# Patient Record
Sex: Female | Born: 1960 | Race: White | Hispanic: No | Marital: Married | Smoking: Former smoker
Health system: Southern US, Community
[De-identification: ages and names within clinical notes are randomized; demographics above are authoritative.]

## PROBLEM LIST (undated history)

## (undated) DIAGNOSIS — F32A Depression, unspecified: Secondary | ICD-10-CM

## (undated) DIAGNOSIS — M5126 Other intervertebral disc displacement, lumbar region: Secondary | ICD-10-CM

## (undated) DIAGNOSIS — F410 Panic disorder [episodic paroxysmal anxiety] without agoraphobia: Secondary | ICD-10-CM

## (undated) DIAGNOSIS — E785 Hyperlipidemia, unspecified: Secondary | ICD-10-CM

## (undated) DIAGNOSIS — E039 Hypothyroidism, unspecified: Secondary | ICD-10-CM

## (undated) DIAGNOSIS — D649 Anemia, unspecified: Secondary | ICD-10-CM

## (undated) DIAGNOSIS — K52831 Collagenous colitis: Secondary | ICD-10-CM

## (undated) DIAGNOSIS — F419 Anxiety disorder, unspecified: Secondary | ICD-10-CM

## (undated) DIAGNOSIS — G4700 Insomnia, unspecified: Secondary | ICD-10-CM

## (undated) DIAGNOSIS — I10 Essential (primary) hypertension: Secondary | ICD-10-CM

## (undated) DIAGNOSIS — R011 Cardiac murmur, unspecified: Secondary | ICD-10-CM

## (undated) HISTORY — DX: Hyperlipidemia, unspecified: E78.5

## (undated) HISTORY — DX: Depression, unspecified: F32.A

## (undated) HISTORY — DX: Collagenous colitis: K52.831

## (undated) HISTORY — DX: Other intervertebral disc displacement, lumbar region: M51.26

## (undated) HISTORY — DX: Anxiety disorder, unspecified: F41.9

## (undated) HISTORY — DX: Essential (primary) hypertension: I10

## (undated) HISTORY — DX: Anemia, unspecified: D64.9

## (undated) HISTORY — DX: Panic disorder (episodic paroxysmal anxiety): F41.0

## (undated) HISTORY — PX: BACK SURGERY: SHX140

## (undated) HISTORY — PX: HERNIA REPAIR: SHX51

## (undated) HISTORY — PX: TUBAL LIGATION: SHX77

## (undated) HISTORY — DX: Hypothyroidism, unspecified: E03.9

## (undated) HISTORY — DX: Insomnia, unspecified: G47.00

## (undated) HISTORY — DX: Cardiac murmur, unspecified: R01.1

---

## 1998-02-17 ENCOUNTER — Other Ambulatory Visit: Admission: RE | Admit: 1998-02-17 | Discharge: 1998-02-17 | Payer: Self-pay | Admitting: Obstetrics and Gynecology

## 1999-02-04 ENCOUNTER — Other Ambulatory Visit: Admission: RE | Admit: 1999-02-04 | Discharge: 1999-02-04 | Payer: Self-pay | Admitting: Internal Medicine

## 1999-02-08 ENCOUNTER — Encounter: Payer: Self-pay | Admitting: Emergency Medicine

## 1999-02-08 ENCOUNTER — Emergency Department (HOSPITAL_COMMUNITY): Admission: EM | Admit: 1999-02-08 | Discharge: 1999-02-08 | Payer: Self-pay | Admitting: Emergency Medicine

## 1999-08-16 ENCOUNTER — Other Ambulatory Visit: Admission: RE | Admit: 1999-08-16 | Discharge: 1999-08-16 | Payer: Self-pay | Admitting: Internal Medicine

## 2000-02-09 ENCOUNTER — Other Ambulatory Visit: Admission: RE | Admit: 2000-02-09 | Discharge: 2000-02-09 | Payer: Self-pay | Admitting: Internal Medicine

## 2000-05-16 ENCOUNTER — Ambulatory Visit (HOSPITAL_COMMUNITY): Admission: RE | Admit: 2000-05-16 | Discharge: 2000-05-16 | Payer: Self-pay | Admitting: Obstetrics and Gynecology

## 2001-02-18 ENCOUNTER — Other Ambulatory Visit: Admission: RE | Admit: 2001-02-18 | Discharge: 2001-02-18 | Payer: Self-pay | Admitting: Internal Medicine

## 2001-02-27 ENCOUNTER — Encounter: Admission: RE | Admit: 2001-02-27 | Discharge: 2001-02-27 | Payer: Self-pay | Admitting: Internal Medicine

## 2001-02-27 ENCOUNTER — Encounter: Payer: Self-pay | Admitting: Internal Medicine

## 2002-02-14 ENCOUNTER — Encounter: Payer: Self-pay | Admitting: Internal Medicine

## 2002-02-14 ENCOUNTER — Encounter: Admission: RE | Admit: 2002-02-14 | Discharge: 2002-02-14 | Payer: Self-pay | Admitting: Internal Medicine

## 2002-03-06 ENCOUNTER — Encounter: Admission: RE | Admit: 2002-03-06 | Discharge: 2002-03-06 | Payer: Self-pay | Admitting: Internal Medicine

## 2002-03-06 ENCOUNTER — Encounter: Payer: Self-pay | Admitting: Internal Medicine

## 2003-03-18 ENCOUNTER — Encounter: Payer: Self-pay | Admitting: Internal Medicine

## 2003-03-18 ENCOUNTER — Encounter: Admission: RE | Admit: 2003-03-18 | Discharge: 2003-03-18 | Payer: Self-pay | Admitting: Internal Medicine

## 2004-03-23 ENCOUNTER — Encounter: Admission: RE | Admit: 2004-03-23 | Discharge: 2004-03-23 | Payer: Self-pay | Admitting: Internal Medicine

## 2005-03-24 ENCOUNTER — Encounter: Admission: RE | Admit: 2005-03-24 | Discharge: 2005-03-24 | Payer: Self-pay | Admitting: Internal Medicine

## 2006-04-06 ENCOUNTER — Encounter: Admission: RE | Admit: 2006-04-06 | Discharge: 2006-04-06 | Payer: Self-pay | Admitting: Internal Medicine

## 2006-07-09 ENCOUNTER — Ambulatory Visit (HOSPITAL_COMMUNITY): Admission: RE | Admit: 2006-07-09 | Discharge: 2006-07-10 | Payer: Self-pay | Admitting: Neurosurgery

## 2007-04-09 ENCOUNTER — Encounter: Admission: RE | Admit: 2007-04-09 | Discharge: 2007-04-09 | Payer: Self-pay | Admitting: Internal Medicine

## 2008-04-09 ENCOUNTER — Encounter: Admission: RE | Admit: 2008-04-09 | Discharge: 2008-04-09 | Payer: Self-pay | Admitting: Internal Medicine

## 2008-07-11 IMAGING — CR DG CHEST 2V
2 series · 2 of 2 positions shown · non-contrast
Comparison: None available.

CLINICAL DATA: HNP and radiculopathy.  
 CHEST - 2 VIEW:

[view not recorded (1 of 2)]
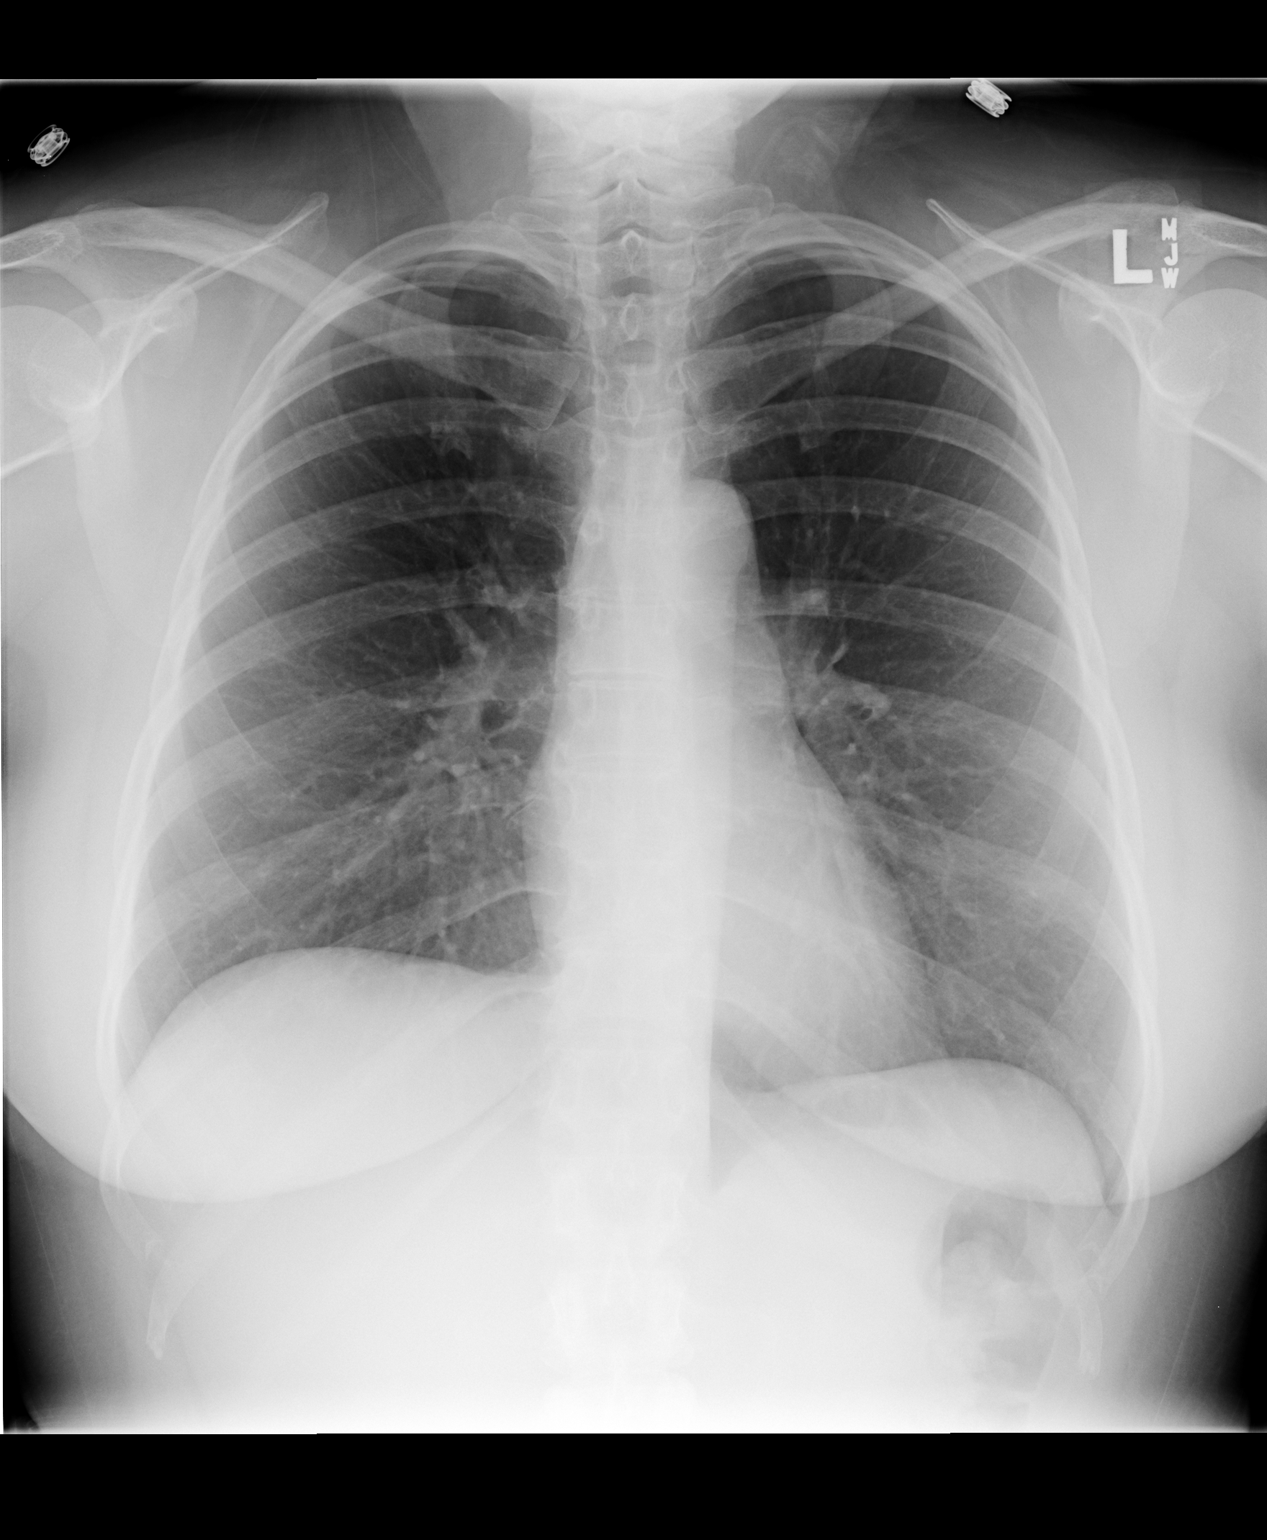

[view not recorded (2 of 2)]
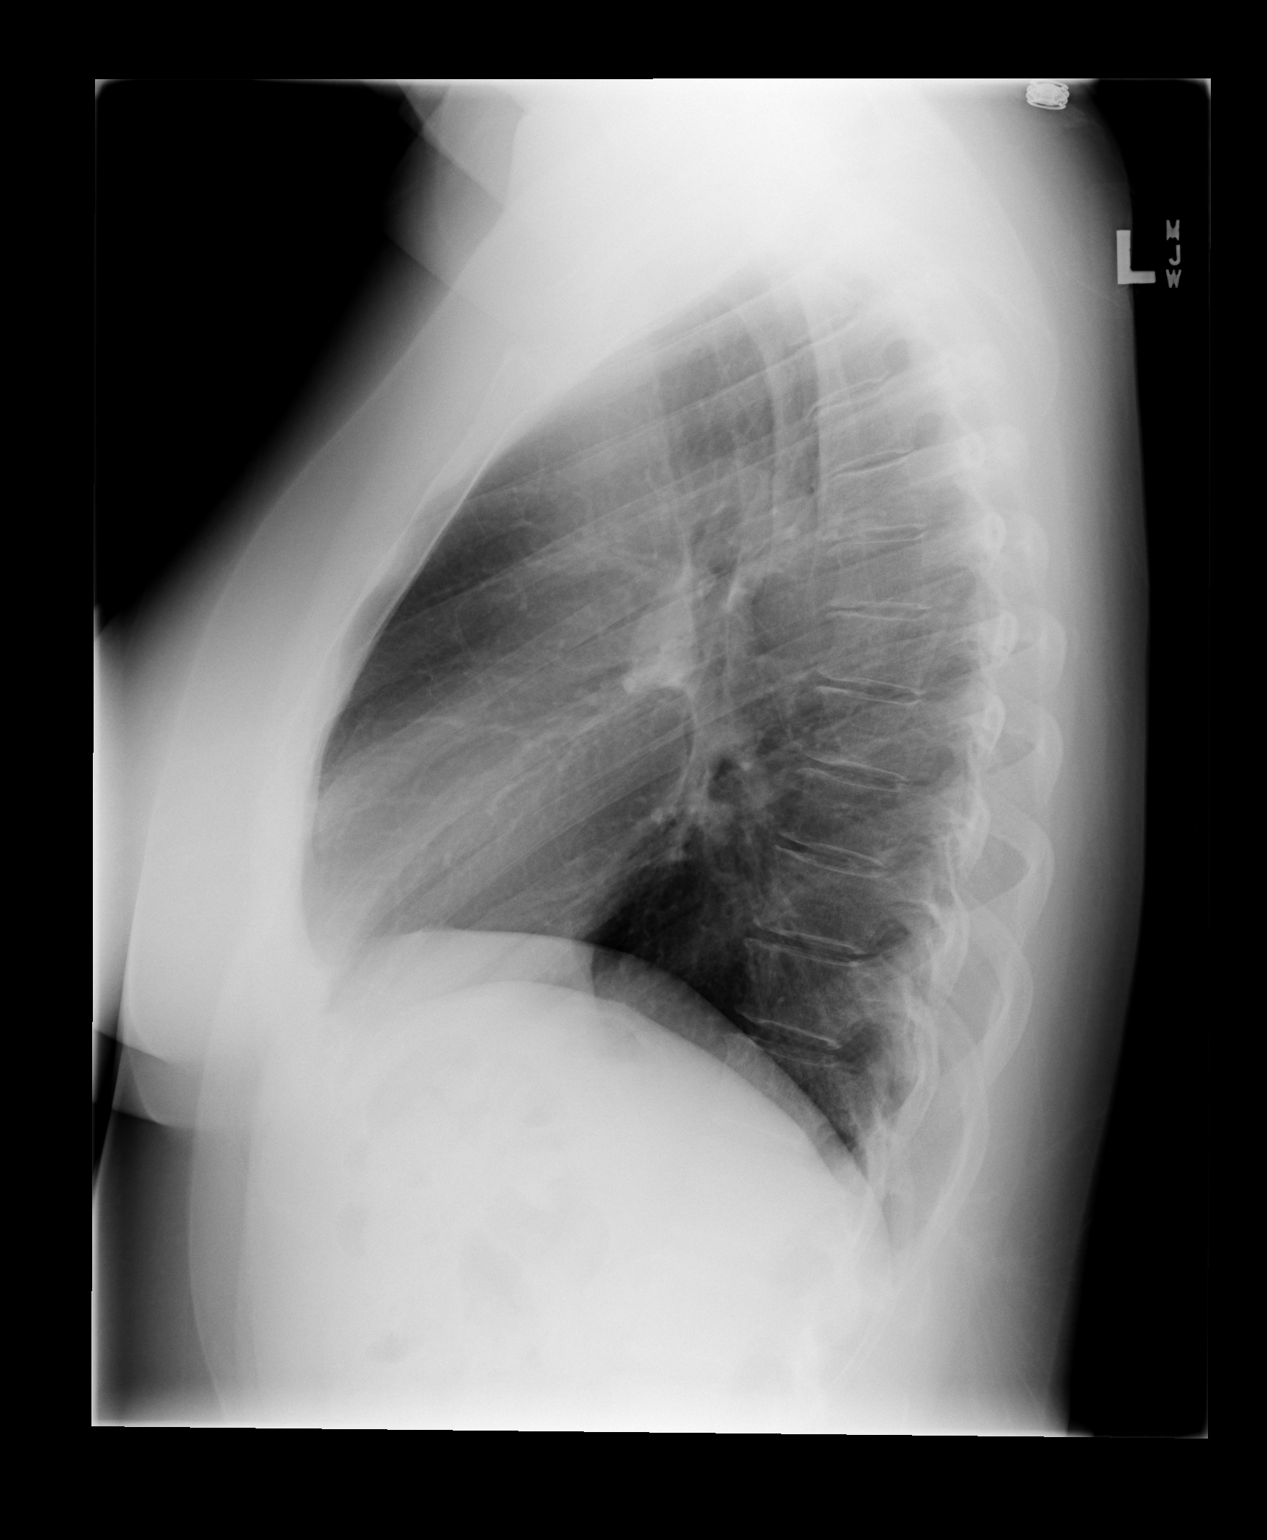

[2 of 2 positions shown; findings below may reference images not displayed]

FINDINGS: The heart size is normal.  There is no effusion or edema.  No airspace opacities are identified.
IMPRESSION: No active disease.

## 2009-04-20 ENCOUNTER — Encounter: Admission: RE | Admit: 2009-04-20 | Discharge: 2009-04-20 | Payer: Self-pay | Admitting: Internal Medicine

## 2010-11-11 NOTE — Op Note (Signed)
[facility]  Patient:    Jenny Yates, Jenny Yates                       MRN: [id number] Proc. Date: 05/16/00 Adm. Date:  [id number] Attending:  Maxie Better                           Operative Report  PREOPERATIVE DIAGNOSIS:         Desires permanent sterilization.  POSTOPERATIVE DIAGNOSIS:        Desires permanent sterilization.  OPERATION:                      Laparoscopic tubal ligation with bipolar cautery.  SURGEON:                        Sheronette A. Cherly Hensen, M.D.  ANESTHESIA:                     General.  INDICATIONS:                    This is a 50 year old G0 married white female with chronic hypertension who now desires permanent sterilization.  Risks and benefits of the procedure have been explained to the patient and consent was signed.  The patient was transferred to the operating room.  DESCRIPTION OF PROCEDURE:       Under adequate general anesthesia, the patient was placed in the dorsal lithotomy position.  Examination under anesthesia revealed a small anteverted uterus.  No adnexal masses could be appreciated. The patient was sterilely prepped and draped in the usual sterile fashion. Hibiclens was used due to the patients allergy to iodine.  Bivalve speculum was placed in the vagina.  Single-tooth tenaculum was placed in the anterior lip of the cervix.  A Kohn cannula was introduced into the cervical os and attached to the tenaculum for manipulation of the uterus.  The bivalve speculum was removed.  Attention was then turned to the abdomen where a small vertical infraumbilical incision was then made.  The Verres needle was introduced.  It was flushed with normal saline.  Opening pressure of 9 was noted.  Three liters of CO2 was insufflated.  The Verres needle was then removed.  A 10 mm trocar was introduced into the abdomen without incident. The patient was placed in Trendelenburg.  A suprapubic incision was then made and under  direct visualization a 5 mm port was introduced.  Using the probe the pelvis was inspected.  No evidence of endometriosis was noted in the anterior posterior cul-de-sac.  Both tubes were normal.  Ovaries were both normal.  The uterus was normal.  Appendix was noted to be normal.  Normal liver edge was noted.  Through the second port, bipolar cautery was then placed and the mid portion of the right fallopian tube was cauterized for a level of 1.5-2.0 cm.  This same procedure was performed on the contralateral side after identifying the fallopian end of each tube bilaterally.  The procedure was then terminated by removing the suprapubic site, deflating the abdomen and removing the infraumbilical site under direct visualization.  The skin was injected with 0.25% Marcaine and proximally using Dermabond glue. The instruments in the vagina were also removed.  Bivalve speculum was reinserted in the vagina.  The cervix was inspected and pressure was put on the tenaculum site and good  hemostasis subsequently noted, all instruments in the vagina were removed.  Specimens were none.  Estimated blood loss was minimal.  Complications were none.  The patient tolerated the procedure well and was transferred to the recovery room in stable condition. DD:  05/16/00 TD:  05/17/00 Job: [id number]

## 2010-11-11 NOTE — Op Note (Signed)
Jenny Yates, Jenny Yates NO.:  [id number]   MEDICAL RECORD NO.:  [id number]          PATIENT TYPE:  OIB   LOCATION:  3014                         FACILITY:  [facility]   PHYSICIAN:  Cristi Loron, M.D.DATE OF BIRTH:  29-Apr-1961   DATE OF PROCEDURE:  07/09/2006  DATE OF DISCHARGE:  07/10/2006                               OPERATIVE REPORT   BRIEF HISTORY AND PHYSICAL:  The patient is a 50 year old white female  who has suffered from back and left flank pain consistent with the left  S1 radiculopathy.  She failed medical management.  Was worked up with a  lumbar MRI, which has demonstrated a herniated disk at L5-S1 on the  left.  I discussed various treatment options with the patient including  surgery.  The patient has been explained the risks, benefits, and  alternative to surgery.  I proceeded with a L5-S1 microdiskectomy.   PREOPERATIVE DIAGNOSES:  1. Left L5-S1 herniated nucleus pulposis.  2. Lumbar radiculopathy lumbago.  3. Degenerative disk disease.   POSTOPERATIVE DIAGNOSES:  1. Left L5-S1 herniated nucleus pulposis.  2. Lumbar radiculopathy lumbago.  3. Degenerative disk disease.   PROCEDURE:  Left L5-S1 microdiskectomy using microdissection.   SURGEON:  Tressie Stalker, M.D.   ASSISTANT:  Hilda Lias, M.D.   ANESTHESIA:  General endotracheal.   ESTIMATED BLOOD LOSS:  Minimal.   SPECIMENS:  None.   DRAINS:  None.   COMPLICATIONS:  None.   DESCRIPTION OF PROCEDURE:  The patient was brought to the operating room  by the anesthesia team.  General endotracheal anesthesia was induced.  The patient was then carefully turned to the prone position on the  Wilson frame.  The lumbosacral region was prepared with a Hibiclens  solution, apparently the patient is allergic to iodine.  Sterile drapes  were applied.  I then injected the area with Marcaine with epinephrine  solution.  I used a scalpel to make a linear midline incision over the  L5-S1  nerve space.  I used electrocautery to perform a left-sided  subperiosteal resection, exposing the left spinous process lamina of L5  near the upper sacral.  I then obtained an intraoperative radiograph to  confirm our location.   We then inserted the [facility] retractor for exposure and then brought  the intraoperative microscope into the field and under its magnification  and illumination completed the microdissection/decompression.  We used a  high speed drill to perform a left L5 laminotomy.  I widened the lamina  with a Kerrison punch and removed the left L5-S1 ligamentum flavum .  Also removed cephalad left S1 lamina.  I performed a foraminotomy about  the left S1 nerve root.  We then used microdissection to dissect the  epidural tissue and then Dr.  Jeral Fruit gently retracted the left S1 nerve  root medially with a erve root  retractor to expose a disk herniation,  which had migrated caudally over the upper S1 vertebral body, it was  compressing the left S1 nerve root into the neuroforamen.  We used  microdissection to free up this disk herniation and removed it  in  several fragments using the pituitary forceps.  We then inspected the L5-  S1 intravertebral disk and there was a hole in the annulus, but there  did not appear to be any impending herniations.  We therefore, did not  enter into the intravertebral disk space.  We obtained hemostasis with  bipolar electrocautery.  I palpated on the ventral surface of the fecal  sac along the exit route  of the left S1 nerve root and noted the neural  structures were well decompressed.  We then removed the [facility]  retractor, then re-approximated the lumbar fascia with a #1 Vicryl  suture, subcutaneous tissue with 2-0 Vicryl sutures, and the skin with  Steri-Strips and Benzoin.  The wound was then covered with a sterile  dressing was applied.  The drapes were removed.  The patient was  subsequently returned to the supine position where  she was extubated by  the anesthesia team and transported to post anesthesia care unit in  stable condition.  All sponge, instrument, and needle counts were  correct at the end of this case.      Cristi Loron, M.D.  Electronically Signed     JDJ/MEDQ  D:  07/09/2006  T:  07/10/2006  Job:  [id number]

## 2015-11-19 DIAGNOSIS — Z1231 Encounter for screening mammogram for malignant neoplasm of breast: Secondary | ICD-10-CM | POA: Diagnosis not present

## 2016-05-01 DIAGNOSIS — Z1283 Encounter for screening for malignant neoplasm of skin: Secondary | ICD-10-CM | POA: Diagnosis not present

## 2016-05-01 DIAGNOSIS — L820 Inflamed seborrheic keratosis: Secondary | ICD-10-CM | POA: Diagnosis not present

## 2016-05-05 DIAGNOSIS — Z1389 Encounter for screening for other disorder: Secondary | ICD-10-CM | POA: Diagnosis not present

## 2016-05-05 DIAGNOSIS — Z6836 Body mass index (BMI) 36.0-36.9, adult: Secondary | ICD-10-CM | POA: Diagnosis not present

## 2016-05-05 DIAGNOSIS — R05 Cough: Secondary | ICD-10-CM | POA: Diagnosis not present

## 2016-05-05 DIAGNOSIS — Z0000 Encounter for general adult medical examination without abnormal findings: Secondary | ICD-10-CM | POA: Diagnosis not present

## 2016-05-05 DIAGNOSIS — R875 Abnormal microbiological findings in specimens from female genital organs: Secondary | ICD-10-CM | POA: Diagnosis not present

## 2016-05-05 DIAGNOSIS — Z5181 Encounter for therapeutic drug level monitoring: Secondary | ICD-10-CM | POA: Diagnosis not present

## 2016-05-05 DIAGNOSIS — Z79899 Other long term (current) drug therapy: Secondary | ICD-10-CM | POA: Diagnosis not present

## 2016-05-05 DIAGNOSIS — E7800 Pure hypercholesterolemia, unspecified: Secondary | ICD-10-CM | POA: Diagnosis not present

## 2016-05-05 DIAGNOSIS — I10 Essential (primary) hypertension: Secondary | ICD-10-CM | POA: Diagnosis not present

## 2016-05-05 DIAGNOSIS — G894 Chronic pain syndrome: Secondary | ICD-10-CM | POA: Diagnosis not present

## 2016-05-05 DIAGNOSIS — E039 Hypothyroidism, unspecified: Secondary | ICD-10-CM | POA: Diagnosis not present

## 2016-05-05 DIAGNOSIS — Z23 Encounter for immunization: Secondary | ICD-10-CM | POA: Diagnosis not present

## 2016-05-05 DIAGNOSIS — Z01419 Encounter for gynecological examination (general) (routine) without abnormal findings: Secondary | ICD-10-CM | POA: Diagnosis not present

## 2016-05-12 DIAGNOSIS — R7309 Other abnormal glucose: Secondary | ICD-10-CM | POA: Diagnosis not present

## 2017-07-02 DIAGNOSIS — R197 Diarrhea, unspecified: Secondary | ICD-10-CM | POA: Diagnosis not present

## 2017-07-02 DIAGNOSIS — E860 Dehydration: Secondary | ICD-10-CM | POA: Diagnosis not present

## 2017-07-02 DIAGNOSIS — R112 Nausea with vomiting, unspecified: Secondary | ICD-10-CM | POA: Diagnosis not present

## 2018-06-12 DIAGNOSIS — T148XXA Other injury of unspecified body region, initial encounter: Secondary | ICD-10-CM | POA: Diagnosis not present

## 2018-06-12 DIAGNOSIS — F411 Generalized anxiety disorder: Secondary | ICD-10-CM | POA: Diagnosis not present

## 2018-06-12 DIAGNOSIS — I10 Essential (primary) hypertension: Secondary | ICD-10-CM | POA: Diagnosis not present

## 2018-06-12 DIAGNOSIS — G894 Chronic pain syndrome: Secondary | ICD-10-CM | POA: Diagnosis not present

## 2018-12-13 DIAGNOSIS — E7800 Pure hypercholesterolemia, unspecified: Secondary | ICD-10-CM | POA: Diagnosis not present

## 2018-12-13 DIAGNOSIS — I10 Essential (primary) hypertension: Secondary | ICD-10-CM | POA: Diagnosis not present

## 2018-12-13 DIAGNOSIS — Z13220 Encounter for screening for lipoid disorders: Secondary | ICD-10-CM | POA: Diagnosis not present

## 2018-12-13 DIAGNOSIS — Z0000 Encounter for general adult medical examination without abnormal findings: Secondary | ICD-10-CM | POA: Diagnosis not present

## 2018-12-13 DIAGNOSIS — Z8742 Personal history of other diseases of the female genital tract: Secondary | ICD-10-CM | POA: Diagnosis not present

## 2018-12-13 DIAGNOSIS — E039 Hypothyroidism, unspecified: Secondary | ICD-10-CM | POA: Diagnosis not present

## 2018-12-13 DIAGNOSIS — Z1331 Encounter for screening for depression: Secondary | ICD-10-CM | POA: Diagnosis not present

## 2018-12-19 DIAGNOSIS — Z1231 Encounter for screening mammogram for malignant neoplasm of breast: Secondary | ICD-10-CM | POA: Diagnosis not present

## 2019-02-26 DIAGNOSIS — I10 Essential (primary) hypertension: Secondary | ICD-10-CM | POA: Diagnosis not present

## 2019-05-29 DIAGNOSIS — I10 Essential (primary) hypertension: Secondary | ICD-10-CM | POA: Diagnosis not present

## 2019-05-29 DIAGNOSIS — E7800 Pure hypercholesterolemia, unspecified: Secondary | ICD-10-CM | POA: Diagnosis not present

## 2019-05-29 DIAGNOSIS — G894 Chronic pain syndrome: Secondary | ICD-10-CM | POA: Diagnosis not present

## 2019-06-06 DIAGNOSIS — K6289 Other specified diseases of anus and rectum: Secondary | ICD-10-CM | POA: Diagnosis not present

## 2019-06-06 DIAGNOSIS — Z1211 Encounter for screening for malignant neoplasm of colon: Secondary | ICD-10-CM | POA: Diagnosis not present

## 2019-06-06 DIAGNOSIS — D122 Benign neoplasm of ascending colon: Secondary | ICD-10-CM | POA: Diagnosis not present

## 2019-06-06 DIAGNOSIS — K621 Rectal polyp: Secondary | ICD-10-CM | POA: Diagnosis not present

## 2019-06-06 DIAGNOSIS — K635 Polyp of colon: Secondary | ICD-10-CM | POA: Diagnosis not present

## 2021-09-14 DIAGNOSIS — D225 Melanocytic nevi of trunk: Secondary | ICD-10-CM | POA: Diagnosis not present

## 2021-09-14 DIAGNOSIS — L570 Actinic keratosis: Secondary | ICD-10-CM | POA: Diagnosis not present

## 2021-09-14 DIAGNOSIS — D2239 Melanocytic nevi of other parts of face: Secondary | ICD-10-CM | POA: Diagnosis not present

## 2021-09-14 DIAGNOSIS — L820 Inflamed seborrheic keratosis: Secondary | ICD-10-CM | POA: Diagnosis not present

## 2021-09-14 DIAGNOSIS — L814 Other melanin hyperpigmentation: Secondary | ICD-10-CM | POA: Diagnosis not present

## 2021-10-03 DIAGNOSIS — I10 Essential (primary) hypertension: Secondary | ICD-10-CM | POA: Diagnosis not present

## 2021-10-03 DIAGNOSIS — E7800 Pure hypercholesterolemia, unspecified: Secondary | ICD-10-CM | POA: Diagnosis not present

## 2021-10-06 DIAGNOSIS — L0390 Cellulitis, unspecified: Secondary | ICD-10-CM | POA: Diagnosis not present

## 2021-10-10 DIAGNOSIS — L0390 Cellulitis, unspecified: Secondary | ICD-10-CM | POA: Diagnosis not present

## 2022-01-19 DIAGNOSIS — Z973 Presence of spectacles and contact lenses: Secondary | ICD-10-CM | POA: Diagnosis not present

## 2022-01-19 DIAGNOSIS — H25813 Combined forms of age-related cataract, bilateral: Secondary | ICD-10-CM | POA: Diagnosis not present

## 2022-01-19 DIAGNOSIS — H4322 Crystalline deposits in vitreous body, left eye: Secondary | ICD-10-CM | POA: Diagnosis not present

## 2022-01-19 DIAGNOSIS — H02831 Dermatochalasis of right upper eyelid: Secondary | ICD-10-CM | POA: Diagnosis not present

## 2022-01-19 DIAGNOSIS — H02834 Dermatochalasis of left upper eyelid: Secondary | ICD-10-CM | POA: Diagnosis not present

## 2022-01-23 DIAGNOSIS — S81801A Unspecified open wound, right lower leg, initial encounter: Secondary | ICD-10-CM | POA: Diagnosis not present

## 2022-01-23 DIAGNOSIS — E7800 Pure hypercholesterolemia, unspecified: Secondary | ICD-10-CM | POA: Diagnosis not present

## 2022-01-23 DIAGNOSIS — I10 Essential (primary) hypertension: Secondary | ICD-10-CM | POA: Diagnosis not present

## 2022-04-24 DIAGNOSIS — Z1231 Encounter for screening mammogram for malignant neoplasm of breast: Secondary | ICD-10-CM | POA: Diagnosis not present

## 2022-04-24 DIAGNOSIS — I10 Essential (primary) hypertension: Secondary | ICD-10-CM | POA: Diagnosis not present

## 2022-04-24 DIAGNOSIS — Z6841 Body Mass Index (BMI) 40.0 and over, adult: Secondary | ICD-10-CM | POA: Diagnosis not present

## 2022-04-24 DIAGNOSIS — E7800 Pure hypercholesterolemia, unspecified: Secondary | ICD-10-CM | POA: Diagnosis not present

## 2022-04-24 DIAGNOSIS — L820 Inflamed seborrheic keratosis: Secondary | ICD-10-CM | POA: Diagnosis not present

## 2022-04-24 DIAGNOSIS — E039 Hypothyroidism, unspecified: Secondary | ICD-10-CM | POA: Diagnosis not present

## 2022-04-24 DIAGNOSIS — Z0000 Encounter for general adult medical examination without abnormal findings: Secondary | ICD-10-CM | POA: Diagnosis not present

## 2022-05-08 ENCOUNTER — Telehealth: Payer: Self-pay | Admitting: Internal Medicine

## 2022-05-08 NOTE — Telephone Encounter (Signed)
Husband called and stated he got a letter in the mail saying this med required a prior auth. Never had to do this before. Wasn't sure if this process had been started or not.

## 2022-07-03 ENCOUNTER — Other Ambulatory Visit: Payer: Self-pay

## 2022-07-03 MED ORDER — NEBIVOLOL HCL 10 MG PO TABS: 20.0000 mg | ORAL_TABLET | Freq: Every day | ORAL | 1 refills | Status: DC

## 2022-07-24 ENCOUNTER — Encounter: Payer: Self-pay | Admitting: Internal Medicine

## 2022-07-24 ENCOUNTER — Ambulatory Visit: Payer: 59 | Admitting: Internal Medicine

## 2022-07-24 VITALS — BP 148/80 | HR 70 | Temp 97.9°F | Resp 16 | Ht <= 58 in | Wt 199.6 lb

## 2022-07-24 DIAGNOSIS — E785 Hyperlipidemia, unspecified: Secondary | ICD-10-CM | POA: Diagnosis not present

## 2022-07-24 DIAGNOSIS — G894 Chronic pain syndrome: Secondary | ICD-10-CM

## 2022-07-24 DIAGNOSIS — I10 Essential (primary) hypertension: Secondary | ICD-10-CM | POA: Insufficient documentation

## 2022-07-24 MED ORDER — BUDESONIDE 3 MG PO CPEP: 9.0000 mg | ORAL_CAPSULE | Freq: Every day | ORAL | 3 refills | Status: DC

## 2022-07-24 MED ORDER — HYDROCHLOROTHIAZIDE 25 MG PO TABS: 25.0000 mg | ORAL_TABLET | Freq: Every day | ORAL | 3 refills | Status: DC

## 2022-07-24 MED ORDER — HYDROCODONE-ACETAMINOPHEN 5-325 MG PO TABS: 1.0000 | ORAL_TABLET | Freq: Four times a day (QID) | ORAL | 0 refills | Status: DC | PRN

## 2022-07-24 MED ORDER — TELMISARTAN 40 MG PO TABS: 40.0000 mg | ORAL_TABLET | Freq: Every day | ORAL | 3 refills | Status: DC

## 2022-07-24 NOTE — Assessment & Plan Note (Signed)
I reviewed her [place] controlled substance registry.  She had problems getting her hydrocodone/APAP refilled and states that [facility] was supposed to contact us for refills.  We will refill her meds today.

## 2022-07-24 NOTE — Assessment & Plan Note (Signed)
Her labs from 03/2022 showed an improvement in her FLP.  We will repeat this today and decide if we need to go up on her dose of lipitor.

## 2022-07-24 NOTE — Assessment & Plan Note (Signed)
Her BP is elevated today.  She forgot to take her meds this AM but took them late.  I am going to change her chlorathalidone to HCTZ at this time.

## 2022-07-24 NOTE — Progress Notes (Signed)
Office Visit  Subjective   Patient ID: Jenny Yates   DOB: 1961/06/03   Age: 62 y.o.   MRN: [id number]   Chief Complaint Chief Complaint  Patient presents with   Follow-up    Hypertension      History of Present Illness Jenny Yates returns today for routine followup on her cholesterol. On her last visit, her choelsterol was very elevated and we started her on lipitor daily.  Overall, she states she is doing well and is without any complaints or problems at this time. IN the past, we held off on a statin due to her history of elevated LFT's.   She specifically denies abdominal pain, nausea, vomiting, diarrhea, and fatigue.  She is having some minor myalgias of her left lower leg and her hips.  She remains on Lipitor 40mg  qhs.  She is fasting in anticipation for labs today.    The patient is a 62 year old Caucasian/White female who presents for a follow-up evaluation of hypertension.  The patient is stating that her chlorthalidone is not covered by her insurance and she wants to change this to HCTZ.  They also will not pay for her benicar but they will pay for telmisartan.  Over the interim, she has not had any problems.  I have tried her on Norvasc in the past but she developed edema where this medicine was stopped.  Her edema resolved and she continues to wear compression hose.  The patient has been checking her blood pressure at home and her systolic BP is running 120-140's range.  The patient's current medications include: Benicar 20mg  po BID, Bystolic 20mg  daily, chlorathalidone 25mg  daily, and hydralazine 50mg  BID.  Otherwise the patient denies any headache, visual changes, dizziness, lightheadness, chest pain, shortness of breath, weakness/numbness, and edema. She reports there have been no other symptoms noted.    Jenny Yates has a history of of probable osteoarthritis with joint pain and swelling in the hands, knees, ankles and her shoulders.  We did a lab workup which was essentially  normal but her RMSF serology was abnormal and we treated her at that time.  She was also having fatigue as well as some generalized mild weakness.  She states all of her joints were hurting that is described as a dull constant aching pain that was moderate to severe at times.  She did have a tick bite on 10/30/2019 which was removed.  She denies any fevers, chills, rash, headaches or other problems at that time.  I gave her doxycycline as described above and her above symptoms resolved.  She never did go to rheumatology.  Again, [name] where she had a ruptured lumbar disc.  She attributes this to a combination of weight training and a slip and fall in her bathtub in 2007.  She was seen by neurosurgery with Dr. and ultimately underwent a left L5-S1 microdiscectomy in 06/2006.  Since then she has had intermittent chronic low back pain that does not affect her daily function.  She does use meloxicam for her pain.  She also remains on hydrocodone/APAP 5/325 where she uses this 2-3 times per week. She denies any new weakness/numbness or loss of bowel/bladder function.        Past Medical History Past Medical History:  Diagnosis Date   Anemia    Anxiety    Collagenous colitis    Depression    Hyperlipidemia    Hypertension  Hypothyroidism    Lumbar herniated disc    Panic disorder      Allergies Allergies  Allergen Reactions   Azithromycin    Codeine    Epinephrine    Iodine      Medications  Current Outpatient Medications:    ALPRAZolam (XANAX) 0.5 MG tablet, Take 0.5 mg by mouth 3 (three) times daily as needed., Disp: , Rfl:    atorvastatin (LIPITOR) 40 MG tablet, Take 40 mg by mouth daily., Disp: , Rfl:    budesonide (ENTOCORT EC) 3 MG 24 hr capsule, Take 9 mg by mouth daily., Disp: , Rfl:    budesonide (ENTOCORT EC) 3 MG 24 hr capsule, Take 3 capsules (9 mg total) by mouth daily., Disp: 270 capsule, Rfl: 3   diazepam (VALIUM) 5 MG tablet, Take 5  mg by mouth 2 (two) times daily as needed., Disp: , Rfl:    hydrALAZINE (APRESOLINE) 50 MG tablet, Take 50 mg by mouth 2 (two) times daily., Disp: , Rfl:    hydrochlorothiazide (HYDRODIURIL) 25 MG tablet, Take 1 tablet (25 mg total) by mouth daily., Disp: 90 tablet, Rfl: 3   HYDROcodone-acetaminophen (NORCO/VICODIN) 5-325 MG tablet, Take 1 tablet by mouth every 6 (six) hours as needed for moderate pain., Disp: 20 tablet, Rfl: 0   levothyroxine (SYNTHROID) 100 MCG tablet, Take 100 mcg by mouth daily., Disp: , Rfl:    meloxicam (MOBIC) 15 MG tablet, Take 15 mg by mouth daily., Disp: , Rfl:    telmisartan (MICARDIS) 40 MG tablet, Take 1 tablet (40 mg total) by mouth daily., Disp: 90 tablet, Rfl: 3   nebivolol (BYSTOLIC) 10 MG tablet, Take 2 tablets (20 mg total) by mouth daily., Disp: 180 tablet, Rfl: 1   olmesartan (BENICAR) 20 MG tablet, Take 20 mg by mouth in the morning and at bedtime., Disp: , Rfl:    Review of Systems Review of Systems  Constitutional:  Negative for chills and fever.  Eyes:  Negative for blurred vision and double vision.  Respiratory:  Negative for cough and shortness of breath.   Cardiovascular:  Positive for leg swelling. Negative for chest pain and palpitations.  Gastrointestinal:  Negative for abdominal pain, constipation, diarrhea, nausea and vomiting.  Musculoskeletal:  Positive for myalgias.  Skin:  Negative for itching and rash.  Neurological:  Negative for dizziness, weakness and headaches.       Objective:    Vitals BP (!) 148/80   Pulse 70   Temp 97.9 F (36.6 C)   Resp 16   Ht 4\' 10"  (1.473 m)   Wt 199 lb 9.6 oz (90.5 kg)   SpO2 99%   BMI 41.72 kg/m    Physical Examination Physical Exam Constitutional:      Appearance: Normal appearance. She is not ill-appearing.  Cardiovascular:     Rate and Rhythm: Normal rate and regular rhythm.     Pulses: Normal pulses.     Heart sounds: No murmur heard.    No friction rub. No gallop.  Pulmonary:      Effort: Pulmonary effort is normal. No respiratory distress.     Breath sounds: No wheezing, rhonchi or rales.  Abdominal:     General: Bowel sounds are normal. There is no distension.     Palpations: Abdomen is soft.     Tenderness: There is no abdominal tenderness.  Musculoskeletal:     Right lower leg: No edema.     Left lower leg: No edema.  Skin:  General: Skin is warm and dry.     Findings: No rash.  Neurological:     General: No focal deficit present.     Mental Status: She is alert and oriented to person, place, and time.  Psychiatric:        Mood and Affect: Mood normal.        Behavior: Behavior normal.        Assessment & Plan:   Essential hypertension Her BP is elevated today.  She forgot to take her meds this AM but took them late.  I am going to change her chlorathalidone to HCTZ at this time.  Chronic pain syndrome I reviewed her [place] controlled substance registry.  She had problems getting her hydrocodone/APAP refilled and states that [facility] was supposed to contact us for refills.  We will refill her meds today.  Hyperlipidemia Her labs from 03/2022 showed an improvement in her FLP.  We will repeat this today and decide if we need to go up on her dose of lipitor.    Return in about 3 months (around 10/23/2022).   Townsend Roger, MD

## 2022-07-25 LAB — LIPID PANEL
Chol/HDL Ratio: 2.4 ratio (ref 0.0–4.4)
Cholesterol, Total: 261 mg/dL — ABNORMAL HIGH (ref 100–199)
HDL: 110 mg/dL (ref >39)
LDL Chol Calc (NIH): 117 mg/dL — ABNORMAL HIGH (ref 0–99)
Triglycerides: 201 mg/dL — ABNORMAL HIGH (ref 0–149)
VLDL Cholesterol Cal: 34 mg/dL (ref 5–40)

## 2022-07-26 ENCOUNTER — Other Ambulatory Visit: Payer: Self-pay | Admitting: Internal Medicine

## 2022-08-18 ENCOUNTER — Other Ambulatory Visit: Payer: Self-pay

## 2022-08-18 MED ORDER — TELMISARTAN 40 MG PO TABS: 40.0000 mg | ORAL_TABLET | Freq: Every day | ORAL | 3 refills | Status: DC

## 2022-08-18 MED ORDER — HYDROCHLOROTHIAZIDE 25 MG PO TABS: 25.0000 mg | ORAL_TABLET | Freq: Every day | ORAL | 1 refills | Status: DC

## 2022-08-22 ENCOUNTER — Other Ambulatory Visit: Payer: Self-pay

## 2022-08-22 ENCOUNTER — Telehealth: Payer: Self-pay

## 2022-08-22 MED ORDER — ATORVASTATIN CALCIUM 40 MG PO TABS: 80.0000 mg | ORAL_TABLET | Freq: Every day | ORAL | 1 refills | Status: DC

## 2022-08-22 MED ORDER — TELMISARTAN 40 MG PO TABS: 40.0000 mg | ORAL_TABLET | Freq: Every day | ORAL | 3 refills | Status: DC

## 2022-08-22 MED ORDER — HYDROCHLOROTHIAZIDE 25 MG PO TABS: 25.0000 mg | ORAL_TABLET | Freq: Every day | ORAL | 1 refills | Status: DC

## 2022-08-22 NOTE — Telephone Encounter (Signed)
-----   Message from Townsend Roger, MD sent at 08/20/2022  9:26 PM EST ----- I want her to increase her lipitor to 49m qhs.

## 2022-08-22 NOTE — Progress Notes (Signed)
Med change per Dr. Nona Dell

## 2022-08-22 NOTE — Telephone Encounter (Signed)
Pt notified of lab results

## 2022-08-24 ENCOUNTER — Other Ambulatory Visit: Payer: Self-pay

## 2022-08-24 MED ORDER — ATORVASTATIN CALCIUM 40 MG PO TABS: 80.0000 mg | ORAL_TABLET | Freq: Every day | ORAL | 1 refills | Status: DC

## 2022-08-31 ENCOUNTER — Other Ambulatory Visit: Payer: Self-pay | Admitting: Internal Medicine

## 2022-09-02 ENCOUNTER — Other Ambulatory Visit: Payer: Self-pay | Admitting: Internal Medicine

## 2022-09-06 ENCOUNTER — Other Ambulatory Visit: Payer: Self-pay | Admitting: Internal Medicine

## 2022-09-06 MED ORDER — ALPRAZOLAM 0.5 MG PO TABS: 0.5000 mg | ORAL_TABLET | Freq: Three times a day (TID) | ORAL | 2 refills | Status: DC | PRN

## 2022-10-14 ENCOUNTER — Other Ambulatory Visit: Payer: Self-pay | Admitting: Internal Medicine

## 2022-10-17 ENCOUNTER — Other Ambulatory Visit: Payer: Self-pay

## 2022-10-17 MED ORDER — HYDROCHLOROTHIAZIDE 25 MG PO TABS: 25.0000 mg | ORAL_TABLET | Freq: Every day | ORAL | 1 refills | Status: DC

## 2022-10-19 DIAGNOSIS — Z008 Encounter for other general examination: Secondary | ICD-10-CM | POA: Diagnosis not present

## 2022-10-19 DIAGNOSIS — E039 Hypothyroidism, unspecified: Secondary | ICD-10-CM | POA: Diagnosis not present

## 2022-10-19 DIAGNOSIS — Z8249 Family history of ischemic heart disease and other diseases of the circulatory system: Secondary | ICD-10-CM | POA: Diagnosis not present

## 2022-10-19 DIAGNOSIS — K5090 Crohn's disease, unspecified, without complications: Secondary | ICD-10-CM | POA: Diagnosis not present

## 2022-10-19 DIAGNOSIS — I10 Essential (primary) hypertension: Secondary | ICD-10-CM | POA: Diagnosis not present

## 2022-10-19 DIAGNOSIS — E785 Hyperlipidemia, unspecified: Secondary | ICD-10-CM | POA: Diagnosis not present

## 2022-10-19 DIAGNOSIS — Z87891 Personal history of nicotine dependence: Secondary | ICD-10-CM | POA: Diagnosis not present

## 2022-10-19 DIAGNOSIS — Z823 Family history of stroke: Secondary | ICD-10-CM | POA: Diagnosis not present

## 2022-10-19 DIAGNOSIS — H269 Unspecified cataract: Secondary | ICD-10-CM | POA: Diagnosis not present

## 2022-10-19 DIAGNOSIS — M1990 Unspecified osteoarthritis, unspecified site: Secondary | ICD-10-CM | POA: Diagnosis not present

## 2022-10-19 DIAGNOSIS — Z85828 Personal history of other malignant neoplasm of skin: Secondary | ICD-10-CM | POA: Diagnosis not present

## 2022-10-19 DIAGNOSIS — Z809 Family history of malignant neoplasm, unspecified: Secondary | ICD-10-CM | POA: Diagnosis not present

## 2022-10-31 DIAGNOSIS — C44529 Squamous cell carcinoma of skin of other part of trunk: Secondary | ICD-10-CM | POA: Diagnosis not present

## 2022-11-05 ENCOUNTER — Other Ambulatory Visit: Payer: Self-pay | Admitting: Internal Medicine

## 2022-11-06 ENCOUNTER — Ambulatory Visit: Payer: 59 | Admitting: Internal Medicine

## 2022-11-06 ENCOUNTER — Encounter: Payer: Self-pay | Admitting: Internal Medicine

## 2022-11-06 VITALS — BP 158/98 | HR 71 | Temp 99.1°F | Resp 16 | Ht <= 58 in | Wt 200.4 lb

## 2022-11-06 DIAGNOSIS — I10 Essential (primary) hypertension: Secondary | ICD-10-CM | POA: Diagnosis not present

## 2022-11-06 DIAGNOSIS — E7800 Pure hypercholesterolemia, unspecified: Secondary | ICD-10-CM | POA: Insufficient documentation

## 2022-11-06 DIAGNOSIS — G894 Chronic pain syndrome: Secondary | ICD-10-CM

## 2022-11-06 MED ORDER — TELMISARTAN 80 MG PO TABS: 80.0000 mg | ORAL_TABLET | Freq: Every day | ORAL | 3 refills | Status: DC

## 2022-11-06 MED ORDER — LEVOTHYROXINE SODIUM 100 MCG PO TABS: 100.0000 ug | ORAL_TABLET | Freq: Every day | ORAL | 3 refills | Status: DC

## 2022-11-06 MED ORDER — HYDROCODONE-ACETAMINOPHEN 5-325 MG PO TABS: 1.0000 | ORAL_TABLET | Freq: Four times a day (QID) | ORAL | 0 refills | Status: DC | PRN

## 2022-11-06 NOTE — Assessment & Plan Note (Signed)
Her BP is not controlled.   We will increase her telmisartan from 40mg  to 80mg  daily.

## 2022-11-06 NOTE — Assessment & Plan Note (Signed)
Her lipitor was increased from 40mg  to 80mg  and we will recheck her FLP today.

## 2022-11-06 NOTE — Addendum Note (Signed)
Addended by: Crist Fat on: 11/06/2022 10:46 AM   Modules accepted: Orders

## 2022-11-06 NOTE — Progress Notes (Signed)
Office Visit  Subjective   Patient ID: [name]   DOB: 03-Sep-1960   Age: 62 y.o.   MRN: [id number]   Chief Complaint Chief Complaint  Patient presents with   Follow-up     History of Present Illness Jenny Yates returns today for routine followup on her cholesterol. On her last visit, her choelsterol was not controlled and we increased her lipitor from 40mg  to 80mg  daily.  Overall, she states she is doing well and is without any complaints or problems at this time. In the past, we held off on a statin due to her history of elevated LFT's.   She specifically denies abdominal pain, nausea, vomiting, diarrhea, and fatigue.  She remains on Lipitor 80mg  qhs.  She is fasting in anticipation for labs today.    The patient is a 62 year old Caucasian/White female who presents for a follow-up evaluation of hypertension.  On her last visit, her BP was elevated due to taking her BP meds late.  Today, her BP is elevated as well.  We did switch her chlorthalidone back to HCTZ as it was not covered by her insurance .  They also will not pay for her benicar but they will pay for telmisartan.  Over the interim, she has not had any problems.  I have tried her on Norvasc in the past but she developed edema where this medicine was stopped.  Her edema resolved and she continues to wear compression hose.  The patient has been checking her blood pressure at home and her systolic BP is running 140's range.  The patient's current medications include: telmisartan 40mg  daily, Bystolic 20mg  daily, HCTZ 25mg  daily, and hydralazine 50mg  BID.  Otherwise the patient denies any headache, visual changes, dizziness, lightheadness, chest pain, shortness of breath, weakness/numbness, and edema. She reports there have been no other symptoms noted.     Jenny Yates also returns for follow up of her chronic pain with her history of of probable osteoarthritis with joint pain and swelling in the hands, knees, ankles and her shoulders.  We  did a lab workup which was essentially normal but her RMSF serology was abnormal and we treated her at that time.  She was also having fatigue as well as some generalized mild weakness.  She states all of her joints were hurting that is described as a dull constant aching pain that was moderate to severe at times.  She did have a tick bite on 10/30/2019 which was removed.  She denies any fevers, chills, rash, headaches or other problems at that time.  I gave her doxycycline as described above and her above symptoms resolved.  She never did go to rheumatology.  Again, [name] where she had a ruptured lumbar disc.  She attributes this to a combination of weight training and a slip and fall in her bathtub in 2007.  She was seen by neurosurgery with Dr. Lovell Sheehan and ultimately underwent a left L5-S1 microdiscectomy in 06/2006.  Since then she has had intermittent chronic low back pain that does not affect her daily function.  She does use meloxicam for her pain.  She also remains on hydrocodone/APAP 5/325 where she uses this 2-3 times per week. She denies any new weakness/numbness or loss of bowel/bladder function.      Past Medical History Past Medical History:  Diagnosis Date   Anemia    Anxiety    Collagenous colitis    Depression  Hyperlipidemia    Hypertension    Hypothyroidism    Lumbar herniated disc    Panic disorder      Allergies Allergies  Allergen Reactions   Azithromycin    Codeine    Epinephrine    Iodine      Medications  Current Outpatient Medications:    ALPRAZolam (XANAX) 0.5 MG tablet, Take 1 tablet (0.5 mg total) by mouth 3 (three) times daily as needed., Disp: 90 tablet, Rfl: 2   atorvastatin (LIPITOR) 40 MG tablet, Take 2 tablets (80 mg total) by mouth at bedtime., Disp: 180 tablet, Rfl: 1   budesonide (ENTOCORT EC) 3 MG 24 hr capsule, Take 9 mg by mouth daily., Disp: , Rfl:    budesonide (ENTOCORT EC) 3 MG 24 hr capsule, Take 3 capsules  (9 mg total) by mouth daily., Disp: 270 capsule, Rfl: 3   diazepam (VALIUM) 5 MG tablet, Take 5 mg by mouth 2 (two) times daily as needed., Disp: , Rfl:    hydrALAZINE (APRESOLINE) 50 MG tablet, TAKE 1 TABLET 2 TIMES DAILYWITH FOOD, Disp: 180 tablet, Rfl: 1   hydrochlorothiazide (HYDRODIURIL) 25 MG tablet, Take 1 tablet (25 mg total) by mouth daily., Disp: 90 tablet, Rfl: 1   HYDROcodone-acetaminophen (NORCO/VICODIN) 5-325 MG tablet, Take 1 tablet by mouth every 6 (six) hours as needed for moderate pain., Disp: 20 tablet, Rfl: 0   levothyroxine (SYNTHROID) 100 MCG tablet, Take 1 tablet by mouth once daily, Disp: 90 tablet, Rfl: 0   meloxicam (MOBIC) 15 MG tablet, TAKE 1 TABLET ONCE DAILY, Disp: 90 tablet, Rfl: 1   nebivolol (BYSTOLIC) 10 MG tablet, Take 2 tablets (20 mg total) by mouth daily., Disp: 180 tablet, Rfl: 1   Review of Systems Review of Systems  Constitutional:  Negative for chills, fever and malaise/fatigue.  Eyes:  Negative for blurred vision and double vision.  Respiratory:  Negative for cough and shortness of breath.   Cardiovascular:  Negative for chest pain, palpitations and leg swelling.  Gastrointestinal:  Negative for abdominal pain, constipation, diarrhea, nausea and vomiting.  Genitourinary:  Negative for frequency.  Musculoskeletal:  Negative for myalgias.  Skin:  Negative for itching and rash.  Neurological:  Negative for dizziness, weakness and headaches.       Objective:    Vitals BP (!) 158/98   Pulse 71   Temp 99.1 F (37.3 C)   Resp 16   Ht 4\' 10"  (1.473 m)   Wt 200 lb 6.4 oz (90.9 kg)   SpO2 97%   BMI 41.88 kg/m    Physical Examination Physical Exam Constitutional:      Appearance: Normal appearance. She is not ill-appearing.  Neck:     Vascular: No carotid bruit.  Cardiovascular:     Rate and Rhythm: Normal rate and regular rhythm.     Pulses: Normal pulses.     Heart sounds: No murmur heard.    No friction rub. No gallop.  Pulmonary:      Effort: Pulmonary effort is normal. No respiratory distress.     Breath sounds: No wheezing, rhonchi or rales.  Abdominal:     General: Bowel sounds are normal. There is no distension.     Palpations: Abdomen is soft.     Tenderness: There is no abdominal tenderness.  Musculoskeletal:     Right lower leg: No edema.     Left lower leg: No edema.  Skin:    General: Skin is warm and dry.  Findings: No rash.  Neurological:     General: No focal deficit present.     Mental Status: She is alert and oriented to person, place, and time.  Psychiatric:        Mood and Affect: Mood normal.        Behavior: Behavior normal.        Assessment & Plan:   Essential hypertension Her BP is not controlled.   We will increase her telmisartan from 40mg  to 80mg  daily.    Chronic pain syndrome We have reviewed her [place] controlled substance registry.  We will refill her pain meds for 3 months.  Hypercholesterolemia Her lipitor was increased from 40mg  to 80mg  and we will recheck her FLP today.    Return in about 3 months (around 02/06/2023).   Crist Fat, MD

## 2022-11-06 NOTE — Assessment & Plan Note (Signed)
We have reviewed her [place] controlled substance registry.  We will refill her pain meds for 3 months.

## 2022-11-07 LAB — LIPID PANEL
Chol/HDL Ratio: 2.3 ratio (ref 0.0–4.4)
Cholesterol, Total: 241 mg/dL — ABNORMAL HIGH (ref 100–199)
HDL: 105 mg/dL (ref >39)
LDL Chol Calc (NIH): 98 mg/dL (ref 0–99)
Triglycerides: 231 mg/dL — ABNORMAL HIGH (ref 0–149)
VLDL Cholesterol Cal: 38 mg/dL (ref 5–40)

## 2022-11-07 LAB — CMP14 + ANION GAP
ALT: 44 IU/L — ABNORMAL HIGH (ref 0–32)
AST: 30 IU/L (ref 0–40)
Albumin/Globulin Ratio: 1.8 (ref 1.2–2.2)
Albumin: 4.2 g/dL (ref 3.9–4.9)
Alkaline Phosphatase: 87 IU/L (ref 44–121)
Anion Gap: 17 mmol/L (ref 10.0–18.0)
BUN/Creatinine Ratio: 28 (ref 12–28)
BUN: 22 mg/dL (ref 8–27)
Bilirubin Total: 0.5 mg/dL (ref 0.0–1.2)
CO2: 23 mmol/L (ref 20–29)
Calcium: 9.9 mg/dL (ref 8.7–10.3)
Chloride: 95 mmol/L — ABNORMAL LOW (ref 96–106)
Creatinine, Ser: 0.8 mg/dL (ref 0.57–1.00)
Globulin, Total: 2.3 g/dL (ref 1.5–4.5)
Glucose: 101 mg/dL — ABNORMAL HIGH (ref 70–99)
Potassium: 4.5 mmol/L (ref 3.5–5.2)
Sodium: 135 mmol/L (ref 134–144)
Total Protein: 6.5 g/dL (ref 6.0–8.5)
eGFR: 84 mL/min/{1.73_m2} (ref >59)

## 2022-11-07 NOTE — Telephone Encounter (Signed)
Already sent in by provider

## 2022-11-10 ENCOUNTER — Other Ambulatory Visit: Payer: Self-pay

## 2022-11-10 MED ORDER — ROSUVASTATIN CALCIUM 20 MG PO TABS: 20.0000 mg | ORAL_TABLET | Freq: Every day | ORAL | 1 refills | Status: DC

## 2022-11-10 MED ORDER — ATORVASTATIN CALCIUM 80 MG PO TABS: 80.0000 mg | ORAL_TABLET | Freq: Every day | ORAL | 0 refills | Status: DC

## 2023-01-04 ENCOUNTER — Telehealth: Payer: Self-pay

## 2023-01-04 NOTE — Patient Outreach (Signed)
  Care Coordination   Initial Visit Note   01/04/2023 Name: CORNELIA WALRAVEN MRN: [id number] DOB: Sep 26, 1960  DAVINE SWENEY is a 62 y.o. year old female who sees Crist Fat, MD for primary care. I spoke with  Billee Cashing by phone today.  What matters to the patients health and wellness today?  Placed call to patient today to review and offer [facility] care coordination program. Patient reports that she is doing well and denies any current needs.     SDOH assessments and interventions completed:  No     Care Coordination Interventions:  No, not indicated   Follow up plan: No further intervention required.   Encounter Outcome:  Pt. Refused   Rowe Pavy, RN, BSN, CEN [facility] [phone]

## 2023-01-05 ENCOUNTER — Other Ambulatory Visit: Payer: Self-pay | Admitting: Internal Medicine

## 2023-01-25 DIAGNOSIS — H02831 Dermatochalasis of right upper eyelid: Secondary | ICD-10-CM | POA: Diagnosis not present

## 2023-01-25 DIAGNOSIS — H25813 Combined forms of age-related cataract, bilateral: Secondary | ICD-10-CM | POA: Diagnosis not present

## 2023-01-25 DIAGNOSIS — H4322 Crystalline deposits in vitreous body, left eye: Secondary | ICD-10-CM | POA: Diagnosis not present

## 2023-01-25 DIAGNOSIS — H02834 Dermatochalasis of left upper eyelid: Secondary | ICD-10-CM | POA: Diagnosis not present

## 2023-02-05 ENCOUNTER — Ambulatory Visit: Payer: 59 | Admitting: Internal Medicine

## 2023-02-05 ENCOUNTER — Encounter: Payer: Self-pay | Admitting: Internal Medicine

## 2023-02-05 VITALS — BP 132/88 | HR 67 | Resp 19 | Ht <= 58 in | Wt 199.2 lb

## 2023-02-05 DIAGNOSIS — G894 Chronic pain syndrome: Secondary | ICD-10-CM

## 2023-02-05 DIAGNOSIS — E7800 Pure hypercholesterolemia, unspecified: Secondary | ICD-10-CM

## 2023-02-05 DIAGNOSIS — I10 Essential (primary) hypertension: Secondary | ICD-10-CM | POA: Diagnosis not present

## 2023-02-05 MED ORDER — HYDROCODONE-ACETAMINOPHEN 5-325 MG PO TABS: 1.0000 | ORAL_TABLET | Freq: Four times a day (QID) | ORAL | 0 refills | Status: DC | PRN

## 2023-02-05 NOTE — Assessment & Plan Note (Signed)
Her BP is doing better.  I am going to continue on her current meds and we will see what it is doing on her next visit.

## 2023-02-05 NOTE — Progress Notes (Signed)
Office Visit  Subjective   Patient ID: Jenny Yates   DOB: 12-23-1960   Age: 62 y.o.   MRN: [id number]   Chief Complaint No chief complaint on file.    History of Present Illness Jenny Yates returns today for routine followup on her cholesterol. On her last visit, her cholesterol was not controlled and we changed her lipitor to crestor.  Since her last visit, she did contract COVID-19 and she is having some post fatigue.  She was diagnosed with COVID on 12/15/2022.  Overall, she states she is doing well and is without any complaints or problems at this time. In the past, we held off on a statin due to her history of elevated LFT's.  She specifically denies abdominal pain, nausea, vomiting, diarrhea, or myalgias.  She is on Crestor 20mg  qhs.  She is fasting in anticipation for labs today.    The patient is a 62 year old Caucasian/White female who presents for a follow-up evaluation of hypertension.  On her last visit, her BP was elevated and we increased her telmisartan from 40mg  to 80mg  daily.  We did switch her chlorthalidone back to HCTZ as it was not covered by her insurance .  They also will not pay for her benicar but they will pay for telmisartan.  Over the interim, she has not had any problems.  I have tried her on Norvasc in the past but she developed edema where this medicine was stopped.  Her edema resolved and she continues to wear compression hose.  The patient has been checking her blood pressure at home and her systolic BP is running 128-140's range.  The patient's current medications include: telmisartan 80mg  daily, Bystolic 20mg  daily, HCTZ 25mg  daily, and hydralazine 50mg  BID.  Otherwise the patient denies any headache, visual changes, dizziness, lightheadness, chest pain, shortness of breath, weakness/numbness, and edema. She reports there have been no other symptoms noted.     Jenny Yates also returns for follow up of her chronic pain with her history of of probable osteoarthritis  with joint pain and swelling in the hands, knees, ankles and her shoulders.  Since her last visit, there has been no change to her pain.  We did a lab workup which was essentially normal but her RMSF serology was abnormal and we treated her at that time.  She was also having fatigue as well as some generalized mild weakness.  She states all of her joints were hurting that is described as a dull constant aching pain that was moderate to severe at times.  She did have a tick bite on 10/30/2019 which was removed.  She denies any fevers, chills, rash, headaches or other problems at that time.  I gave her doxycycline as described above and her above symptoms resolved.  She never did go to rheumatology.  Again, [name] where she had a ruptured lumbar disc.  She attributes this to a combination of weight training and a slip and fall in her bathtub in 2007.  She was seen by neurosurgery with Dr. Lovell Sheehan and ultimately underwent a left L5-S1 microdiscectomy in 06/2006.  Since then she has had intermittent chronic low back pain that does not affect her daily function.  She does use meloxicam for her pain.  She also remains on hydrocodone/APAP 5/325 where she uses this 2-3 times per week. She denies any new weakness/numbness or loss of bowel/bladder function.      Past Medical History  Past Medical History:  Diagnosis Date   Anemia    Anxiety    Collagenous colitis    Depression    Hyperlipidemia    Hypertension    Hypothyroidism    Lumbar herniated disc    Panic disorder      Allergies Allergies  Allergen Reactions   Azithromycin    Codeine    Epinephrine    Iodine      Medications  Current Outpatient Medications:    ALPRAZolam (XANAX) 0.5 MG tablet, Take 1 tablet (0.5 mg total) by mouth 3 (three) times daily as needed., Disp: 90 tablet, Rfl: 2   atorvastatin (LIPITOR) 80 MG tablet, Take 1 tablet (80 mg total) by mouth daily., Disp: 30 tablet, Rfl: 0   budesonide  (ENTOCORT EC) 3 MG 24 hr capsule, Take 9 mg by mouth daily., Disp: , Rfl:    budesonide (ENTOCORT EC) 3 MG 24 hr capsule, Take 3 capsules (9 mg total) by mouth daily., Disp: 270 capsule, Rfl: 3   diazepam (VALIUM) 5 MG tablet, Take 5 mg by mouth 2 (two) times daily as needed., Disp: , Rfl:    hydrALAZINE (APRESOLINE) 50 MG tablet, TAKE 1 TABLET 2 TIMES DAILYWITH FOOD, Disp: 180 tablet, Rfl: 1   hydrochlorothiazide (HYDRODIURIL) 25 MG tablet, Take 1 tablet (25 mg total) by mouth daily., Disp: 90 tablet, Rfl: 1   HYDROcodone-acetaminophen (NORCO/VICODIN) 5-325 MG tablet, Take 1 tablet by mouth every 6 (six) hours as needed for moderate pain., Disp: 60 tablet, Rfl: 0   levothyroxine (SYNTHROID) 100 MCG tablet, Take 1 tablet (100 mcg total) by mouth daily., Disp: 90 tablet, Rfl: 3   meloxicam (MOBIC) 15 MG tablet, TAKE 1 TABLET ONCE DAILY, Disp: 90 tablet, Rfl: 1   nebivolol (BYSTOLIC) 10 MG tablet, TAKE 2 TABLETS DAILY, Disp: 180 tablet, Rfl: 1   rosuvastatin (CRESTOR) 20 MG tablet, Take 1 tablet (20 mg total) by mouth daily., Disp: 90 tablet, Rfl: 1   telmisartan (MICARDIS) 80 MG tablet, Take 1 tablet (80 mg total) by mouth daily., Disp: 90 tablet, Rfl: 3   Review of Systems Review of Systems  Constitutional:  Positive for malaise/fatigue. Negative for chills and fever.  Eyes:  Negative for blurred vision and double vision.  Respiratory:  Positive for shortness of breath. Negative for cough.   Cardiovascular:  Negative for chest pain, palpitations and leg swelling.  Gastrointestinal:  Negative for abdominal pain, constipation, diarrhea, nausea and vomiting.  Genitourinary:  Negative for frequency.  Musculoskeletal:  Positive for back pain and joint pain. Negative for myalgias.  Skin:  Negative for itching and rash.  Neurological:  Negative for dizziness, weakness and headaches.  Endo/Heme/Allergies:  Negative for polydipsia.       Objective:    Vitals BP 132/88   Pulse 67   Resp 19    Ht 4\' 9"  (1.448 m)   Wt 199 lb 3.2 oz (90.4 kg)   BMI 43.11 kg/m    Physical Examination Physical Exam Constitutional:      Appearance: Normal appearance. She is not ill-appearing.  Cardiovascular:     Rate and Rhythm: Normal rate and regular rhythm.     Pulses: Normal pulses.     Heart sounds: No murmur heard.    No friction rub. No gallop.  Pulmonary:     Effort: Pulmonary effort is normal. No respiratory distress.     Breath sounds: No wheezing, rhonchi or rales.  Abdominal:     General: Bowel sounds are normal.  There is no distension.     Palpations: Abdomen is soft.     Tenderness: There is no abdominal tenderness.  Musculoskeletal:     Right lower leg: No edema.     Left lower leg: No edema.  Skin:    General: Skin is warm and dry.     Findings: No rash.  Neurological:     Mental Status: She is alert.        Assessment & Plan:   Essential hypertension Her BP is doing better.  I am going to continue on her current meds and we will see what it is doing on her next visit.  Hypercholesterolemia We will check a FLP today since she is now on crestor.  Chronic pain syndrome She is having a lot of fatigue from her COVID but no other problems.  I reviewed her [place] controlled substance registry and we will refill her hydrocodone at this time.    Return in about 3 months (around 05/08/2023) for annual.   Crist Fat, MD

## 2023-02-05 NOTE — Assessment & Plan Note (Signed)
She is having a lot of fatigue from her COVID but no other problems.  I reviewed her [place] controlled substance registry and we will refill her hydrocodone at this time.

## 2023-02-05 NOTE — Assessment & Plan Note (Signed)
We will check a FLP today since she is now on crestor.

## 2023-02-15 NOTE — Progress Notes (Signed)
Her cholesterol labs were cancelled?  I dont' understand why.  We will just do them on her next visit.  Patient is aware

## 2023-03-30 ENCOUNTER — Other Ambulatory Visit: Payer: Self-pay | Admitting: Internal Medicine

## 2023-05-21 DIAGNOSIS — Z1231 Encounter for screening mammogram for malignant neoplasm of breast: Secondary | ICD-10-CM | POA: Diagnosis not present

## 2023-05-23 ENCOUNTER — Encounter: Payer: Self-pay | Admitting: Internal Medicine

## 2023-05-23 ENCOUNTER — Ambulatory Visit: Payer: 59 | Admitting: Internal Medicine

## 2023-05-23 VITALS — BP 122/82 | HR 71 | Temp 98.6°F | Resp 18 | Ht <= 58 in | Wt 200.2 lb

## 2023-05-23 DIAGNOSIS — E7800 Pure hypercholesterolemia, unspecified: Secondary | ICD-10-CM | POA: Diagnosis not present

## 2023-05-23 DIAGNOSIS — G894 Chronic pain syndrome: Secondary | ICD-10-CM | POA: Diagnosis not present

## 2023-05-23 DIAGNOSIS — F411 Generalized anxiety disorder: Secondary | ICD-10-CM | POA: Diagnosis not present

## 2023-05-23 DIAGNOSIS — K52831 Collagenous colitis: Secondary | ICD-10-CM

## 2023-05-23 DIAGNOSIS — M5126 Other intervertebral disc displacement, lumbar region: Secondary | ICD-10-CM | POA: Diagnosis not present

## 2023-05-23 DIAGNOSIS — E039 Hypothyroidism, unspecified: Secondary | ICD-10-CM

## 2023-05-23 DIAGNOSIS — Z0000 Encounter for general adult medical examination without abnormal findings: Secondary | ICD-10-CM | POA: Diagnosis not present

## 2023-05-23 DIAGNOSIS — I10 Essential (primary) hypertension: Secondary | ICD-10-CM | POA: Diagnosis not present

## 2023-05-23 DIAGNOSIS — Z6841 Body Mass Index (BMI) 40.0 and over, adult: Secondary | ICD-10-CM | POA: Diagnosis not present

## 2023-05-23 DIAGNOSIS — M150 Primary generalized (osteo)arthritis: Secondary | ICD-10-CM | POA: Diagnosis not present

## 2023-05-23 MED ORDER — HYDROCODONE-ACETAMINOPHEN 5-325 MG PO TABS: 1.0000 | ORAL_TABLET | Freq: Four times a day (QID) | ORAL | 0 refills | Status: DC | PRN

## 2023-05-23 MED ORDER — CELECOXIB 200 MG PO CAPS: 200.0000 mg | ORAL_CAPSULE | Freq: Every day | ORAL | 3 refills | Status: DC

## 2023-05-23 NOTE — Assessment & Plan Note (Signed)
We will check her FLP today since her crestor has been started.

## 2023-05-23 NOTE — Assessment & Plan Note (Signed)
She will use celebrex and her hydrocodone/APAP as needed.

## 2023-05-23 NOTE — Assessment & Plan Note (Signed)
Her BP is currently controlled.  We will continue on her current medications.

## 2023-05-23 NOTE — Assessment & Plan Note (Signed)
Health maintenance discussed.  They will read her mammogram.  We will obtain some yearly labs.

## 2023-05-23 NOTE — Progress Notes (Signed)
Office Visit  Subjective   Patient ID: Jenny Yates   DOB: 1960-11-12   Age: 62 y.o.   MRN: [id number]   Chief Complaint Chief Complaint  Patient presents with   Annual Exam     History of Present Illness Jenny Yates is a 62 year old Caucasian/White female who presents for her annual health maintenance exam. She is due for the following health maintenance studies: mammogram and screening labs. This patient's past medical history Anemia, Anxiety Disorder, Collagenous Colitis, Depression, Herniated Lumbar Disc, Hyperlipidemia, Hypertension, Hypothyroidism, and Panic Disorder.   Her last eye exam was in 01/2023 and her vision is doing well and they are following her for cataracts. She has a history of collagenous colitis diagnosed in 2010 and had a colonoscopy at that time. She remains on budesonide at this time. She is not having any problems with bowel movements. She did have a repeat colonoscopy in 05/2019 which showed 3 polyps. They want to repeat this colonoscopy in 7 years. Her last screening digital mammogram was done in 05/21/2023 and this reading is pending.  Her last Pap smear was on 11/2018 and was normal. Her previous PAP smear was in 04/2016 and this was normal. She had a previous pap on 02/2015 and this showed was normal. Her previous PAP in 07/2012 showed LGSIL, HPV negative and her prevous PAP in 08/2013 showed ASCUS. We sent her to OB/Gyn and they did a coloposcopy at that time which was normal. She had several abnormal paps 15-20 years ago with colposcopy at that time. She had a tubal ligation right before she turned 40. She denies any problems with urination and no incontinence. She does exercise by hiking and yoga. The patient does not smoke.  She does get yearly flu vaccines. She has had 5 COVID-19 vaccines including 3 boosters. She had a shingrix in 02/2022. There is no worsening depression or anxiety. There is no memory loss. She is no longer on ASA 81mg  daily.   Jenny Yates  returns today for routine followup on her cholesterol. This past year, her cholesterol was not controlled and we changed her lipitor to crestor.  Since her last visit, she did contract COVID-19 and she is having some post fatigue.  She was diagnosed with COVID on 12/15/2022.  Overall, she states she is doing well and is without any complaints or problems at this time. In the past, we held off on a statin due to her history of elevated LFT's.  She specifically denies abdominal pain, nausea, vomiting, diarrhea, or myalgias.  She is on Crestor 20mg  qhs.  She is fasting in anticipation for labs today.    The patient is a 62 year old Caucasian/White female who presents for a follow-up evaluation of hypertension.  On her last visit, her BP was elevated and we increased her telmisartan from 40mg  to 80mg  daily.  We did switch her chlorthalidone back to HCTZ as it was not covered by her insurance .  They also will not pay for her benicar but they will pay for telmisartan.  Over the interim, she has not had any problems.  I have tried her on Norvasc in the past but she developed edema where this medicine was stopped.  Her edema resolved and she continues to wear compression hose.  The patient has been checking her blood pressure at home and her systolic BP is running 120-130's range.  The patient's current medications include: telmisartan 80mg  daily, Bystolic 20mg  daily, HCTZ 25mg   daily, and hydralazine 50mg  BID.  Otherwise the patient denies any headache, visual changes, dizziness, lightheadness, chest pain, shortness of breath, weakness/numbness, and edema. She reports there have been no other symptoms noted.     Jenny Yates also returns for follow up of her chronic pain with her history of of probable osteoarthritis with joint pain and swelling in the hands, knees, ankles and her shoulders.  Since her last visit, she did stop her meloxicam as she read that using meloxicam and budesonide increases her risk of GI bleeding.   We  have done a lab workup for her arthritic pain which was essentially normal but her RMSF serology was abnormal and we treated her at that time.  She was also having fatigue as well as some generalized mild weakness.  She states all of her joints were hurting that is described as a dull constant aching pain that was moderate to severe at times.  She did have a tick bite on 10/30/2019 which was removed.  She denies any fevers, chills, rash, headaches or other problems at that time.  I gave her doxycycline as described above and her above symptoms resolved.  She never did go to rheumatology.  Again, [name] where she had a ruptured lumbar disc.  She attributes this to a combination of weight training and a slip and fall in her bathtub in 2007.  She was seen by neurosurgery with Dr. Lovell Sheehan and ultimately underwent a left L5-S1 microdiscectomy in 06/2006.  Since then she has had intermittent chronic low back pain that does not affect her daily function.  She does use meloxicam for her pain.  She also remains on hydrocodone/APAP 5/325 where she uses this 2-3 times per week. She denies any new weakness/numbness or loss of bowel/bladder function.    The patient returns for followup of her depression and anxiety.  The symptoms have been present for years and she describes her depression as resolved and her anxiety as mild in intensity.  She used to be on wellbutrin but stop this on her own about 5 years ago. She is on valium 5mg  po q 12 hrs prn which she does not take every day.  She will take this a couple of times per week.  She takes valium for muscle spasm.  She is also on xanax which she only uses to break panic attacks where she takes xanax 2-3 times per month. She denies difficulty concentrating, difficulty performing routine daily activities, extreme feelings of guilt, feelings of isolation, feelings of worthlessness, weight loss, insomnia, loss of appetite, social withdrawal, out of  control feelings, and panic attacks. She currently lives with her husband. She has no significant prior history of mental health disorders.   The patient is a 62 year old Caucasian/White female who returns for a regularly scheduled thyroid check. Since the last visit, there has been no overall change in her status. She remains on Synthroid 100 mcg oral tablet. She claims to have no symptoms suggestive of thyroid imbalance specifically denying cold intolerance, heat intolerance, tremors, anxiety, unexplained weight changes, and insomnia.         Past Medical History Past Medical History:  Diagnosis Date   Anemia    Anxiety    Collagenous colitis    Depression    Hyperlipidemia    Hypertension    Hypothyroidism    Lumbar herniated disc    Panic disorder      Allergies Allergies  Allergen Reactions  Azithromycin    Codeine    Epinephrine    Iodine      Medications  Current Outpatient Medications:    ALPRAZolam (XANAX) 0.5 MG tablet, Take 1 tablet (0.5 mg total) by mouth 3 (three) times daily as needed., Disp: 90 tablet, Rfl: 2   atorvastatin (LIPITOR) 80 MG tablet, Take 1 tablet (80 mg total) by mouth daily., Disp: 30 tablet, Rfl: 0   budesonide (ENTOCORT EC) 3 MG 24 hr capsule, Take 9 mg by mouth daily., Disp: , Rfl:    budesonide (ENTOCORT EC) 3 MG 24 hr capsule, Take 3 capsules (9 mg total) by mouth daily., Disp: 270 capsule, Rfl: 3   diazepam (VALIUM) 5 MG tablet, Take 5 mg by mouth 2 (two) times daily as needed., Disp: , Rfl:    hydrALAZINE (APRESOLINE) 50 MG tablet, TAKE 1 TABLET 2 TIMES DAILYWITH FOOD, Disp: 180 tablet, Rfl: 1   hydrochlorothiazide (HYDRODIURIL) 25 MG tablet, TAKE 1 TABLET DAILY, Disp: 90 tablet, Rfl: 1   HYDROcodone-acetaminophen (NORCO/VICODIN) 5-325 MG tablet, Take 1 tablet by mouth every 6 (six) hours as needed for moderate pain., Disp: 60 tablet, Rfl: 0   levothyroxine (SYNTHROID) 100 MCG tablet, Take 1 tablet (100 mcg total) by mouth daily.,  Disp: 90 tablet, Rfl: 3   meloxicam (MOBIC) 15 MG tablet, TAKE 1 TABLET ONCE DAILY, Disp: 90 tablet, Rfl: 1   nebivolol (BYSTOLIC) 10 MG tablet, TAKE 2 TABLETS DAILY, Disp: 180 tablet, Rfl: 1   rosuvastatin (CRESTOR) 20 MG tablet, TAKE 1 TABLET DAILY, Disp: 90 tablet, Rfl: 1   telmisartan (MICARDIS) 80 MG tablet, Take 1 tablet (80 mg total) by mouth daily., Disp: 90 tablet, Rfl: 3   Review of Systems Review of Systems  Constitutional:  Negative for chills, fever and malaise/fatigue.  Eyes:  Negative for blurred vision and double vision.  Respiratory:  Negative for cough, hemoptysis, shortness of breath and wheezing.   Cardiovascular:  Negative for chest pain, palpitations and leg swelling.  Gastrointestinal:  Negative for abdominal pain, blood in stool, constipation, diarrhea, heartburn, melena, nausea and vomiting.  Genitourinary:  Negative for frequency and hematuria.  Musculoskeletal:  Positive for myalgias.  Skin:  Negative for itching and rash.  Neurological:  Negative for dizziness, weakness and headaches.  Endo/Heme/Allergies:  Negative for polydipsia.  Psychiatric/Behavioral:  Negative for depression. The patient is nervous/anxious. The patient does not have insomnia.        Objective:    Vitals BP 122/82   Pulse 71   Temp 98.6 F (37 C)   Resp 18   Ht 4\' 10"  (1.473 m)   Wt 200 lb 3.2 oz (90.8 kg)   SpO2 92%   BMI 41.84 kg/m    Physical Examination Physical Exam Constitutional:      Appearance: Normal appearance. She is not ill-appearing.  HENT:     Head: Normocephalic and atraumatic.     Right Ear: Tympanic membrane, ear canal and external ear normal.     Left Ear: Tympanic membrane, ear canal and external ear normal.     Nose: Nose normal. No congestion or rhinorrhea.     Mouth/Throat:     Mouth: Mucous membranes are moist.     Pharynx: Oropharynx is clear. No oropharyngeal exudate or posterior oropharyngeal erythema.  Eyes:     General: No scleral  icterus.    Conjunctiva/sclera: Conjunctivae normal.     Pupils: Pupils are equal, round, and reactive to light.  Neck:     Vascular:  No carotid bruit.  Cardiovascular:     Rate and Rhythm: Normal rate and regular rhythm.     Pulses: Normal pulses.     Heart sounds: No murmur heard.    No friction rub. No gallop.  Pulmonary:     Effort: Pulmonary effort is normal. No respiratory distress.     Breath sounds: No wheezing, rhonchi or rales.  Abdominal:     General: Bowel sounds are normal. There is no distension.     Palpations: Abdomen is soft.     Tenderness: There is no abdominal tenderness.  Musculoskeletal:     Cervical back: Neck supple. No tenderness.     Right lower leg: No edema.     Left lower leg: No edema.  Lymphadenopathy:     Cervical: No cervical adenopathy.  Skin:    General: Skin is warm and dry.     Findings: No rash.  Neurological:     General: No focal deficit present.     Mental Status: She is alert and oriented to person, place, and time.  Psychiatric:        Mood and Affect: Mood normal.        Behavior: Behavior normal.        Assessment & Plan:   Essential hypertension Her BP is currently controlled.  We will continue on her current medications.  Collagenous colitis She is currently on budesonide which we will continue.  Hypothyroidism She seems euthyroid.  We will check her thyroid functions today.  Primary osteoarthritis involving multiple joints She has stopped meloxicam and we will start her on celebrex for her arthritis.  Herniated lumbar disc without myelopathy She will use celebrex and her hydrocodone/APAP as needed.  Morbid obesity (HCC) She has morbid obesity associated with HTN and HCL.  I want her to eat healthy, exercise and lose weight.  Hypercholesterolemia We will check her FLP today since her crestor has been started.  GAD (generalized anxiety disorder) This is mild and controlled at this point.  Chronic pain  syndrome Continue her current meds.  We reviewed her [place] controlled substance registry.  BMI 40.0-44.9, adult [facility]) Plan as above.  Annual physical exam Health maintenance discussed.  They will read her mammogram.  We will obtain some yearly labs.    Return in about 3 months (around 08/23/2023).   Crist Fat, MD

## 2023-05-23 NOTE — Assessment & Plan Note (Signed)
Plan as above.  

## 2023-05-23 NOTE — Assessment & Plan Note (Signed)
She has morbid obesity associated with HTN and HCL.  I want her to eat healthy, exercise and lose weight.

## 2023-05-23 NOTE — Assessment & Plan Note (Signed)
She seems euthyroid.  We will check her thyroid functions today.

## 2023-05-23 NOTE — Assessment & Plan Note (Signed)
She is currently on budesonide which we will continue.

## 2023-05-23 NOTE — Assessment & Plan Note (Signed)
This is mild and controlled at this point.

## 2023-05-23 NOTE — Assessment & Plan Note (Signed)
Continue her current meds.  We reviewed her [place] controlled substance registry.

## 2023-05-23 NOTE — Assessment & Plan Note (Signed)
She has stopped meloxicam and we will start her on celebrex for her arthritis.

## 2023-05-24 LAB — CMP14 + ANION GAP
ALT: 41 [IU]/L — ABNORMAL HIGH (ref 0–32)
AST: 27 [IU]/L (ref 0–40)
Albumin: 4.4 g/dL (ref 3.9–4.9)
Alkaline Phosphatase: 71 [IU]/L (ref 44–121)
Anion Gap: 15 mmol/L (ref 10.0–18.0)
BUN/Creatinine Ratio: 31 — ABNORMAL HIGH (ref 12–28)
BUN: 25 mg/dL (ref 8–27)
Bilirubin Total: 0.3 mg/dL (ref 0.0–1.2)
CO2: 26 mmol/L (ref 20–29)
Calcium: 10.2 mg/dL (ref 8.7–10.3)
Chloride: 102 mmol/L (ref 96–106)
Creatinine, Ser: 0.8 mg/dL (ref 0.57–1.00)
Globulin, Total: 2.2 g/dL (ref 1.5–4.5)
Glucose: 91 mg/dL (ref 70–99)
Potassium: 4.4 mmol/L (ref 3.5–5.2)
Sodium: 143 mmol/L (ref 134–144)
Total Protein: 6.6 g/dL (ref 6.0–8.5)
eGFR: 83 mL/min/{1.73_m2} (ref >59)

## 2023-05-24 LAB — TSH: TSH: 2.87 u[IU]/mL (ref 0.450–4.500)

## 2023-05-24 LAB — CBC WITH DIFFERENTIAL/PLATELET
Basophils Absolute: 0 10*3/uL (ref 0.0–0.2)
Basos: 1 %
EOS (ABSOLUTE): 0.1 10*3/uL (ref 0.0–0.4)
Eos: 1 %
Hematocrit: 39.7 % (ref 34.0–46.6)
Hemoglobin: 12.8 g/dL (ref 11.1–15.9)
Immature Grans (Abs): 0.1 10*3/uL (ref 0.0–0.1)
Immature Granulocytes: 1 %
Lymphocytes Absolute: 2.6 10*3/uL (ref 0.7–3.1)
Lymphs: 36 %
MCH: 33.2 pg — ABNORMAL HIGH (ref 26.6–33.0)
MCHC: 32.2 g/dL (ref 31.5–35.7)
MCV: 103 fL — ABNORMAL HIGH (ref 79–97)
Monocytes Absolute: 0.6 10*3/uL (ref 0.1–0.9)
Monocytes: 8 %
Neutrophils Absolute: 4 10*3/uL (ref 1.4–7.0)
Neutrophils: 53 %
Platelets: 228 10*3/uL (ref 150–450)
RBC: 3.86 x10E6/uL (ref 3.77–5.28)
RDW: 12.6 % (ref 11.7–15.4)
WBC: 7.3 10*3/uL (ref 3.4–10.8)

## 2023-05-24 LAB — LIPID PANEL
Chol/HDL Ratio: 2.1 {ratio} (ref 0.0–4.4)
Cholesterol, Total: 216 mg/dL — ABNORMAL HIGH (ref 100–199)
HDL: 101 mg/dL (ref >39)
LDL Chol Calc (NIH): 85 mg/dL (ref 0–99)
Triglycerides: 183 mg/dL — ABNORMAL HIGH (ref 0–149)
VLDL Cholesterol Cal: 30 mg/dL (ref 5–40)

## 2023-05-24 LAB — HEMOGLOBIN A1C
Est. average glucose Bld gHb Est-mCnc: 123 mg/dL
Hgb A1c MFr Bld: 5.9 % — ABNORMAL HIGH (ref 4.8–5.6)

## 2023-05-24 LAB — T4, FREE: Free T4: 1.29 ng/dL (ref 0.82–1.77)

## 2023-05-24 LAB — VITAMIN D 25 HYDROXY (VIT D DEFICIENCY, FRACTURES): Vit D, 25-Hydroxy: 44.6 ng/mL (ref 30.0–100.0)

## 2023-05-27 ENCOUNTER — Other Ambulatory Visit: Payer: Self-pay | Admitting: Internal Medicine

## 2023-05-28 ENCOUNTER — Other Ambulatory Visit: Payer: Self-pay

## 2023-05-28 DIAGNOSIS — E7800 Pure hypercholesterolemia, unspecified: Secondary | ICD-10-CM

## 2023-05-28 DIAGNOSIS — I10 Essential (primary) hypertension: Secondary | ICD-10-CM

## 2023-06-06 ENCOUNTER — Encounter: Payer: Self-pay | Admitting: Internal Medicine

## 2023-06-13 ENCOUNTER — Other Ambulatory Visit: Payer: Self-pay

## 2023-06-13 DIAGNOSIS — I10 Essential (primary) hypertension: Secondary | ICD-10-CM

## 2023-06-13 DIAGNOSIS — E7800 Pure hypercholesterolemia, unspecified: Secondary | ICD-10-CM

## 2023-06-13 MED ORDER — CELECOXIB 200 MG PO CAPS: 200.0000 mg | ORAL_CAPSULE | Freq: Every day | ORAL | 3 refills | Status: DC

## 2023-06-13 NOTE — Progress Notes (Signed)
Rx Refill

## 2023-06-22 ENCOUNTER — Other Ambulatory Visit: Payer: Self-pay | Admitting: Internal Medicine

## 2023-06-26 NOTE — Progress Notes (Signed)
Her labs look good.  Patients lab report given to husband on 12/31

## 2023-08-07 DIAGNOSIS — Z809 Family history of malignant neoplasm, unspecified: Secondary | ICD-10-CM | POA: Diagnosis not present

## 2023-08-07 DIAGNOSIS — E039 Hypothyroidism, unspecified: Secondary | ICD-10-CM | POA: Diagnosis not present

## 2023-08-07 DIAGNOSIS — F329 Major depressive disorder, single episode, unspecified: Secondary | ICD-10-CM | POA: Diagnosis not present

## 2023-08-07 DIAGNOSIS — Z008 Encounter for other general examination: Secondary | ICD-10-CM | POA: Diagnosis not present

## 2023-08-07 DIAGNOSIS — Z791 Long term (current) use of non-steroidal anti-inflammatories (NSAID): Secondary | ICD-10-CM | POA: Diagnosis not present

## 2023-08-07 DIAGNOSIS — Z9181 History of falling: Secondary | ICD-10-CM | POA: Diagnosis not present

## 2023-08-07 DIAGNOSIS — Z8249 Family history of ischemic heart disease and other diseases of the circulatory system: Secondary | ICD-10-CM | POA: Diagnosis not present

## 2023-08-07 DIAGNOSIS — Z87891 Personal history of nicotine dependence: Secondary | ICD-10-CM | POA: Diagnosis not present

## 2023-08-07 DIAGNOSIS — M1990 Unspecified osteoarthritis, unspecified site: Secondary | ICD-10-CM | POA: Diagnosis not present

## 2023-08-07 DIAGNOSIS — F419 Anxiety disorder, unspecified: Secondary | ICD-10-CM | POA: Diagnosis not present

## 2023-08-07 DIAGNOSIS — Z85828 Personal history of other malignant neoplasm of skin: Secondary | ICD-10-CM | POA: Diagnosis not present

## 2023-08-07 DIAGNOSIS — Z823 Family history of stroke: Secondary | ICD-10-CM | POA: Diagnosis not present

## 2023-08-07 DIAGNOSIS — E785 Hyperlipidemia, unspecified: Secondary | ICD-10-CM | POA: Diagnosis not present

## 2023-08-11 ENCOUNTER — Other Ambulatory Visit: Payer: Self-pay | Admitting: Internal Medicine

## 2023-08-24 ENCOUNTER — Encounter: Payer: Self-pay | Admitting: Internal Medicine

## 2023-08-24 ENCOUNTER — Ambulatory Visit: Payer: 59 | Admitting: Internal Medicine

## 2023-08-24 VITALS — BP 124/80 | HR 86 | Temp 98.6°F | Resp 18 | Ht <= 58 in | Wt 201.2 lb

## 2023-08-24 DIAGNOSIS — I10 Essential (primary) hypertension: Secondary | ICD-10-CM

## 2023-08-24 DIAGNOSIS — L989 Disorder of the skin and subcutaneous tissue, unspecified: Secondary | ICD-10-CM

## 2023-08-24 DIAGNOSIS — M150 Primary generalized (osteo)arthritis: Secondary | ICD-10-CM

## 2023-08-24 MED ORDER — OLMESARTAN MEDOXOMIL 20 MG PO TABS: 20.0000 mg | ORAL_TABLET | Freq: Two times a day (BID) | ORAL | 3 refills | Status: DC

## 2023-08-24 MED ORDER — ALPRAZOLAM 0.5 MG PO TABS: 0.5000 mg | ORAL_TABLET | Freq: Three times a day (TID) | ORAL | 2 refills | Status: DC | PRN

## 2023-08-24 NOTE — Progress Notes (Signed)
 Office Visit  Subjective   Patient ID: Jenny Yates   DOB: 09/18/1960   Age: 63 y.o.   MRN: [id number]   Chief Complaint Chief Complaint  Patient presents with   Follow-up     History of Present Illness The patient is a 63 year old Caucasian/White female who presents for a follow-up evaluation of hypertension.  This past year, her BP was elevated and we increased her telmisartan from 40mg  to 80mg  daily.  We did switch her chlorthalidone back to HCTZ as it was not covered by her insurance.  They also will not pay for her benicar but they will pay for telmisartan.  Over the interim, she has not had any problems.  I have tried her on Norvasc in the past but she developed edema where this medicine was stopped.  Her edema resolved and she continues to wear compression hose.  The patient has been checking her blood pressure at home and her systolic BP is running 120-140's range.  The patient's current medications include: telmisartan 80mg  daily, Bystolic 20mg  daily, HCTZ 25mg  daily, and hydralazine 50mg  BID.  Otherwise the patient denies any headache, visual changes, dizziness, lightheadness, chest pain, shortness of breath, weakness/numbness, and edema. She reports there have been no other symptoms noted.     [name] also returns for follow up of her chronic pain with her history of of probable osteoarthritis with joint pain and swelling in the hands, knees, ankles and her shoulders.  On her last visit, we changed her meloxicam to celebrex and she states this does help her better.  We have done a lab workup for her arthritic pain which was essentially normal but her RMSF serology was abnormal and we treated her at that time.  She was also having fatigue as well as some generalized mild weakness.  She states all of her joints were hurting that is described as a dull constant aching pain that was moderate to severe at times.  She did have a tick bite on 10/30/2019 which was removed.  She denies any fevers,  chills, rash, headaches or other problems at that time.  I gave her doxycycline as described above and her above symptoms resolved.  She never did go to rheumatology.  Again, [name] also has a history of back pain where she had a ruptured lumbar disc.  She attributes this to a combination of weight training and a slip and fall in her bathtub in 2007.  She was seen by neurosurgery with Dr. Lovell Sheehan and ultimately underwent a left L5-S1 microdiscectomy in 06/2006.  Since then she has had intermittent chronic low back pain that does not affect her daily function.  She also remains on hydrocodone/APAP 5/325 where she uses this 2-3 times per week. She denies any new weakness/numbness or loss of bowel/bladder function.  Her last use of hydrocodone was 4-5 days ago.  She has had mulitiple skin cancers in the past including basal cell of her right arm.  She was seen Dr. Mayford Knife for a yearly skin check but her insurance is now denying his visit and they need a new referral.     Past Medical History Past Medical History:  Diagnosis Date   Anemia    Anxiety    Collagenous colitis    Depression    Hyperlipidemia    Hypertension    Hypothyroidism    Lumbar herniated disc    Panic disorder      Allergies Allergies  Allergen Reactions   Azithromycin  Codeine    Epinephrine    Iodine      Medications  Current Outpatient Medications:    ALPRAZolam (XANAX) 0.5 MG tablet, Take 1 tablet (0.5 mg total) by mouth 3 (three) times daily as needed., Disp: 90 tablet, Rfl: 2   budesonide (ENTOCORT EC) 3 MG 24 hr capsule, Take 9 mg by mouth daily., Disp: , Rfl:    budesonide (ENTOCORT EC) 3 MG 24 hr capsule, Take 3 capsules (9 mg total) by mouth daily., Disp: 270 capsule, Rfl: 3   budesonide (ENTOCORT EC) 3 MG 24 hr capsule, TAKE 3 CAPSULES BY MOUTH EVERY DAY, Disp: 270 capsule, Rfl: 1   celecoxib (CELEBREX) 200 MG capsule, Take 1 capsule (200 mg total) by mouth daily., Disp: 90 capsule, Rfl: 3   diazepam  (VALIUM) 5 MG tablet, Take 5 mg by mouth 2 (two) times daily as needed., Disp: , Rfl:    hydrALAZINE (APRESOLINE) 50 MG tablet, TAKE 1 TABLET 2 TIMES DAILYWITH FOOD, Disp: 180 tablet, Rfl: 1   hydrochlorothiazide (HYDRODIURIL) 25 MG tablet, TAKE 1 TABLET DAILY, Disp: 90 tablet, Rfl: 1   HYDROcodone-acetaminophen (NORCO/VICODIN) 5-325 MG tablet, Take 1 tablet by mouth every 6 (six) hours as needed for moderate pain (pain score 4-6)., Disp: 60 tablet, Rfl: 0   levothyroxine (SYNTHROID) 100 MCG tablet, Take 1 tablet (100 mcg total) by mouth daily., Disp: 90 tablet, Rfl: 3   nebivolol (BYSTOLIC) 10 MG tablet, TAKE 2 TABLETS DAILY, Disp: 180 tablet, Rfl: 1   rosuvastatin (CRESTOR) 20 MG tablet, TAKE 1 TABLET DAILY, Disp: 90 tablet, Rfl: 1   Review of Systems Review of Systems  Constitutional:  Negative for chills and fever.  Eyes:  Negative for blurred vision and double vision.  Respiratory:  Negative for shortness of breath.   Cardiovascular:  Negative for chest pain, palpitations and leg swelling.  Gastrointestinal:  Negative for abdominal pain, constipation, diarrhea, nausea and vomiting.  Neurological:  Negative for dizziness, weakness and headaches.       Objective:    Vitals BP 124/80   Pulse 86   Temp 98.6 F (37 C)   Resp 18   Ht 4\' 9"  (1.448 m)   Wt 201 lb 3.2 oz (91.3 kg)   SpO2 93%   BMI 43.54 kg/m    Physical Examination Physical Exam Constitutional:      Appearance: Normal appearance. She is not ill-appearing.  Cardiovascular:     Rate and Rhythm: Normal rate and regular rhythm.     Pulses: Normal pulses.     Heart sounds: No murmur heard.    No friction rub. No gallop.  Pulmonary:     Effort: Pulmonary effort is normal. No respiratory distress.     Breath sounds: No wheezing, rhonchi or rales.  Abdominal:     General: Bowel sounds are normal. There is no distension.     Palpations: Abdomen is soft.     Tenderness: There is no abdominal tenderness.   Musculoskeletal:     Right lower leg: No edema.     Left lower leg: No edema.  Skin:    General: Skin is warm and dry.     Findings: No rash.  Neurological:     Mental Status: She is alert.        Assessment & Plan:   Essential hypertension Her BP is doing excellent today.  She has new insurance which will cover her benicar now and she wants to switch back.  We will continue  her current meds.  Primary osteoarthritis involving multiple joints Her celebrex seems to be helping.  We will continue to monitor.    Return in about 3 months (around 11/21/2023).   Crist Fat, MD

## 2023-08-24 NOTE — Assessment & Plan Note (Signed)
 Her celebrex seems to be helping.  We will continue to monitor.

## 2023-08-24 NOTE — Assessment & Plan Note (Signed)
 Her BP is doing excellent today.  She has new insurance which will cover her benicar now and she wants to switch back.  We will continue her current meds.

## 2023-10-19 ENCOUNTER — Other Ambulatory Visit: Payer: Self-pay | Admitting: Internal Medicine

## 2023-10-19 MED ORDER — ROSUVASTATIN CALCIUM 20 MG PO TABS: 20.0000 mg | ORAL_TABLET | Freq: Every day | ORAL | 1 refills | Status: DC

## 2023-10-19 MED ORDER — HYDRALAZINE HCL 50 MG PO TABS: 50.0000 mg | ORAL_TABLET | Freq: Two times a day (BID) | ORAL | 1 refills | Status: DC

## 2023-11-07 ENCOUNTER — Encounter: Payer: Self-pay | Admitting: Dermatology

## 2023-11-07 ENCOUNTER — Ambulatory Visit (INDEPENDENT_AMBULATORY_CARE_PROVIDER_SITE_OTHER): Payer: Self-pay | Admitting: Dermatology

## 2023-11-07 VITALS — BP 132/80 | HR 80

## 2023-11-07 DIAGNOSIS — W908XXA Exposure to other nonionizing radiation, initial encounter: Secondary | ICD-10-CM

## 2023-11-07 DIAGNOSIS — B078 Other viral warts: Secondary | ICD-10-CM | POA: Diagnosis not present

## 2023-11-07 DIAGNOSIS — Z87891 Personal history of nicotine dependence: Secondary | ICD-10-CM | POA: Diagnosis not present

## 2023-11-07 DIAGNOSIS — L814 Other melanin hyperpigmentation: Secondary | ICD-10-CM

## 2023-11-07 DIAGNOSIS — L570 Actinic keratosis: Secondary | ICD-10-CM | POA: Diagnosis not present

## 2023-11-07 DIAGNOSIS — D492 Neoplasm of unspecified behavior of bone, soft tissue, and skin: Secondary | ICD-10-CM | POA: Diagnosis not present

## 2023-11-07 DIAGNOSIS — Z8589 Personal history of malignant neoplasm of other organs and systems: Secondary | ICD-10-CM

## 2023-11-07 DIAGNOSIS — D1801 Hemangioma of skin and subcutaneous tissue: Secondary | ICD-10-CM

## 2023-11-07 DIAGNOSIS — C4491 Basal cell carcinoma of skin, unspecified: Secondary | ICD-10-CM | POA: Insufficient documentation

## 2023-11-07 DIAGNOSIS — L578 Other skin changes due to chronic exposure to nonionizing radiation: Secondary | ICD-10-CM

## 2023-11-07 DIAGNOSIS — L821 Other seborrheic keratosis: Secondary | ICD-10-CM

## 2023-11-07 DIAGNOSIS — Z1283 Encounter for screening for malignant neoplasm of skin: Secondary | ICD-10-CM | POA: Diagnosis not present

## 2023-11-07 DIAGNOSIS — Z85828 Personal history of other malignant neoplasm of skin: Secondary | ICD-10-CM

## 2023-11-07 DIAGNOSIS — D692 Other nonthrombocytopenic purpura: Secondary | ICD-10-CM | POA: Diagnosis not present

## 2023-11-07 DIAGNOSIS — D485 Neoplasm of uncertain behavior of skin: Secondary | ICD-10-CM

## 2023-11-07 DIAGNOSIS — C4492 Squamous cell carcinoma of skin, unspecified: Secondary | ICD-10-CM | POA: Insufficient documentation

## 2023-11-07 NOTE — Progress Notes (Signed)
 New Patient Visit   Subjective  Jenny Yates is a 63 y.o. female who presents for the following: Skin Cancer Screening and Full Body Skin Exam - History of BCC and SCC.   History of Rosacea and has used doxycycline, ivermectin and metronidazole in the past.Not currently using anything to treat. Does use oral doxycycline with "bumps."  The patient presents for Total-Body Skin Exam (TBSE) for skin cancer screening and mole check. The patient has spots, moles and lesions to be evaluated, some may be new or changing.  The following portions of the chart were reviewed this encounter and updated as appropriate: medications, allergies, medical history  Review of Systems:  No other skin or systemic complaints except as noted in HPI or Assessment and Plan.  Objective  Well appearing patient in no apparent distress; mood and affect are within normal limits.  A full examination was performed including scalp, head, eyes, ears, nose, lips, neck, chest, axillae, abdomen, back, buttocks, bilateral upper extremities, bilateral lower extremities, hands, feet, fingers, toes, fingernails, and toenails. All findings within normal limits unless otherwise noted below.   Relevant physical exam findings are noted in the Assessment and Plan.  Left Upper Arm - Posterior 1.2 x 1.2 cm hyperkeratotic plaque  Left leg x 3, right leg x 1, right post lower leg x 1, left chest x 1, mid chest x 1, mid chest inf x 1, right chest x 1, right chest lat x 1 (10) Erythematous thin papules/macules with gritty scale.   Assessment & Plan   SKIN CANCER SCREENING PERFORMED TODAY.  ACTINIC DAMAGE - Chronic condition, secondary to cumulative UV/sun exposure - diffuse scaly erythematous macules with underlying dyspigmentation - Recommend daily broad spectrum sunscreen SPF 30+ to sun-exposed areas, reapply every 2 hours as needed.  - Staying in the shade or wearing long sleeves, sun glasses (UVA+UVB protection) and wide brim  hats (4-inch brim around the entire circumference of the hat) are also recommended for sun protection.  - Call for new or changing lesions.  MELANOCYTIC NEVI - Tan-brown and/or pink-flesh-colored symmetric macules and papules - Benign appearing on exam today - Observation - Call clinic for new or changing moles - Recommend daily use of broad spectrum spf 30+ sunscreen to sun-exposed areas.   LENTIGINES Exam: scattered tan macules Due to sun exposure Treatment Plan: Benign-appearing, observe. Recommend daily broad spectrum sunscreen SPF 30+ to sun-exposed areas, reapply every 2 hours as needed.  Call for any changes   HEMANGIOMA Exam: red papule(s) Discussed benign nature. Recommend observation. Call for changes.   SEBORRHEIC KERATOSIS - Stuck-on, waxy, tan-brown papules and/or plaques  - Benign-appearing - Discussed benign etiology and prognosis. - Observe - Call for any changes  Purpura - Chronic; persistent and recurrent.  Treatable, but not curable. - Violaceous macules and patches - Benign - Related to trauma, age, sun damage and/or use of blood thinners, chronic use of topical and/or oral steroids - Observe - Can use OTC arnica containing moisturizer such as Dermend Bruise Formula if desired - Call for worsening or other concerns  HISTORY OF BASAL CELL CARCINOMA OF THE SKIN - No evidence of recurrence today - Recommend regular full body skin exams - Recommend daily broad spectrum sunscreen SPF 30+ to sun-exposed areas, reapply every 2 hours as needed.  - Call if any new or changing lesions are noted between office visits  HISTORY OF SQUAMOUS CELL CARCINOMA OF THE SKIN - No evidence of recurrence today - No lymphadenopathy - Recommend  regular full body skin exams - Recommend daily broad spectrum sunscreen SPF 30+ to sun-exposed areas, reapply every 2 hours as needed.  - Call if any new or changing lesions are noted between office visits   SQUAMOUS CELL CARCINOMA  OF SKIN   NEOPLASM OF UNCERTAIN BEHAVIOR OF SKIN Left Upper Arm - Posterior Skin / nail biopsy Type of biopsy: tangential   Informed consent: discussed and consent obtained   Timeout: patient name, date of birth, surgical site, and procedure verified   Procedure prep:  Patient was prepped and draped in usual sterile fashion Prep type:  Isopropyl alcohol Anesthesia: the lesion was anesthetized in a standard fashion   Anesthetic:  1% lidocaine w/ epinephrine 1-100,000 buffered w/ 8.4% NaHCO3 Instrument used: flexible razor blade   Hemostasis achieved with: pressure, aluminum chloride and electrodesiccation   Outcome: patient tolerated procedure well   Post-procedure details: sterile dressing applied and wound care instructions given   Dressing type: bandage and petrolatum   Specimen 1 - Surgical pathology Differential Diagnosis: NMSC vs other  Check Margins: No AK (ACTINIC KERATOSIS) (10) Left leg x 3, right leg x 1, right post lower leg x 1, left chest x 1, mid chest x 1, mid chest inf x 1, right chest x 1, right chest lat x 1 (10) Destruction of lesion - Left leg x 3, right leg x 1, right post lower leg x 1, left chest x 1, mid chest x 1, mid chest inf x 1, right chest x 1, right chest lat x 1 (10) Complexity: simple   Destruction method: cryotherapy   Informed consent: discussed and consent obtained   Timeout:  patient name, date of birth, surgical site, and procedure verified Lesion destroyed using liquid nitrogen: Yes   Region frozen until ice ball extended beyond lesion: Yes   Outcome: patient tolerated procedure well with no complications   Post-procedure details: wound care instructions given   PURPURA (HCC)   SEBORRHEIC KERATOSIS   CHERRY ANGIOMA   LENTIGINES   HISTORY OF BASAL CELL CANCER   HISTORY OF SQUAMOUS CELL CARCINOMA   ACTINIC SKIN DAMAGE    No follow-ups on file.  I, Eliot Guernsey, CMA, am acting as scribe for Deneise Finlay, MD  .   Documentation: I have reviewed the above documentation for accuracy and completeness, and I agree with the above.  Deneise Finlay, MD

## 2023-11-07 NOTE — Patient Instructions (Addendum)
 Cryotherapy Aftercare  Wash gently with soap and water everyday.   Apply Vaseline and Band-Aid daily until healed.   Skin Education : We counseled the patient regarding the following: Sun screen (SPF 30 or greater) should be applied during peak UV exposure (between 10am and 2pm) and reapplied after exercise or swimming.  The ABCDEs of melanoma were reviewed with the patient, and the importance of monthly self-examination of moles was emphasized. Should any moles change in shape or color, or itch, bleed or burn, pt will contact our office for evaluation sooner then their interval appointment.  Plan: Sunscreen Recommendations We recommended a broad spectrum sunscreen with a SPF of 30 or higher.  SPF 30 sunscreens block approximately 97 percent of the sun's harmful rays. Sunscreens should be applied at least 15 minutes prior to expected sun exposure and then every 2 hours after that as long as sun exposure continues. If swimming or exercising sunscreen should be reapplied every 45 minutes to an hour after getting wet or sweating. One ounce, or the equivalent of a shot glass full of sunscreen, is adequate to protect the skin not covered by a bathing suit. We also recommended a lip balm with a sunscreen as well. Sun protective clothing can be used in lieu of sunscreen but must be worn the entire time you are exposed to the sun's rays.   Important Information   Due to recent changes in healthcare laws, you may see results of your pathology and/or laboratory studies on MyChart before the doctors have had a chance to review them. We understand that in some cases there may be results that are confusing or concerning to you. Please understand that not all results are received at the same time and often the doctors may need to interpret multiple results in order to provide you with the best plan of care or course of treatment. Therefore, we ask that you please give [place] 2 business days to thoroughly review all  your results before contacting the office for clarification. Should we see a critical lab result, you will be contacted sooner.     If You Need Anything After Your Visit   If you have any questions or concerns for your doctor, please call our main line at [phone]. If no one answers, please leave a voicemail as directed and we will return your call as soon as possible. Messages left after 4 pm will be answered the following business day.    You may also send [place] a message via MyChart. We typically respond to MyChart messages within 1-2 business days.  For prescription refills, please ask your pharmacy to contact our office. Our fax number is [phone].  If you have an urgent issue when the clinic is closed that cannot wait until the next business day, you can page your doctor at the number below.     Please note that while we do our best to be available for urgent issues outside of office hours, we are not available 24/7.    If you have an urgent issue and are unable to reach [place], you may choose to seek medical care at your doctor's office, retail clinic, urgent care center, or emergency room.   If you have a medical emergency, please immediately call 911 or go to the emergency department. In the event of inclement weather, please call our main line at [phone] for an update on the status of any delays or closures.  Dermatology Medication Tips: Please keep the boxes that  topical medications come in in order to help keep track of the instructions about where and how to use these. Pharmacies typically print the medication instructions only on the boxes and not directly on the medication tubes.   If your medication is too expensive, please contact our office at [phone] or send [place] a message through MyChart.    We are unable to tell what your co-pay for medications will be in advance as this is different depending on your insurance coverage. However, we may be able to find a  substitute medication at lower cost or fill out paperwork to get insurance to cover a needed medication.    If a prior authorization is required to get your medication covered by your insurance company, please allow [place] 1-2 business days to complete this process.   Drug prices often vary depending on where the prescription is filled and some pharmacies may offer cheaper prices.   The website www.goodrx.com contains coupons for medications through different pharmacies. The prices here do not account for what the cost may be with help from insurance (it may be cheaper with your insurance), but the website can give you the price if you did not use any insurance.  - You can print the associated coupon and take it with your prescription to the pharmacy.  - You may also stop by our office during regular business hours and pick up a GoodRx coupon card.  - If you need your prescription sent electronically to a different pharmacy, notify our office through [facility] or by phone at [phone]

## 2023-11-09 LAB — SURGICAL PATHOLOGY

## 2023-11-11 ENCOUNTER — Ambulatory Visit: Payer: Self-pay | Admitting: Dermatology

## 2023-12-10 ENCOUNTER — Encounter: Payer: Self-pay | Admitting: Internal Medicine

## 2023-12-10 ENCOUNTER — Ambulatory Visit: Payer: 59 | Admitting: Internal Medicine

## 2023-12-10 VITALS — BP 130/86 | HR 66 | Temp 97.3°F | Resp 18 | Ht <= 58 in | Wt 204.0 lb

## 2023-12-10 DIAGNOSIS — G894 Chronic pain syndrome: Secondary | ICD-10-CM

## 2023-12-10 DIAGNOSIS — I10 Essential (primary) hypertension: Secondary | ICD-10-CM

## 2023-12-10 DIAGNOSIS — E039 Hypothyroidism, unspecified: Secondary | ICD-10-CM

## 2023-12-10 DIAGNOSIS — E7800 Pure hypercholesterolemia, unspecified: Secondary | ICD-10-CM | POA: Diagnosis not present

## 2023-12-10 MED ORDER — HYDROCODONE-ACETAMINOPHEN 5-325 MG PO TABS: 1.0000 | ORAL_TABLET | Freq: Four times a day (QID) | ORAL | 0 refills | Status: DC | PRN

## 2023-12-10 MED ORDER — DIAZEPAM 5 MG PO TABS: 5.0000 mg | ORAL_TABLET | Freq: Two times a day (BID) | ORAL | 1 refills | Status: DC | PRN

## 2023-12-10 NOTE — Addendum Note (Signed)
 Addended by: Adelfa Adolph on: 12/10/2023 10:23 AM   Modules accepted: Orders

## 2023-12-10 NOTE — Assessment & Plan Note (Signed)
 Her BP is currently controlled.  We will continue on her current medications.

## 2023-12-10 NOTE — Progress Notes (Signed)
 Office Visit  Subjective   Patient ID: Jenny Yates   DOB: May 11, 1961   Age: 63 y.o.   MRN: [id number]   Chief Complaint Chief Complaint  Patient presents with   Follow-up    67m f/u     History of Present Illness The patient is a 63 year old Caucasian/White female who presents for a follow-up evaluation of hypertension.  On her last visit, we did switch her back to benicar  and she has been doing well since.  Over the interim, she has not had any problems.  I have tried her on Norvasc in the past but she developed edema where this medicine was stopped.  Her edema resolved and she continues to wear compression hose.  The patient has been checking her blood pressure at home and her systolic BP is running 120-140's range.  The patient's current medications include: olemisartan 20mg  BID, Bystolic  20mg  daily, HCTZ 25mg  daily, and hydralazine  50mg  BID.  Otherwise the patient denies any headache, visual changes, dizziness, lightheadness, chest pain, shortness of breath, weakness/numbness, and edema. She reports there have been no other symptoms noted.   Jenny Yates returns today for routine followup on her cholesterol. This past year, her cholesterol was not controlled and we changed her lipitor to crestor .  Overall, she states she is doing well and is without any complaints or problems at this time. In the past, we held off on a statin due to her history of elevated LFT's.  She specifically denies abdominal pain, nausea, vomiting, diarrhea, or myalgias.  She is on Crestor  20mg  qhs.  She is fasting in anticipation for labs today.   The patient is a 63 year old Caucasian/White female who returns for a regularly scheduled thyroid check. Since the last visit, there has been no overall change in her status. She remains on Synthroid  100 mcg daily. She claims to have no symptoms suggestive of thyroid imbalance specifically denying cold intolerance, heat intolerance, anxiety, unexplained weight changes, and  insomnia.  The patient also returns for follow up of her chronic pain with her history of of probable osteoarthritis with joint pain and swelling in the hands, knees, ankles and her shoulders.  This past year, she did stop her meloxicam as she read that using meloxicam.  There has no change in her pain since her last visit.  She has been doing a lot of yoga and watching her diet.  We have done a lab workup for her arthritic pain which was essentially normal but her RMSF serology was abnormal and we treated her at that time.  She was also having fatigue as well as some generalized mild weakness.  She states all of her joints were hurting that is described as a dull constant aching pain that was moderate to severe at times.  She did have a tick bite on 10/30/2019 which was removed.  She denies any fevers, chills, rash, headaches or other problems at that time.  I gave her doxycycline as described above and her above symptoms resolved.  She never did go to rheumatology.  Again, [name] where she had a ruptured lumbar disc.  She attributes this to a combination of weight training and a slip and fall in her bathtub in 2007.  She was seen by neurosurgery with Dr. Larrie Po and ultimately underwent a left L5-S1 microdiscectomy in 06/2006.  Since then she has had intermittent chronic low back pain that does not affect her daily  function.  She does use meloxicam for her pain.  She also remains on hydrocodone /APAP 5/325 where she uses this 2-3 times per week. She denies any new weakness/numbness or loss of bowel/bladder function.     Past Medical History Past Medical History:  Diagnosis Date   Anemia    Anxiety    Collagenous colitis    Depression    Hyperlipidemia    Hypertension    Hypothyroidism    Lumbar herniated disc    Panic disorder      Allergies Allergies  Allergen Reactions   Azithromycin    Codeine    Epinephrine     Oral   Iodine      Medications  Current  Outpatient Medications:    ALPRAZolam  (XANAX ) 0.5 MG tablet, Take 1 tablet (0.5 mg total) by mouth 3 (three) times daily as needed., Disp: 90 tablet, Rfl: 2   budesonide  (ENTOCORT EC ) 3 MG 24 hr capsule, Take 9 mg by mouth daily., Disp: , Rfl:    budesonide  (ENTOCORT EC ) 3 MG 24 hr capsule, Take 3 capsules (9 mg total) by mouth daily., Disp: 270 capsule, Rfl: 3   budesonide  (ENTOCORT EC ) 3 MG 24 hr capsule, TAKE 3 CAPSULES BY MOUTH EVERY DAY, Disp: 270 capsule, Rfl: 1   celecoxib  (CELEBREX ) 200 MG capsule, Take 1 capsule (200 mg total) by mouth daily., Disp: 90 capsule, Rfl: 3   diazepam (VALIUM) 5 MG tablet, Take 5 mg by mouth 2 (two) times daily as needed., Disp: , Rfl:    hydrALAZINE  (APRESOLINE ) 50 MG tablet, Take 1 tablet (50 mg total) by mouth in the morning and at bedtime. TAKE 1 TABLET 2 TIMES DAILYWITH FOOD, Disp: 180 tablet, Rfl: 1   hydrochlorothiazide  (HYDRODIURIL ) 25 MG tablet, TAKE 1 TABLET DAILY, Disp: 90 tablet, Rfl: 1   HYDROcodone -acetaminophen  (NORCO/VICODIN) 5-325 MG tablet, Take 1 tablet by mouth every 6 (six) hours as needed for moderate pain (pain score 4-6)., Disp: 60 tablet, Rfl: 0   levothyroxine  (SYNTHROID ) 100 MCG tablet, Take 1 tablet (100 mcg total) by mouth daily., Disp: 90 tablet, Rfl: 3   nebivolol  (BYSTOLIC ) 10 MG tablet, TAKE 2 TABLETS DAILY, Disp: 180 tablet, Rfl: 1   olmesartan  (BENICAR ) 20 MG tablet, Take 1 tablet (20 mg total) by mouth in the morning and at bedtime., Disp: 180 tablet, Rfl: 3   rosuvastatin  (CRESTOR ) 20 MG tablet, Take 1 tablet (20 mg total) by mouth daily., Disp: 90 tablet, Rfl: 1   Review of Systems Review of Systems  Constitutional:  Negative for chills and fever.  Eyes:  Negative for blurred vision and double vision.  Respiratory:  Negative for shortness of breath.   Cardiovascular:  Negative for chest pain, palpitations and leg swelling.  Gastrointestinal:  Negative for abdominal pain, constipation, diarrhea, nausea and vomiting.   Genitourinary:  Negative for frequency.  Musculoskeletal:  Negative for myalgias.  Skin:  Negative for itching and rash.  Neurological:  Negative for dizziness, weakness and headaches.  Endo/Heme/Allergies:  Negative for polydipsia.       Objective:    Vitals BP 130/86   Pulse 66   Temp (!) 97.3 F (36.3 C) (Temporal)   Resp 18   Ht 4' 10 (1.473 m)   Wt 204 lb (92.5 kg)   SpO2 99%   BMI 42.64 kg/m    Physical Examination Physical Exam Constitutional:      Appearance: Normal appearance. She is not ill-appearing.   Cardiovascular:     Rate and  Rhythm: Normal rate and regular rhythm.     Pulses: Normal pulses.     Heart sounds: No murmur heard.    No friction rub. No gallop.  Pulmonary:     Effort: Pulmonary effort is normal. No respiratory distress.     Breath sounds: No wheezing, rhonchi or rales.  Abdominal:     General: Bowel sounds are normal. There is no distension.     Palpations: Abdomen is soft.     Tenderness: There is no abdominal tenderness.   Musculoskeletal:     Right lower leg: No edema.     Left lower leg: No edema.   Skin:    General: Skin is warm and dry.     Findings: No rash.   Neurological:     General: No focal deficit present.     Mental Status: She is alert and oriented to person, place, and time.   Psychiatric:        Mood and Affect: Mood normal.        Behavior: Behavior normal.        Assessment & Plan:   Essential hypertension Her BP is currently controlled.  We will continue on her current medications.  Hypothyroidism We will check her TFT's today.  She seems euthyroid.  Hypercholesterolemia We will check her FLP again today.  She will remain on crestor  at this time.  Chronic pain syndrome We will refill her hydrococodone/APAP today.  I did check her [place] CXR/PDMP.    Return in about 3 months (around 03/11/2024).   Wayne Haines, MD

## 2023-12-10 NOTE — Assessment & Plan Note (Signed)
 We will check her FLP again today.  She will remain on crestor  at this time.

## 2023-12-10 NOTE — Assessment & Plan Note (Signed)
We will check her TFT's today.  She seems euthyroid.

## 2023-12-10 NOTE — Assessment & Plan Note (Signed)
 We will refill her hydrococodone/APAP today.  I did check her [place] CXR/PDMP.

## 2023-12-11 LAB — CMP14 + ANION GAP
ALT: 49 IU/L — ABNORMAL HIGH (ref 0–32)
AST: 36 IU/L (ref 0–40)
Albumin: 4.4 g/dL (ref 3.9–4.9)
Alkaline Phosphatase: 68 IU/L (ref 44–121)
Anion Gap: 20 mmol/L — ABNORMAL HIGH (ref 10.0–18.0)
BUN/Creatinine Ratio: 33 — ABNORMAL HIGH (ref 12–28)
BUN: 29 mg/dL — ABNORMAL HIGH (ref 8–27)
Bilirubin Total: 0.4 mg/dL (ref 0.0–1.2)
CO2: 23 mmol/L (ref 20–29)
Calcium: 10.7 mg/dL — ABNORMAL HIGH (ref 8.7–10.3)
Chloride: 96 mmol/L (ref 96–106)
Creatinine, Ser: 0.87 mg/dL (ref 0.57–1.00)
Globulin, Total: 2 g/dL (ref 1.5–4.5)
Glucose: 91 mg/dL (ref 70–99)
Potassium: 4.5 mmol/L (ref 3.5–5.2)
Sodium: 139 mmol/L (ref 134–144)
Total Protein: 6.4 g/dL (ref 6.0–8.5)
eGFR: 75 mL/min/{1.73_m2} (ref >59)

## 2023-12-11 LAB — LIPID PANEL
Chol/HDL Ratio: 2.5 ratio (ref 0.0–4.4)
Cholesterol, Total: 241 mg/dL — ABNORMAL HIGH (ref 100–199)
HDL: 95 mg/dL (ref >39)
LDL Chol Calc (NIH): 116 mg/dL — ABNORMAL HIGH (ref 0–99)
Triglycerides: 175 mg/dL — ABNORMAL HIGH (ref 0–149)
VLDL Cholesterol Cal: 30 mg/dL (ref 5–40)

## 2023-12-11 LAB — T4, FREE: Free T4: 1.44 ng/dL (ref 0.82–1.77)

## 2023-12-11 LAB — TSH: TSH: 1.87 u[IU]/mL (ref 0.450–4.500)

## 2023-12-13 ENCOUNTER — Ambulatory Visit: Payer: Self-pay

## 2023-12-13 ENCOUNTER — Other Ambulatory Visit: Payer: Self-pay

## 2023-12-13 MED ORDER — FLUTICASONE PROPIONATE 50 MCG/ACT NA SUSP: 1.0000 | Freq: Every day | NASAL | 0 refills | Status: DC

## 2023-12-13 NOTE — Progress Notes (Signed)
 Patient called.  Patient aware.  I have called and informed the patients husband  Her cholesterol is not controlled.  Is she taking her crestor ? .  Pt aware.

## 2023-12-13 NOTE — Progress Notes (Signed)
 New Rx

## 2023-12-16 ENCOUNTER — Other Ambulatory Visit: Payer: Self-pay | Admitting: Internal Medicine

## 2023-12-17 ENCOUNTER — Other Ambulatory Visit: Payer: Self-pay

## 2023-12-18 ENCOUNTER — Other Ambulatory Visit: Payer: Self-pay | Admitting: Internal Medicine

## 2023-12-18 ENCOUNTER — Other Ambulatory Visit: Payer: Self-pay

## 2023-12-18 MED ORDER — DIAZEPAM 5 MG PO TABS: 5.0000 mg | ORAL_TABLET | Freq: Two times a day (BID) | ORAL | 1 refills | Status: DC | PRN

## 2023-12-18 MED ORDER — ROSUVASTATIN CALCIUM 40 MG PO TABS: 40.0000 mg | ORAL_TABLET | Freq: Every day | ORAL | 3 refills | Status: DC

## 2023-12-18 NOTE — Progress Notes (Signed)
 Dose change.

## 2023-12-25 ENCOUNTER — Other Ambulatory Visit: Payer: Self-pay | Admitting: Internal Medicine

## 2023-12-25 MED ORDER — ALPRAZOLAM 0.5 MG PO TABS: 0.5000 mg | ORAL_TABLET | Freq: Three times a day (TID) | ORAL | 2 refills | Status: DC | PRN

## 2024-01-13 ENCOUNTER — Other Ambulatory Visit: Payer: Self-pay | Admitting: Internal Medicine

## 2024-01-22 ENCOUNTER — Ambulatory Visit: Admitting: Internal Medicine

## 2024-01-22 ENCOUNTER — Encounter: Payer: Self-pay | Admitting: Internal Medicine

## 2024-01-22 VITALS — BP 156/74 | HR 73 | Temp 97.2°F | Resp 18 | Wt 206.8 lb

## 2024-01-22 DIAGNOSIS — M5416 Radiculopathy, lumbar region: Secondary | ICD-10-CM | POA: Diagnosis not present

## 2024-01-22 LAB — POCT URINALYSIS DIPSTICK
Bilirubin, UA: NEGATIVE
Blood, UA: NEGATIVE
Glucose, UA: NEGATIVE
Ketones, UA: NEGATIVE
Nitrite, UA: NEGATIVE
Protein, UA: NEGATIVE
Spec Grav, UA: 1.02 (ref 1.010–1.025)
Urobilinogen, UA: 0.2 U/dL
pH, UA: 7 (ref 5.0–8.0)

## 2024-01-22 MED ORDER — METHYLPREDNISOLONE 4 MG PO TBPK: ORAL_TABLET | ORAL | 0 refills | Status: DC

## 2024-01-22 NOTE — Assessment & Plan Note (Signed)
 She has a new lumbar radiculopathy with pain radiating down especially her right buttock and leg.  I note no weakness but she is having some numbness and tingling.  Her pain is severe at times which it has never been in the past.  We will obtain a MRI of her lumbar spine and start her on a medrol  dose pak.  She can use her hydrocodoone/APAP as needed.  Further care will depend on her MRI of her lumbar spine.

## 2024-01-22 NOTE — Addendum Note (Signed)
 Addended by: LENETTA LACKS on: 01/22/2024 05:05 PM   Modules accepted: Orders

## 2024-01-22 NOTE — Progress Notes (Signed)
 Office Visit  Subjective   Patient ID: Jenny Yates   DOB: August 27, 1960   Age: 63 y.o.   MRN: [id number]   Chief Complaint Chief Complaint  Patient presents with   Back Pain    Pt in today for back pain. The patient reports about 6 weeks ago she started having back pains, denies any falls or injuries.      History of Present Illness Jenny Yates returns today for an acute visit due to acute lower back pain.  She does have a history of back pain where she had a ruptured lumbar disc.  She attributes this to a combination of weight training and a slip and fall in her bathtub in 2007.  She was seen by neurosurgery with Dr. Mavis and ultimately underwent a left L5-S1 microdiscectomy in 06/2006.  Since then she has had intermittent chronic low back pain that does not affect her daily function.  She does use meloxicam for her pain.  She also remains on hydrocodone /APAP 5/325 where she uses this 2-3 times per week. She states that her back pain worsened about 6 weeks ago where she had lower bilateral lumbar pain with radiation into her bilateral buttocks and down the back of each leg down to her knees.  This pain was a sharp burning pain that was intermittent.  Standing makes her pain worse.  Lying down improves her pain.  Her pain though has steadily worsened over the last 6 weeks.   She denies any new weakness/numbness or loss of bowel/bladder function.  She has occasional numbness/tingling down both legs but again no weakness. She has tried massage therapy and icing her back and this initially helped but no longer helps at this time.       Past Medical History Past Medical History:  Diagnosis Date   Anemia    Anxiety    Collagenous colitis    Depression    Hyperlipidemia    Hypertension    Hypothyroidism    Lumbar herniated disc    Panic disorder      Allergies Allergies  Allergen Reactions   Azithromycin    Codeine    Epinephrine     Oral   Iodine      Medications  Current  Outpatient Medications:    ALPRAZolam  (XANAX ) 0.5 MG tablet, Take 1 tablet (0.5 mg total) by mouth 3 (three) times daily as needed., Disp: 90 tablet, Rfl: 2   budesonide  (ENTOCORT EC ) 3 MG 24 hr capsule, TAKE 3 CAPSULES BY MOUTH EVERY DAY, Disp: 270 capsule, Rfl: 1   celecoxib  (CELEBREX ) 200 MG capsule, Take 1 capsule (200 mg total) by mouth daily., Disp: 90 capsule, Rfl: 3   diazepam  (VALIUM ) 5 MG tablet, Take 1 tablet (5 mg total) by mouth 2 (two) times daily as needed., Disp: 90 tablet, Rfl: 1   fluticasone  (FLONASE ) 50 MCG/ACT nasal spray, Place 1 spray into both nostrils daily., Disp: 1 g, Rfl: 0   hydrALAZINE  (APRESOLINE ) 50 MG tablet, Take 1 tablet (50 mg total) by mouth in the morning and at bedtime. TAKE 1 TABLET 2 TIMES DAILYWITH FOOD, Disp: 180 tablet, Rfl: 1   hydrochlorothiazide  (HYDRODIURIL ) 25 MG tablet, TAKE 1 TABLET DAILY, Disp: 90 tablet, Rfl: 1   HYDROcodone -acetaminophen  (NORCO/VICODIN) 5-325 MG tablet, Take 1 tablet by mouth every 6 (six) hours as needed for moderate pain (pain score 4-6)., Disp: 60 tablet, Rfl: 0   levothyroxine  (SYNTHROID ) 100 MCG tablet, Take 1 tablet by mouth once daily, Disp: 30  tablet, Rfl: 0   nebivolol  (BYSTOLIC ) 10 MG tablet, TAKE 2 TABLETS DAILY, Disp: 180 tablet, Rfl: 1   olmesartan  (BENICAR ) 20 MG tablet, Take 1 tablet (20 mg total) by mouth in the morning and at bedtime., Disp: 180 tablet, Rfl: 3   rosuvastatin  (CRESTOR ) 40 MG tablet, Take 1 tablet (40 mg total) by mouth daily., Disp: 30 tablet, Rfl: 3   Review of Systems Review of Systems  Constitutional:  Negative for chills and fever.  Respiratory:  Negative for shortness of breath.   Cardiovascular:  Negative for chest pain.  Gastrointestinal:  Positive for nausea. Negative for abdominal pain, constipation, diarrhea and vomiting.  Musculoskeletal:  Positive for back pain. Negative for myalgias.  Neurological:  Negative for dizziness, weakness and headaches.       Objective:     Vitals BP (!) 156/74   Pulse 73   Temp (!) 97.2 F (36.2 C) (Temporal)   Resp 18   Wt 206 lb 12.8 oz (93.8 kg)   SpO2 99%   BMI 43.22 kg/m    Physical Examination Physical Exam Constitutional:      Appearance: Normal appearance. She is not ill-appearing.  Cardiovascular:     Rate and Rhythm: Normal rate and regular rhythm.     Pulses: Normal pulses.     Heart sounds: No murmur heard.    No friction rub. No gallop.  Pulmonary:     Effort: Pulmonary effort is normal. No respiratory distress.     Breath sounds: No wheezing, rhonchi or rales.  Abdominal:     General: Bowel sounds are normal. There is no distension.     Palpations: Abdomen is soft.     Tenderness: There is no abdominal tenderness.  Musculoskeletal:     Right lower leg: No edema.     Left lower leg: No edema.  Skin:    General: Skin is warm and dry.     Findings: No rash.  Neurological:     Mental Status: She is alert.     Comments: Strength is 5/5 throughout.        Assessment & Plan:   Lumbar radiculopathy, acute She has a new lumbar radiculopathy with pain radiating down especially her right buttock and leg.  I note no weakness but she is having some numbness and tingling.  Her pain is severe at times which it has never been in the past.  We will obtain a MRI of her lumbar spine and start her on a medrol  dose pak.  She can use her hydrocodoone/APAP as needed.  Further care will depend on her MRI of her lumbar spine.    No follow-ups on file.   Selinda Fleeta Finger, MD

## 2024-01-25 ENCOUNTER — Ambulatory Visit
Admission: RE | Admit: 2024-01-25 | Discharge: 2024-01-25 | Disposition: A | Discharge status: Home / Self Care | Source: Ambulatory Visit | Attending: Internal Medicine | Admitting: Internal Medicine

## 2024-01-25 ENCOUNTER — Encounter (HOSPITAL_COMMUNITY): Payer: Self-pay

## 2024-01-25 ENCOUNTER — Ambulatory Visit (HOSPITAL_COMMUNITY)
Admission: RE | Admit: 2024-01-25 | Discharge: 2024-01-25 | Disposition: A | Discharge status: Home / Self Care | Source: Ambulatory Visit | Attending: Internal Medicine | Admitting: Internal Medicine

## 2024-01-25 ENCOUNTER — Other Ambulatory Visit: Payer: Self-pay | Admitting: Internal Medicine

## 2024-01-25 DIAGNOSIS — M4727 Other spondylosis with radiculopathy, lumbosacral region: Secondary | ICD-10-CM | POA: Diagnosis not present

## 2024-01-25 DIAGNOSIS — M5416 Radiculopathy, lumbar region: Secondary | ICD-10-CM

## 2024-01-25 DIAGNOSIS — M4316 Spondylolisthesis, lumbar region: Secondary | ICD-10-CM | POA: Diagnosis not present

## 2024-02-04 ENCOUNTER — Other Ambulatory Visit: Payer: Self-pay

## 2024-02-04 DIAGNOSIS — Z6841 Body Mass Index (BMI) 40.0 and over, adult: Secondary | ICD-10-CM | POA: Diagnosis not present

## 2024-02-04 DIAGNOSIS — M4316 Spondylolisthesis, lumbar region: Secondary | ICD-10-CM | POA: Diagnosis not present

## 2024-02-04 MED ORDER — FLUTICASONE PROPIONATE 50 MCG/ACT NA SUSP: 1.0000 | Freq: Every day | NASAL | 0 refills | Status: DC

## 2024-02-04 MED ORDER — HYDRALAZINE HCL 50 MG PO TABS: 50.0000 mg | ORAL_TABLET | Freq: Two times a day (BID) | ORAL | 1 refills | Status: DC

## 2024-02-07 ENCOUNTER — Other Ambulatory Visit: Payer: Self-pay | Admitting: Neurosurgery

## 2024-02-07 DIAGNOSIS — H4322 Crystalline deposits in vitreous body, left eye: Secondary | ICD-10-CM | POA: Diagnosis not present

## 2024-02-07 DIAGNOSIS — Z973 Presence of spectacles and contact lenses: Secondary | ICD-10-CM | POA: Diagnosis not present

## 2024-02-07 DIAGNOSIS — H02834 Dermatochalasis of left upper eyelid: Secondary | ICD-10-CM | POA: Diagnosis not present

## 2024-02-07 DIAGNOSIS — H25813 Combined forms of age-related cataract, bilateral: Secondary | ICD-10-CM | POA: Diagnosis not present

## 2024-02-07 DIAGNOSIS — M4316 Spondylolisthesis, lumbar region: Secondary | ICD-10-CM

## 2024-02-07 DIAGNOSIS — H02831 Dermatochalasis of right upper eyelid: Secondary | ICD-10-CM | POA: Diagnosis not present

## 2024-02-08 ENCOUNTER — Encounter: Payer: Self-pay | Admitting: Neurosurgery

## 2024-02-11 ENCOUNTER — Other Ambulatory Visit: Payer: Self-pay | Admitting: Internal Medicine

## 2024-02-11 ENCOUNTER — Encounter: Payer: Self-pay | Admitting: Neurosurgery

## 2024-02-12 ENCOUNTER — Encounter: Payer: Self-pay | Admitting: Neurosurgery

## 2024-02-14 ENCOUNTER — Encounter: Payer: Self-pay | Admitting: Neurosurgery

## 2024-02-19 DIAGNOSIS — M4726 Other spondylosis with radiculopathy, lumbar region: Secondary | ICD-10-CM | POA: Diagnosis not present

## 2024-02-19 DIAGNOSIS — R262 Difficulty in walking, not elsewhere classified: Secondary | ICD-10-CM | POA: Diagnosis not present

## 2024-02-26 ENCOUNTER — Encounter: Payer: Self-pay | Admitting: Neurosurgery

## 2024-02-26 DIAGNOSIS — R262 Difficulty in walking, not elsewhere classified: Secondary | ICD-10-CM | POA: Diagnosis not present

## 2024-02-26 DIAGNOSIS — M4726 Other spondylosis with radiculopathy, lumbar region: Secondary | ICD-10-CM | POA: Diagnosis not present

## 2024-02-28 DIAGNOSIS — M4726 Other spondylosis with radiculopathy, lumbar region: Secondary | ICD-10-CM | POA: Diagnosis not present

## 2024-02-28 DIAGNOSIS — R262 Difficulty in walking, not elsewhere classified: Secondary | ICD-10-CM | POA: Diagnosis not present

## 2024-03-04 DIAGNOSIS — M4726 Other spondylosis with radiculopathy, lumbar region: Secondary | ICD-10-CM | POA: Diagnosis not present

## 2024-03-04 DIAGNOSIS — R262 Difficulty in walking, not elsewhere classified: Secondary | ICD-10-CM | POA: Diagnosis not present

## 2024-03-06 DIAGNOSIS — M4726 Other spondylosis with radiculopathy, lumbar region: Secondary | ICD-10-CM | POA: Diagnosis not present

## 2024-03-06 DIAGNOSIS — R262 Difficulty in walking, not elsewhere classified: Secondary | ICD-10-CM | POA: Diagnosis not present

## 2024-03-10 ENCOUNTER — Ambulatory Visit: Admitting: Internal Medicine

## 2024-03-10 ENCOUNTER — Encounter: Payer: Self-pay | Admitting: Internal Medicine

## 2024-03-10 ENCOUNTER — Other Ambulatory Visit: Payer: Self-pay | Admitting: Internal Medicine

## 2024-03-10 VITALS — BP 128/86 | HR 68 | Temp 98.6°F | Resp 16 | Ht <= 58 in | Wt 216.0 lb

## 2024-03-10 DIAGNOSIS — G894 Chronic pain syndrome: Secondary | ICD-10-CM | POA: Diagnosis not present

## 2024-03-10 MED ORDER — DIAZEPAM 5 MG PO TABS: 5.0000 mg | ORAL_TABLET | Freq: Two times a day (BID) | ORAL | 1 refills | Status: DC | PRN

## 2024-03-10 MED ORDER — HYDROCODONE-ACETAMINOPHEN 5-325 MG PO TABS: 1.0000 | ORAL_TABLET | Freq: Four times a day (QID) | ORAL | 0 refills | Status: DC | PRN

## 2024-03-10 MED ORDER — LEVOTHYROXINE SODIUM 100 MCG PO TABS: 100.0000 ug | ORAL_TABLET | Freq: Every day | ORAL | 0 refills | Status: DC

## 2024-03-10 NOTE — Assessment & Plan Note (Signed)
 I reviewed her notes from Dr. Mavis.  She has an appointment this coming week for followup with him.  We will refill her pain meds.   Her [facility] CSR/PDMP was reviewed.

## 2024-03-10 NOTE — Progress Notes (Signed)
 Office Visit  Subjective   Patient ID: Jenny Yates   DOB: December 23, 1960   Age: 63 y.o.   MRN: [id number]   Chief Complaint Chief Complaint  Patient presents with   Follow-up    3 mo follow up     History of Present Illness The patient returns for followup of her chronic pain with her history of of probable osteoarthritis with joint pain and swelling in the hands, knees, ankles and her shoulders.  On her last visit in 01/22/2024, she had a new  lumbar radiculopathy with pain radiating down especially her right buttock and leg.  I note no weakness but she is having some numbness and tingling.  Her pain was severe at times which it has never been in the past.  I started her on a medrol  dose pak and ordered a MRI of her lumbar spine.  Insurance would not authorize the MRI so we sent her to neurosurgery for evaluation.  Today, she states that the medrol  dose pak had no effect.  She has been using her hydrocodoone/APAP as needed which she uses 1-2 times per day and she also augments this with tylenol  with an additional 1300-1600mg  of tylenol  per day.  I did a xray of her lumbar spine on 01/25/2024 and this showed degenerative changes seen greatest at L4-L5 and L5-S1.  She did see Dr. Mavis on 02/04/2024 where he felt she had lumbar spondylolisthesis, lumbar radiculopathy, lumbar spondylosis.  He reviewed the patient's imaging studies with them and pointed out the abnormalities. He also recommended further workup with a lumbar MRI to better evaluate her spondylolisthesis and reviewed evaluate L5-S1.Presumably she has significant stenosis at L4-5.  Their insurance denied the MRI as well and the patient tells me that they were sent to physical therapy and chiropractor.  She has completed so far 4 weeks of physical therapy and 3.5 weeks of chiropractor care going 3 times per week.    The patient does have chronic pain form osteoarthitis.  We have done a lab workup for her arthritic pain which was essentially  normal but her RMSF serology was abnormal and we treated her at that time.  She was also having fatigue as well as some generalized mild weakness.  She states all of her joints were hurting that is described as a dull constant aching pain that was moderate to severe at times.  She did have a tick bite on 10/30/2019 which was removed.  She denies any fevers, chills, rash, headaches or other problems at that time.  I gave her doxycycline as described above and her above symptoms resolved.  She never did go to rheumatology.  Again, Katy also has a history of back pain where she had a ruptured lumbar disc.  She attributes this to a combination of weight training and a slip and fall in her bathtub in 2007.  She was seen by neurosurgery with Dr. Mavis and ultimately underwent a left L5-S1 microdiscectomy in 06/2006.  Since then she has had intermittent chronic low back pain that does not affect her daily function.  She does use meloxicam for her pain.  She also remains on hydrocodone /APAP 5/325 where she uses this 2-3 times per week. She denies any new weakness/numbness or loss of bowel/bladder function.     Past Medical History Past Medical History:  Diagnosis Date   Anemia    Anxiety    Collagenous colitis    Depression    Hyperlipidemia    Hypertension  Hypothyroidism    Lumbar herniated disc    Panic disorder      Allergies Allergies  Allergen Reactions   Azithromycin    Codeine    Epinephrine     Oral   Iodine      Medications  Current Outpatient Medications:    ALPRAZolam  (XANAX ) 0.5 MG tablet, Take 1 tablet (0.5 mg total) by mouth 3 (three) times daily as needed., Disp: 90 tablet, Rfl: 2   budesonide  (ENTOCORT EC ) 3 MG 24 hr capsule, TAKE 3 CAPSULES BY MOUTH EVERY DAY, Disp: 270 capsule, Rfl: 1   celecoxib  (CELEBREX ) 200 MG capsule, Take 1 capsule (200 mg total) by mouth daily., Disp: 90 capsule, Rfl: 3   diazepam  (VALIUM ) 5 MG tablet, Take 1 tablet (5 mg total) by mouth 2 (two)  times daily as needed., Disp: 90 tablet, Rfl: 1   fluticasone  (FLONASE ) 50 MCG/ACT nasal spray, Place 1 spray into both nostrils daily., Disp: 1 g, Rfl: 0   hydrALAZINE  (APRESOLINE ) 50 MG tablet, Take 1 tablet (50 mg total) by mouth in the morning and at bedtime. TAKE 1 TABLET 2 TIMES DAILYWITH FOOD, Disp: 180 tablet, Rfl: 1   hydrochlorothiazide  (HYDRODIURIL ) 25 MG tablet, TAKE 1 TABLET DAILY, Disp: 90 tablet, Rfl: 1   HYDROcodone -acetaminophen  (NORCO/VICODIN) 5-325 MG tablet, Take 1 tablet by mouth every 6 (six) hours as needed for moderate pain (pain score 4-6)., Disp: 60 tablet, Rfl: 0   levothyroxine  (SYNTHROID ) 100 MCG tablet, Take 1 tablet by mouth once daily, Disp: 30 tablet, Rfl: 0   nebivolol  (BYSTOLIC ) 10 MG tablet, TAKE 2 TABLETS DAILY, Disp: 180 tablet, Rfl: 1   olmesartan  (BENICAR ) 20 MG tablet, Take 1 tablet (20 mg total) by mouth in the morning and at bedtime., Disp: 180 tablet, Rfl: 3   rosuvastatin  (CRESTOR ) 40 MG tablet, Take 1 tablet (40 mg total) by mouth daily., Disp: 30 tablet, Rfl: 3   Review of Systems Review of Systems  Constitutional:  Positive for chills. Negative for fever.  Respiratory:  Negative for shortness of breath.   Cardiovascular:  Positive for leg swelling. Negative for chest pain and palpitations.  Gastrointestinal:  Negative for abdominal pain, constipation, diarrhea, nausea and vomiting.  Musculoskeletal:  Positive for back pain. Negative for falls, myalgias and neck pain.  Neurological:  Negative for dizziness, weakness and headaches.       Objective:    Vitals BP 128/86   Pulse 68   Temp 98.6 F (37 C) (Oral)   Resp 16   Ht 4' 10 (1.473 m)   Wt 216 lb (98 kg)   SpO2 (!) 85%   BMI 45.14 kg/m    Physical Examination Physical Exam Constitutional:      Appearance: Normal appearance. She is not ill-appearing.  Cardiovascular:     Rate and Rhythm: Normal rate and regular rhythm.     Pulses: Normal pulses.     Heart sounds: No murmur  heard.    No friction rub. No gallop.  Pulmonary:     Effort: Pulmonary effort is normal. No respiratory distress.     Breath sounds: No wheezing, rhonchi or rales.  Abdominal:     General: Bowel sounds are normal. There is no distension.     Palpations: Abdomen is soft.     Tenderness: There is no abdominal tenderness.  Musculoskeletal:     Right lower leg: No edema.     Left lower leg: No edema.  Skin:    General: Skin is  warm and dry.     Findings: No rash.  Neurological:     Mental Status: She is alert.        Assessment & Plan:   Chronic pain syndrome I reviewed her notes from Dr. Mavis.  She has an appointment this coming week for followup with him.  We will refill her pain meds.   Her [facility] CSR/PDMP was reviewed.      No follow-ups on file.   Selinda Fleeta Finger, MD

## 2024-03-11 DIAGNOSIS — R262 Difficulty in walking, not elsewhere classified: Secondary | ICD-10-CM | POA: Diagnosis not present

## 2024-03-11 DIAGNOSIS — M4726 Other spondylosis with radiculopathy, lumbar region: Secondary | ICD-10-CM | POA: Diagnosis not present

## 2024-03-13 DIAGNOSIS — R262 Difficulty in walking, not elsewhere classified: Secondary | ICD-10-CM | POA: Diagnosis not present

## 2024-03-13 DIAGNOSIS — M4726 Other spondylosis with radiculopathy, lumbar region: Secondary | ICD-10-CM | POA: Diagnosis not present

## 2024-03-14 DIAGNOSIS — M4316 Spondylolisthesis, lumbar region: Secondary | ICD-10-CM | POA: Diagnosis not present

## 2024-03-14 DIAGNOSIS — Z6841 Body Mass Index (BMI) 40.0 and over, adult: Secondary | ICD-10-CM | POA: Diagnosis not present

## 2024-03-17 ENCOUNTER — Other Ambulatory Visit: Payer: Self-pay | Admitting: Internal Medicine

## 2024-03-18 DIAGNOSIS — M4726 Other spondylosis with radiculopathy, lumbar region: Secondary | ICD-10-CM | POA: Diagnosis not present

## 2024-03-18 DIAGNOSIS — R262 Difficulty in walking, not elsewhere classified: Secondary | ICD-10-CM | POA: Diagnosis not present

## 2024-03-20 ENCOUNTER — Encounter: Payer: Self-pay | Admitting: Neurosurgery

## 2024-03-20 ENCOUNTER — Other Ambulatory Visit: Payer: Self-pay | Admitting: Neurosurgery

## 2024-03-20 ENCOUNTER — Other Ambulatory Visit: Payer: Self-pay

## 2024-03-20 DIAGNOSIS — M4316 Spondylolisthesis, lumbar region: Secondary | ICD-10-CM

## 2024-03-20 DIAGNOSIS — R262 Difficulty in walking, not elsewhere classified: Secondary | ICD-10-CM | POA: Diagnosis not present

## 2024-03-20 DIAGNOSIS — M4726 Other spondylosis with radiculopathy, lumbar region: Secondary | ICD-10-CM | POA: Diagnosis not present

## 2024-03-20 MED ORDER — ROSUVASTATIN CALCIUM 40 MG PO TABS: 40.0000 mg | ORAL_TABLET | Freq: Every day | ORAL | 3 refills | Status: DC

## 2024-03-20 MED ORDER — NEBIVOLOL HCL 10 MG PO TABS: 20.0000 mg | ORAL_TABLET | Freq: Every day | ORAL | 1 refills | Status: DC

## 2024-03-23 ENCOUNTER — Ambulatory Visit
Admission: RE | Admit: 2024-03-23 | Discharge: 2024-03-23 | Disposition: A | Discharge status: Home / Self Care | Source: Ambulatory Visit | Attending: Neurosurgery | Admitting: Neurosurgery

## 2024-03-23 DIAGNOSIS — M4316 Spondylolisthesis, lumbar region: Secondary | ICD-10-CM | POA: Diagnosis not present

## 2024-03-23 DIAGNOSIS — M48061 Spinal stenosis, lumbar region without neurogenic claudication: Secondary | ICD-10-CM | POA: Diagnosis not present

## 2024-03-25 ENCOUNTER — Encounter: Payer: Self-pay | Admitting: Neurosurgery

## 2024-04-04 ENCOUNTER — Other Ambulatory Visit: Payer: Self-pay | Admitting: Internal Medicine

## 2024-04-04 DIAGNOSIS — M47816 Spondylosis without myelopathy or radiculopathy, lumbar region: Secondary | ICD-10-CM | POA: Diagnosis not present

## 2024-04-04 DIAGNOSIS — M4316 Spondylolisthesis, lumbar region: Secondary | ICD-10-CM | POA: Diagnosis not present

## 2024-04-04 DIAGNOSIS — M48062 Spinal stenosis, lumbar region with neurogenic claudication: Secondary | ICD-10-CM | POA: Diagnosis not present

## 2024-04-18 NOTE — Progress Notes (Signed)
 Surgical preop orders have been requested.

## 2024-04-18 NOTE — Progress Notes (Signed)
 Surgical Instructions   Your procedure is scheduled on Wednesday, October 29th. Report to [facility] Main Entrance A at 11:00 A.M., then check in with the Admitting office. Any questions or running late day of surgery: call [phone]  Questions prior to your surgery date: call [phone], Monday-Friday, 8am-4pm. If you experience any cold or flu symptoms such as cough, fever, chills, shortness of breath, etc. between now and your scheduled surgery, please notify us  at the above number.     Remember:  Do not eat after midnight the night before your surgery  You may drink clear liquids until 10:00 the morning of your surgery.   Clear liquids allowed are: Water, Non-Citrus Juices (without pulp), Carbonated Beverages, Clear Tea (no milk, honey, etc.), Black Coffee Only (NO MILK, CREAM OR POWDERED CREAMER of any kind), and Gatorade.    Take these medicines the morning of surgery with A SIP OF WATER  budesonide  (ENTOCORT EC )  hydrALAZINE  (APRESOLINE )  levothyroxine  (SYNTHROID )  nebivolol  (BYSTOLIC )    May take these medicines IF NEEDED: ALPRAZolam  (XANAX )  diazepam  (VALIUM )  fluticasone  (FLONASE )  HYDROcodone -acetaminophen  (NORCO/VICODIN)    One week prior to surgery, STOP taking any Aspirin (unless otherwise instructed by your surgeon) Aleve, Naproxen, Ibuprofen, Motrin, Advil, Goody's, BC's, all herbal medications, fish oil, and non-prescription vitamins. This includes your celecoxib  (CELEBREX ).                      Do NOT Smoke (Tobacco/Vaping) for 24 hours prior to your procedure.  If you use a CPAP at night, you may bring your mask/headgear for your overnight stay.   You will be asked to remove any contacts, glasses, piercing's, hearing aid's, dentures/partials prior to surgery. Please bring cases for these items if needed.    Patients discharged the day of surgery will not be allowed to drive home, and someone needs to stay with them for 24 hours.  SURGICAL WAITING  ROOM VISITATION Patients may have no more than 2 support people in the waiting area - these visitors may rotate.   Pre-op nurse will coordinate an appropriate time for 1 ADULT support person, who may not rotate, to accompany patient in pre-op.  Children under the age of 27 must have an adult with them who is not the patient and must remain in the main waiting area with an adult.  If the patient needs to stay at the hospital during part of their recovery, the visitor guidelines for inpatient rooms apply.  Please refer to the [facility] website for the visitor guidelines for any additional information.   If you received a COVID test during your pre-op visit  it is requested that you wear a mask when out in public, stay away from anyone that may not be feeling well and notify your surgeon if you develop symptoms. If you have been in contact with anyone that has tested positive in the last 10 days please notify you surgeon.      Pre-operative 4 CHG Bathing Instructions   You can play a key role in reducing the risk of infection after surgery. Your skin needs to be as free of germs as possible. You can reduce the number of germs on your skin by washing with CHG (chlorhexidine gluconate) soap before surgery. CHG is an antiseptic soap that kills germs and continues to kill germs even after washing.   DO NOT use if you have an allergy to chlorhexidine/CHG or antibacterial soaps. If your skin becomes reddened or  irritated, stop using the CHG and notify one of our RNs at [phone].   Please shower with the CHG soap starting 4 days before surgery using the following schedule:     Please keep in mind the following:  DO NOT shave, including legs and underarms, starting the day of your first shower.   You may shave your face at any point before/day of surgery.  Place clean sheets on your bed the day you start using CHG soap. Use a clean washcloth (not used since being washed) for each shower. DO  NOT sleep with pets once you start using the CHG.   CHG Shower Instructions:  Wash your face and private area with normal soap. If you choose to wash your hair, wash first with your normal shampoo.  After you use shampoo/soap, rinse your hair and body thoroughly to remove shampoo/soap residue.  Turn the water OFF and apply  bottle of CHG soap to a CLEAN washcloth.  Apply CHG soap ONLY FROM YOUR NECK DOWN TO YOUR TOES (washing for 3-5 minutes)  DO NOT use CHG soap on face, private areas, open wounds, or sores.  Pay special attention to the area where your surgery is being performed.  If you are having back surgery, having someone wash your back for you may be helpful. Wait 2 minutes after CHG soap is applied, then you may rinse off the CHG soap.  Pat dry with a clean towel  Put on clean clothes/pajamas   If you choose to wear lotion, please use ONLY the CHG-compatible lotions that are listed below.  Additional instructions for the day of surgery:  If you choose, you may shower the morning of surgery with an antibacterial soap.  DO NOT APPLY any lotions, deodorants, powders, or perfumes.   Do not bring valuables to the hospital. [facility] is not responsible for any belongings/valuables. Do not wear nail polish, gel polish, artificial nails, or any other type of covering on natural nails (fingers and toes) Do not wear jewelry or makeup Put on clean/comfortable clothes.  Please brush your teeth.  Ask your nurse before applying any prescription medications to the skin.     CHG Compatible Lotions   Aveeno Moisturizing lotion  Cetaphil Moisturizing Cream  Cetaphil Moisturizing Lotion  Clairol Herbal Essence Moisturizing Lotion, Dry Skin  Clairol Herbal Essence Moisturizing Lotion, Extra Dry Skin  Clairol Herbal Essence Moisturizing Lotion, Normal Skin  Curel Age Defying Therapeutic Moisturizing Lotion with Alpha Hydroxy  Curel Extreme Care Body Lotion  Curel Soothing Hands  Moisturizing Hand Lotion  Curel Therapeutic Moisturizing Cream, Fragrance-Free  Curel Therapeutic Moisturizing Lotion, Fragrance-Free  Curel Therapeutic Moisturizing Lotion, Original Formula  Eucerin Daily Replenishing Lotion  Eucerin Dry Skin Therapy Plus Alpha Hydroxy Crme  Eucerin Dry Skin Therapy Plus Alpha Hydroxy Lotion  Eucerin Original Crme  Eucerin Original Lotion  Eucerin Plus Crme Eucerin Plus Lotion  Eucerin TriLipid Replenishing Lotion  Keri Anti-Bacterial Hand Lotion  Keri Deep Conditioning Original Lotion Dry Skin Formula Softly Scented  Keri Deep Conditioning Original Lotion, Fragrance Free Sensitive Skin Formula  Keri Lotion Fast Absorbing Fragrance Free Sensitive Skin Formula  Keri Lotion Fast Absorbing Softly Scented Dry Skin Formula  Keri Original Lotion  Keri Skin Renewal Lotion Keri Silky Smooth Lotion  Keri Silky Smooth Sensitive Skin Lotion  Nivea Body Creamy Conditioning Oil  Nivea Body Extra Enriched Teacher, adult education Moisturizing Lotion Nivea Crme  Nivea Skin Firming Lotion  NutraDerm 30  Skin Lotion  NutraDerm Skin Lotion  NutraDerm Therapeutic Skin Cream  NutraDerm Therapeutic Skin Lotion  ProShield Protective Hand Cream  Provon moisturizing lotion  Please read over the following fact sheets that you were given.

## 2024-04-20 ENCOUNTER — Other Ambulatory Visit: Payer: Self-pay | Admitting: Internal Medicine

## 2024-04-20 DIAGNOSIS — I10 Essential (primary) hypertension: Secondary | ICD-10-CM

## 2024-04-20 DIAGNOSIS — E7800 Pure hypercholesterolemia, unspecified: Secondary | ICD-10-CM

## 2024-04-21 ENCOUNTER — Encounter (HOSPITAL_COMMUNITY): Payer: Self-pay

## 2024-04-21 ENCOUNTER — Other Ambulatory Visit: Payer: Self-pay

## 2024-04-21 ENCOUNTER — Encounter (HOSPITAL_COMMUNITY)
Admission: RE | Admit: 2024-04-21 | Discharge: 2024-04-21 | Disposition: A | Discharge status: Home / Self Care | Source: Ambulatory Visit | Attending: Neurosurgery | Admitting: Neurosurgery

## 2024-04-21 VITALS — BP 103/67 | HR 65 | Temp 98.4°F | Resp 18 | Ht <= 58 in | Wt 203.0 lb

## 2024-04-21 DIAGNOSIS — Z01818 Encounter for other preprocedural examination: Secondary | ICD-10-CM | POA: Insufficient documentation

## 2024-04-21 LAB — BASIC METABOLIC PANEL WITH GFR
Anion gap: 11 (ref 5–15)
BUN: 15 mg/dL (ref 8–23)
CO2: 29 mmol/L (ref 22–32)
Calcium: 10 mg/dL (ref 8.9–10.3)
Chloride: 98 mmol/L (ref 98–111)
Creatinine, Ser: 0.78 mg/dL (ref 0.44–1.00)
GFR, Estimated: >60 mL/min (ref >60)
Glucose, Bld: 119 mg/dL — ABNORMAL HIGH (ref 70–99)
Potassium: 4.5 mmol/L (ref 3.5–5.1)
Sodium: 138 mmol/L (ref 135–145)

## 2024-04-21 LAB — SURGICAL PCR SCREEN
MRSA, PCR: NEGATIVE
Staphylococcus aureus: POSITIVE — AB

## 2024-04-21 LAB — TYPE AND SCREEN
ABO/RH(D): AB POS
Antibody Screen: NEGATIVE

## 2024-04-21 LAB — CBC
HCT: 42.3 % (ref 36.0–46.0)
Hemoglobin: 13.7 g/dL (ref 12.0–15.0)
MCH: 32.2 pg (ref 26.0–34.0)
MCHC: 32.4 g/dL (ref 30.0–36.0)
MCV: 99.3 fL (ref 80.0–100.0)
Platelets: 210 K/uL (ref 150–400)
RBC: 4.26 MIL/uL (ref 3.87–5.11)
RDW: 12.6 % (ref 11.5–15.5)
WBC: 8.8 K/uL (ref 4.0–10.5)
nRBC: 0 % (ref 0.0–0.2)

## 2024-04-21 NOTE — Progress Notes (Signed)
 PCP - Dr. Selinda Salinas Eyk Cardiologist - Denies  PPM/ICD - denies Device Orders - n/a Rep Notified - n/a  Chest x-ray - n/a EKG - 04-21-24 Stress Test - denies ECHO - denies Cardiac Cath - denies  Sleep Study - denies CPAP - n/a  Denies  Last dose of GLP1 agonist-  denies GLP1 instructions: n/a  Blood Thinner Instructions: Denies Aspirin Instructions:n/a  ERAS Protcol - Clears until 1000 PRE-SURGERY Ensure or G2- n/a  COVID TEST- n/a   Anesthesia review: Yes, HTN, HLD, new ekg  Patient denies shortness of breath, fever, cough and chest pain at PAT appointment   All instructions explained to the patient, with a verbal understanding of the material. Patient agrees to go over the instructions while at home for a better understanding. Patient also instructed to self quarantine after being tested for COVID-19. The opportunity to ask questions was provided.

## 2024-04-23 ENCOUNTER — Ambulatory Visit (HOSPITAL_COMMUNITY)

## 2024-04-23 ENCOUNTER — Ambulatory Visit (HOSPITAL_COMMUNITY)
Admission: RE | Admit: 2024-04-23 | Discharge: 2024-04-24 | Disposition: A | Discharge status: Home / Self Care | Attending: Neurosurgery | Admitting: Neurosurgery

## 2024-04-23 ENCOUNTER — Other Ambulatory Visit: Payer: Self-pay

## 2024-04-23 ENCOUNTER — Encounter (HOSPITAL_COMMUNITY): Payer: Self-pay | Admitting: Neurosurgery

## 2024-04-23 ENCOUNTER — Encounter (HOSPITAL_COMMUNITY): Payer: Self-pay | Admitting: Physician Assistant

## 2024-04-23 ENCOUNTER — Encounter (HOSPITAL_COMMUNITY): Admission: RE | Disposition: A | Payer: Self-pay | Source: Home / Self Care | Attending: Neurosurgery

## 2024-04-23 ENCOUNTER — Ambulatory Visit (HOSPITAL_COMMUNITY): Admitting: Certified Registered Nurse Anesthetist

## 2024-04-23 DIAGNOSIS — F419 Anxiety disorder, unspecified: Secondary | ICD-10-CM | POA: Insufficient documentation

## 2024-04-23 DIAGNOSIS — E039 Hypothyroidism, unspecified: Secondary | ICD-10-CM | POA: Diagnosis not present

## 2024-04-23 DIAGNOSIS — M4316 Spondylolisthesis, lumbar region: Secondary | ICD-10-CM | POA: Diagnosis not present

## 2024-04-23 DIAGNOSIS — I10 Essential (primary) hypertension: Secondary | ICD-10-CM

## 2024-04-23 DIAGNOSIS — M4726 Other spondylosis with radiculopathy, lumbar region: Secondary | ICD-10-CM | POA: Insufficient documentation

## 2024-04-23 DIAGNOSIS — Z79899 Other long term (current) drug therapy: Secondary | ICD-10-CM | POA: Diagnosis not present

## 2024-04-23 DIAGNOSIS — F32A Depression, unspecified: Secondary | ICD-10-CM | POA: Insufficient documentation

## 2024-04-23 DIAGNOSIS — Z8616 Personal history of COVID-19: Secondary | ICD-10-CM | POA: Insufficient documentation

## 2024-04-23 DIAGNOSIS — M5116 Intervertebral disc disorders with radiculopathy, lumbar region: Secondary | ICD-10-CM | POA: Diagnosis not present

## 2024-04-23 DIAGNOSIS — Z87891 Personal history of nicotine dependence: Secondary | ICD-10-CM | POA: Diagnosis not present

## 2024-04-23 DIAGNOSIS — M48062 Spinal stenosis, lumbar region with neurogenic claudication: Secondary | ICD-10-CM | POA: Diagnosis not present

## 2024-04-23 DIAGNOSIS — J449 Chronic obstructive pulmonary disease, unspecified: Secondary | ICD-10-CM

## 2024-04-23 DIAGNOSIS — Z0389 Encounter for observation for other suspected diseases and conditions ruled out: Secondary | ICD-10-CM | POA: Diagnosis not present

## 2024-04-23 LAB — ABO/RH: ABO/RH(D): AB POS

## 2024-04-23 SURGERY — POSTERIOR LUMBAR FUSION 1 LEVEL
Anesthesia: General

## 2024-04-23 MED ORDER — CEFAZOLIN SODIUM 1 G IJ SOLR
INTRAMUSCULAR | Status: AC
  Filled 2024-04-23: qty 20

## 2024-04-23 MED ORDER — ONDANSETRON HCL 4 MG PO TABS: 4.0000 mg | ORAL_TABLET | Freq: Four times a day (QID) | ORAL | Status: DC | PRN

## 2024-04-23 MED ORDER — LEVOTHYROXINE SODIUM 100 MCG PO TABS
100.0000 ug | ORAL_TABLET | Freq: Every day | ORAL | Status: DC
  Administered 2024-04-24: 100 ug via ORAL
  Filled 2024-04-23: qty 1

## 2024-04-23 MED ORDER — LIDOCAINE 2% (20 MG/ML) 5 ML SYRINGE
INTRAMUSCULAR | Status: AC
  Filled 2024-04-23: qty 5

## 2024-04-23 MED ORDER — MUPIROCIN 2 % EX OINT
1.0000 | TOPICAL_OINTMENT | Freq: Once | CUTANEOUS | Status: AC
  Administered 2024-04-23: 1 via TOPICAL
  Filled 2024-04-23: qty 22

## 2024-04-23 MED ORDER — SODIUM CHLORIDE 0.9% FLUSH
3.0000 mL | INTRAVENOUS | Status: DC | PRN
Start: 2024-04-23 — End: 2024-04-24

## 2024-04-23 MED ORDER — ONDANSETRON HCL 4 MG/2ML IJ SOLN
INTRAMUSCULAR | Status: AC
Start: 2024-04-23 — End: 2024-04-23
  Filled 2024-04-23: qty 2

## 2024-04-23 MED ORDER — OXYCODONE HCL 5 MG/5ML PO SOLN: 5.0000 mg | Freq: Once | ORAL | Status: DC | PRN

## 2024-04-23 MED ORDER — ROSUVASTATIN CALCIUM 20 MG PO TABS
40.0000 mg | ORAL_TABLET | Freq: Every day | ORAL | Status: DC
Start: 2024-04-23 — End: 2024-04-24
  Administered 2024-04-23: 40 mg via ORAL
  Filled 2024-04-23 (×2): qty 2

## 2024-04-23 MED ORDER — BUPIVACAINE HCL (PF) 0.5 % IJ SOLN
INTRAMUSCULAR | Status: AC
Start: 2024-04-23 — End: 2024-04-23
  Filled 2024-04-23: qty 30

## 2024-04-23 MED ORDER — PHENYLEPHRINE HCL-NACL 20-0.9 MG/250ML-% IV SOLN
INTRAVENOUS | Status: DC | PRN
Start: 2024-04-23 — End: 2024-04-23
  Administered 2024-04-23: 30 ug/min via INTRAVENOUS

## 2024-04-23 MED ORDER — BISACODYL 10 MG RE SUPP: 10.0000 mg | Freq: Every day | RECTAL | Status: DC | PRN

## 2024-04-23 MED ORDER — BACITRACIN ZINC 500 UNIT/GM EX OINT
TOPICAL_OINTMENT | CUTANEOUS | Status: DC | PRN
Start: 2024-04-23 — End: 2024-04-23
  Administered 2024-04-23: 1 via TOPICAL

## 2024-04-23 MED ORDER — MENTHOL 3 MG MT LOZG: 1.0000 | LOZENGE | OROMUCOSAL | Status: DC | PRN

## 2024-04-23 MED ORDER — ACETAMINOPHEN 500 MG PO TABS
1000.0000 mg | ORAL_TABLET | Freq: Once | ORAL | Status: AC
  Administered 2024-04-23: 1000 mg via ORAL
  Filled 2024-04-23: qty 2

## 2024-04-23 MED ORDER — BUPIVACAINE-EPINEPHRINE (PF) 0.5% -1:200000 IJ SOLN
INTRAMUSCULAR | Status: AC
  Filled 2024-04-23: qty 30

## 2024-04-23 MED ORDER — MORPHINE SULFATE (PF) 2 MG/ML IV SOLN: 2.0000 mg | INTRAVENOUS | Status: DC | PRN

## 2024-04-23 MED ORDER — 0.9 % SODIUM CHLORIDE (POUR BTL) OPTIME
TOPICAL | Status: DC | PRN
  Administered 2024-04-23: 1000 mL

## 2024-04-23 MED ORDER — EPHEDRINE 5 MG/ML INJ
INTRAVENOUS | Status: AC
Start: 2024-04-23 — End: 2024-04-23
  Filled 2024-04-23: qty 5

## 2024-04-23 MED ORDER — MIDAZOLAM HCL (PF) 2 MG/2ML IJ SOLN
INTRAMUSCULAR | Status: DC | PRN
  Administered 2024-04-23 (×2): 1 mg via INTRAVENOUS

## 2024-04-23 MED ORDER — ACETAMINOPHEN 650 MG RE SUPP: 650.0000 mg | RECTAL | Status: DC | PRN

## 2024-04-23 MED ORDER — HYDROCHLOROTHIAZIDE 25 MG PO TABS
25.0000 mg | ORAL_TABLET | Freq: Every day | ORAL | Status: DC
  Administered 2024-04-24: 25 mg via ORAL
  Filled 2024-04-23: qty 1

## 2024-04-23 MED ORDER — PHENYLEPHRINE 80 MCG/ML (10ML) SYRINGE FOR IV PUSH (FOR BLOOD PRESSURE SUPPORT)
PREFILLED_SYRINGE | INTRAVENOUS | Status: AC
Start: 2024-04-23 — End: 2024-04-23
  Filled 2024-04-23: qty 10

## 2024-04-23 MED ORDER — ORAL CARE MOUTH RINSE: 15.0000 mL | Freq: Once | OROMUCOSAL | Status: AC

## 2024-04-23 MED ORDER — ACETAMINOPHEN 10 MG/ML IV SOLN: 1000.0000 mg | Freq: Once | INTRAVENOUS | Status: DC | PRN

## 2024-04-23 MED ORDER — HYDROMORPHONE HCL 1 MG/ML IJ SOLN: 0.5000 mg | INTRAMUSCULAR | Status: DC | PRN

## 2024-04-23 MED ORDER — KETAMINE HCL 50 MG/5ML IJ SOSY
PREFILLED_SYRINGE | INTRAMUSCULAR | Status: AC
  Filled 2024-04-23: qty 5

## 2024-04-23 MED ORDER — BUPIVACAINE HCL 0.5 % IJ SOLN
INTRAMUSCULAR | Status: DC | PRN
  Administered 2024-04-23: 10 mL

## 2024-04-23 MED ORDER — VASHE WOUND IRRIGATION OPTIME
TOPICAL | Status: DC | PRN
  Administered 2024-04-23: 34 [oz_av] via TOPICAL

## 2024-04-23 MED ORDER — DROPERIDOL 2.5 MG/ML IJ SOLN: 0.6250 mg | Freq: Once | INTRAMUSCULAR | Status: DC | PRN

## 2024-04-23 MED ORDER — NEBIVOLOL HCL 10 MG PO TABS
20.0000 mg | ORAL_TABLET | Freq: Every day | ORAL | Status: DC
  Administered 2024-04-23: 20 mg via ORAL
  Filled 2024-04-23 (×2): qty 2

## 2024-04-23 MED ORDER — SODIUM CHLORIDE 0.9 % IV SOLN
250.0000 mL | INTRAVENOUS | Status: DC
  Administered 2024-04-23: 250 mL via INTRAVENOUS

## 2024-04-23 MED ORDER — IRBESARTAN 150 MG PO TABS
150.0000 mg | ORAL_TABLET | Freq: Every day | ORAL | Status: DC
  Administered 2024-04-23 – 2024-04-24 (×2): 150 mg via ORAL
  Filled 2024-04-23 (×2): qty 1

## 2024-04-23 MED ORDER — THROMBIN 5000 UNITS EX SOLR
OROMUCOSAL | Status: DC | PRN
  Administered 2024-04-23: 5 mL via TOPICAL

## 2024-04-23 MED ORDER — FENTANYL CITRATE (PF) 100 MCG/2ML IJ SOLN: 25.0000 ug | INTRAMUSCULAR | Status: DC | PRN

## 2024-04-23 MED ORDER — PROPOFOL 10 MG/ML IV BOLUS
INTRAVENOUS | Status: AC
Start: 2024-04-23 — End: 2024-04-23
  Filled 2024-04-23: qty 20

## 2024-04-23 MED ORDER — FENTANYL CITRATE (PF) 100 MCG/2ML IJ SOLN
INTRAMUSCULAR | Status: AC
  Filled 2024-04-23: qty 2

## 2024-04-23 MED ORDER — ACETAMINOPHEN 325 MG PO TABS: 650.0000 mg | ORAL_TABLET | ORAL | Status: DC | PRN

## 2024-04-23 MED ORDER — ZOLPIDEM TARTRATE 5 MG PO TABS: 5.0000 mg | ORAL_TABLET | Freq: Every evening | ORAL | Status: DC | PRN

## 2024-04-23 MED ORDER — OXYCODONE HCL 5 MG PO TABS: 5.0000 mg | ORAL_TABLET | ORAL | Status: DC | PRN

## 2024-04-23 MED ORDER — THROMBIN 5000 UNITS EX KIT
PACK | CUTANEOUS | Status: AC
  Filled 2024-04-23: qty 1

## 2024-04-23 MED ORDER — BUPIVACAINE LIPOSOME 1.3 % IJ SUSP
INTRAMUSCULAR | Status: DC | PRN
  Administered 2024-04-23: 10 mL

## 2024-04-23 MED ORDER — CEFAZOLIN SODIUM-DEXTROSE 2-3 GM-%(50ML) IV SOLR
INTRAVENOUS | Status: DC | PRN
  Administered 2024-04-23: 2 g via INTRAVENOUS

## 2024-04-23 MED ORDER — DEXAMETHASONE SOD PHOSPHATE PF 10 MG/ML IJ SOLN
INTRAMUSCULAR | Status: DC | PRN
  Administered 2024-04-23: 10 mg via INTRAVENOUS

## 2024-04-23 MED ORDER — ROCURONIUM BROMIDE 10 MG/ML (PF) SYRINGE
PREFILLED_SYRINGE | INTRAVENOUS | Status: AC
Start: 2024-04-23 — End: 2024-04-23
  Filled 2024-04-23: qty 10

## 2024-04-23 MED ORDER — BUDESONIDE 3 MG PO CPEP
9.0000 mg | ORAL_CAPSULE | Freq: Every day | ORAL | Status: DC
Start: 2024-04-24 — End: 2024-04-24
  Administered 2024-04-24: 9 mg via ORAL
  Filled 2024-04-23: qty 3

## 2024-04-23 MED ORDER — OXYCODONE HCL 5 MG PO TABS
10.0000 mg | ORAL_TABLET | ORAL | Status: DC | PRN
  Administered 2024-04-23 – 2024-04-24 (×3): 10 mg via ORAL
  Filled 2024-04-23 (×3): qty 2

## 2024-04-23 MED ORDER — FLUTICASONE PROPIONATE 50 MCG/ACT NA SUSP: 1.0000 | Freq: Every day | NASAL | Status: DC | PRN

## 2024-04-23 MED ORDER — ONDANSETRON HCL 4 MG/2ML IJ SOLN: 4.0000 mg | Freq: Four times a day (QID) | INTRAMUSCULAR | Status: DC | PRN

## 2024-04-23 MED ORDER — SODIUM CHLORIDE 0.9% FLUSH: 3.0000 mL | Freq: Two times a day (BID) | INTRAVENOUS | Status: DC

## 2024-04-23 MED ORDER — ACETAMINOPHEN 500 MG PO TABS
1000.0000 mg | ORAL_TABLET | Freq: Four times a day (QID) | ORAL | Status: DC
  Administered 2024-04-23 – 2024-04-24 (×3): 1000 mg via ORAL
  Filled 2024-04-23 (×3): qty 2

## 2024-04-23 MED ORDER — ALPRAZOLAM 0.5 MG PO TABS: 0.5000 mg | ORAL_TABLET | Freq: Three times a day (TID) | ORAL | Status: DC | PRN

## 2024-04-23 MED ORDER — LIDOCAINE 2% (20 MG/ML) 5 ML SYRINGE
INTRAMUSCULAR | Status: DC | PRN
  Administered 2024-04-23: 100 mg via INTRAVENOUS

## 2024-04-23 MED ORDER — DOCUSATE SODIUM 100 MG PO CAPS
100.0000 mg | ORAL_CAPSULE | Freq: Two times a day (BID) | ORAL | Status: DC
  Administered 2024-04-23 – 2024-04-24 (×2): 100 mg via ORAL
  Filled 2024-04-23 (×2): qty 1

## 2024-04-23 MED ORDER — MIDAZOLAM HCL 2 MG/2ML IJ SOLN
INTRAMUSCULAR | Status: AC
  Filled 2024-04-23: qty 2

## 2024-04-23 MED ORDER — SUGAMMADEX SODIUM 200 MG/2ML IV SOLN
INTRAVENOUS | Status: DC | PRN
  Administered 2024-04-23: 200 mg via INTRAVENOUS

## 2024-04-23 MED ORDER — LACTATED RINGERS IV SOLN: INTRAVENOUS | Status: DC

## 2024-04-23 MED ORDER — ALBUMIN HUMAN 5 % IV SOLN: INTRAVENOUS | Status: DC | PRN

## 2024-04-23 MED ORDER — CHLORHEXIDINE GLUCONATE 0.12 % MT SOLN
15.0000 mL | Freq: Once | OROMUCOSAL | Status: AC
  Administered 2024-04-23: 15 mL via OROMUCOSAL
  Filled 2024-04-23: qty 15

## 2024-04-23 MED ORDER — BUPIVACAINE LIPOSOME 1.3 % IJ SUSP
INTRAMUSCULAR | Status: AC
  Filled 2024-04-23: qty 20

## 2024-04-23 MED ORDER — OXYCODONE HCL 5 MG PO TABS: 5.0000 mg | ORAL_TABLET | Freq: Once | ORAL | Status: DC | PRN

## 2024-04-23 MED ORDER — BACITRACIN ZINC 500 UNIT/GM EX OINT
TOPICAL_OINTMENT | CUTANEOUS | Status: AC
  Filled 2024-04-23: qty 28.35

## 2024-04-23 MED ORDER — CEFAZOLIN SODIUM-DEXTROSE 2-4 GM/100ML-% IV SOLN
2.0000 g | Freq: Three times a day (TID) | INTRAVENOUS | Status: AC
  Administered 2024-04-23 – 2024-04-24 (×2): 2 g via INTRAVENOUS
  Filled 2024-04-23 (×2): qty 100

## 2024-04-23 MED ORDER — PHENOL 1.4 % MT LIQD: 1.0000 | OROMUCOSAL | Status: DC | PRN

## 2024-04-23 MED ORDER — EPHEDRINE SULFATE-NACL 50-0.9 MG/10ML-% IV SOSY
PREFILLED_SYRINGE | INTRAVENOUS | Status: DC | PRN
  Administered 2024-04-23: 10 mg via INTRAVENOUS
  Administered 2024-04-23 (×3): 5 mg via INTRAVENOUS

## 2024-04-23 MED ORDER — FENTANYL CITRATE (PF) 250 MCG/5ML IJ SOLN
INTRAMUSCULAR | Status: DC | PRN
  Administered 2024-04-23 (×2): 100 ug via INTRAVENOUS

## 2024-04-23 MED ORDER — PROPOFOL 10 MG/ML IV BOLUS
INTRAVENOUS | Status: DC | PRN
  Administered 2024-04-23: 140 mg via INTRAVENOUS

## 2024-04-23 MED ORDER — ONDANSETRON HCL 4 MG/2ML IJ SOLN
INTRAMUSCULAR | Status: DC | PRN
  Administered 2024-04-23: 4 mg via INTRAVENOUS

## 2024-04-23 MED ORDER — ROCURONIUM BROMIDE 10 MG/ML (PF) SYRINGE
PREFILLED_SYRINGE | INTRAVENOUS | Status: DC | PRN
  Administered 2024-04-23: 60 mg via INTRAVENOUS
  Administered 2024-04-23: 40 mg via INTRAVENOUS
  Administered 2024-04-23: 60 mg via INTRAVENOUS

## 2024-04-23 MED ORDER — CYCLOBENZAPRINE HCL 10 MG PO TABS
10.0000 mg | ORAL_TABLET | Freq: Three times a day (TID) | ORAL | Status: DC | PRN
  Administered 2024-04-23: 10 mg via ORAL
  Filled 2024-04-23: qty 1

## 2024-04-23 SURGICAL SUPPLY — 61 items
BAG COUNTER SPONGE SURGICOUNT (BAG) ×1 IMPLANT
BASKET BONE COLLECTION (BASKET) ×1 IMPLANT
BENZOIN TINCTURE PRP APPL 2/3 (GAUZE/BANDAGES/DRESSINGS) ×1 IMPLANT
BLADE CLIPPER SURG (BLADE) IMPLANT
BUR MATCHSTICK NEURO 3.0 LAGG (BURR) ×1 IMPLANT
BUR PRECISION FLUTE 6.0 (BURR) ×1 IMPLANT
CAGE ALTERA 10X31X9-13 15D (Cage) IMPLANT
CANISTER SUCTION 3000ML PPV (SUCTIONS) ×1 IMPLANT
CAP LOCK DLX THRD (Cap) IMPLANT
CHLORAPREP W/TINT 26 (MISCELLANEOUS) IMPLANT
CLEANSER WND VASHE INSTL 34OZ (WOUND CARE) ×1 IMPLANT
CNTNR URN SCR LID CUP LEK RST (MISCELLANEOUS) ×1 IMPLANT
COVER BACK TABLE 60X90IN (DRAPES) ×1 IMPLANT
DRAPE C-ARM 42X72 X-RAY (DRAPES) ×2 IMPLANT
DRAPE HALF SHEET 40X57 (DRAPES) ×1 IMPLANT
DRAPE LAPAROTOMY 100X72X124 (DRAPES) ×1 IMPLANT
DRAPE SURG 17X23 STRL (DRAPES) ×1 IMPLANT
DRSG OPSITE POSTOP 4X12 (GAUZE/BANDAGES/DRESSINGS) IMPLANT
DRSG OPSITE POSTOP 4X6 (GAUZE/BANDAGES/DRESSINGS) ×1 IMPLANT
ELECTRODE BLDE 4.0 EZ CLN MEGD (MISCELLANEOUS) ×1 IMPLANT
ELECTRODE REM PT RTRN 9FT ADLT (ELECTROSURGICAL) ×1 IMPLANT
EVACUATOR 1/8 PVC DRAIN (DRAIN) ×1 IMPLANT
GAUZE 4X4 16PLY ~~LOC~~+RFID DBL (SPONGE) ×1 IMPLANT
GLOVE BIO SURGEON STRL SZ 6 (GLOVE) ×1 IMPLANT
GLOVE BIO SURGEON STRL SZ8 (GLOVE) ×2 IMPLANT
GLOVE BIO SURGEON STRL SZ8.5 (GLOVE) ×2 IMPLANT
GLOVE BIOGEL PI IND STRL 6.5 (GLOVE) ×1 IMPLANT
GLOVE BIOGEL PI IND STRL 7.5 (GLOVE) IMPLANT
GOWN STRL REUS W/ TWL LRG LVL3 (GOWN DISPOSABLE) ×1 IMPLANT
GOWN STRL REUS W/ TWL XL LVL3 (GOWN DISPOSABLE) ×2 IMPLANT
GOWN STRL REUS W/TWL 2XL LVL3 (GOWN DISPOSABLE) IMPLANT
HEMOSTAT POWDER KIT SURGIFOAM (HEMOSTASIS) ×1 IMPLANT
KIT BASIN OR (CUSTOM PROCEDURE TRAY) ×1 IMPLANT
KIT GRAFTMAG DEL NEURO DISP (NEUROSURGERY SUPPLIES) IMPLANT
KIT POSITIONER JACKSON TABLE (MISCELLANEOUS) ×1 IMPLANT
KIT TURNOVER KIT B (KITS) ×1 IMPLANT
NDL HYPO 21X1.5 SAFETY (NEEDLE) ×1 IMPLANT
NDL HYPO 22X1.5 SAFETY MO (MISCELLANEOUS) ×1 IMPLANT
NEEDLE HYPO 21X1.5 SAFETY (NEEDLE) ×1 IMPLANT
NEEDLE HYPO 22X1.5 SAFETY MO (MISCELLANEOUS) ×1 IMPLANT
PACK LAMINECTOMY NEURO (CUSTOM PROCEDURE TRAY) ×1 IMPLANT
PAD ARMBOARD POSITIONER FOAM (MISCELLANEOUS) ×3 IMPLANT
PATTIES SURGICAL .5 X1 (DISPOSABLE) IMPLANT
PUTTY DBM 10CC CALC GRAN (Putty) IMPLANT
ROD CURVED TI 6.35X45 (Rod) IMPLANT
SCREW PA DLX CREO 7.5X45 (Screw) IMPLANT
SCREW PA DLX CREO 7.5X50 (Screw) IMPLANT
SOLN 0.9% NACL POUR BTL 1000ML (IV SOLUTION) ×1 IMPLANT
SOLN STERILE WATER BTL 1000 ML (IV SOLUTION) ×1 IMPLANT
SPIKE FLUID TRANSFER (MISCELLANEOUS) ×1 IMPLANT
SPONGE NEURO XRAY DETECT 1X3 (DISPOSABLE) IMPLANT
SPONGE SURGIFOAM ABS GEL 100 (HEMOSTASIS) IMPLANT
SPONGE T-LAP 4X18 ~~LOC~~+RFID (SPONGE) IMPLANT
STRIP CLOSURE SKIN 1/2X4 (GAUZE/BANDAGES/DRESSINGS) ×1 IMPLANT
SUT VIC AB 1 CT1 18XBRD ANBCTR (SUTURE) ×2 IMPLANT
SUT VIC AB 2-0 CP2 18 (SUTURE) ×2 IMPLANT
SYR 20ML LL LF (SYRINGE) IMPLANT
TOWEL GREEN STERILE (TOWEL DISPOSABLE) ×1 IMPLANT
TOWEL GREEN STERILE FF (TOWEL DISPOSABLE) ×1 IMPLANT
TRAY FOL W/BAG SLVR 16FR STRL (SET/KITS/TRAYS/PACK) IMPLANT
TRAY FOLEY MTR SLVR 16FR STAT (SET/KITS/TRAYS/PACK) ×1 IMPLANT

## 2024-04-23 NOTE — Progress Notes (Signed)
 Orthopedic Tech Progress Note Patient Details:  Jenny Yates [birth year] [id number]  Ortho Devices Type of Ortho Device: Lumbar corsett Ortho Device/Splint Interventions: Ordered, Application, Adjustment   Post Interventions Patient Tolerated: Well Instructions Provided: Care of device, Adjustment of device  [name] 04/23/2024, 6:57 PM

## 2024-04-23 NOTE — Op Note (Signed)
 Brief history: The patient is a 63 year old white female whose had a previous lumbar discectomy many years ago.  She has developed recurrent back and bilateral leg pain consistent with neurogenic claudication.  She failed medical management and was worked up with lumbar x-rays and lumbar MRI which demonstrated L4-5 spondylolisthesis with severe stenosis.  I discussed the various treatment options with her.  She has decided proceed with surgery.  Preoperative diagnosis: Lumbar spondylolisthesis, lumbar facet arthropathy, lumbar degenerative disc disease, spinal stenosis compressing both the L4 and the L5 nerve roots; lumbago; lumbar radiculopathy; neurogenic claudication  Postoperative diagnosis: The same  Procedure: Bilateral L4-5 laminotomy/foraminotomies/medial facetectomy to decompress the bilateral L4 and L5 nerve roots(the work required to do this was in addition to the work required to do the posterior lumbar interbody fusion because of the patient's spinal stenosis, facet arthropathy. Etc. requiring a wide decompression of the nerve roots.); right L4-5 transforaminal lumbar interbody fusion with local morselized autograft bone and Zimmer DBM; insertion of interbody prosthesis at L4-5 (globus peek expandable interbody prosthesis); posterior nonsegmental instrumentation from L4 to L5 with globus titanium pedicle screws and rods; posterior lateral arthrodesis at L4-5 with local morselized autograft bone and Zimmer DBM.  Surgeon: Dr. Chyrl Budge  Asst.: Duwaine Beck, NP  Anesthesia: Gen. endotracheal  Estimated blood loss: 200 cc  Drains: Medium Hemovac drain in the epidural space  Complications: None  Description of procedure: The patient was brought to the operating room by the anesthesia team. General endotracheal anesthesia was induced. The patient was turned to the prone position on the Wilson frame. The patient's lumbosacral region was then prepared with Betadine scrub and Betadine  solution. Sterile drapes were applied.  I then injected the area to be incised with Marcaine with epinephrine solution. I then used the scalpel to make a linear midline incision over the L4-5 interspace. I then used electrocautery to perform a bilateral subperiosteal dissection exposing the spinous process and lamina of L4-5. We then obtained intraoperative radiograph to confirm our location. We then inserted the Verstrac retractor to provide exposure.  I began the decompression by using the high speed drill to perform laminotomies at L4-5 bilaterally. We then used the Kerrison punches to widen the laminotomy and removed the ligamentum flavum at L4-5 bilaterally. We used the Kerrison punches to remove the medial facets at L4-5 bilaterally, we removed the right L4-5 facet. We performed wide foraminotomies about the bilateral L4 and L5 nerve roots completing the decompression.  We now turned our attention to the posterior lumbar interbody fusion. I used a scalpel to incise the intervertebral disc at L4-5 bilaterally. I then performed a partial intervertebral discectomy at L4-5 bilaterally using the pituitary forceps. We prepared the vertebral endplates at L4-5 bilaterally for the fusion by removing the soft tissues with the curettes. We then used the trial spacers to pick the appropriate sized interbody prosthesis. We prefilled his prosthesis with a combination of local morselized autograft bone that we obtained during the decompression as well as Zimmer DBM. We inserted the prefilled prosthesis into the interspace at L4-5 from the right, we then turned and expanded the prosthesis. There was a good snug fit of the prosthesis in the interspace. We then filled and the remainder of the intervertebral disc space with local morselized autograft bone and Zimmer DBM. This completed the posterior lumbar interbody arthrodesis.  During the decompression and insertion of the prosthesis the assistant protected the thecal  sac and nerve roots with the D'Errico retractor.  We now  turned attention to the instrumentation. Under fluoroscopic guidance we cannulated the bilateral L4 and L5 pedicles with the bone probe. We then removed the bone probe. We then tapped the pedicle with a 6.5 millimeter tap. We then removed the tap. We probed inside the tapped pedicle with a ball probe to rule out cortical breaches. We then inserted a 7.5 x 45 and 50 millimeter pedicle screw into the L4 and L5 pedicles bilaterally under fluoroscopic guidance. We then palpated along the medial aspect of the pedicles to rule out cortical breaches. There were none. The nerve roots were not injured. We then connected the unilateral pedicle screws with a lordotic rod. We compressed the construct and secured the rod in place with the caps. We then tightened the caps appropriately. This completed the instrumentation from L4-5.  We now turned our attention to the posterior lateral arthrodesis at L4-5. We used the high-speed drill to decorticate the remainder of the facets, pars, transverse process at L4-5. We then applied a combination of local morselized autograft bone and Zimmer DBM over these decorticated posterior lateral structures. This completed the posterior lateral arthrodesis.  We then obtained hemostasis using bipolar electrocautery. We irrigated the wound out with vashe solution. We inspected the thecal sac and nerve roots and noted they were well decompressed. We then removed the retractor.  We injected Exparel .  We placed a medium Hemovac drain in the epidural space and tunneled it out through a separate stab wound.  We reapproximated patient's thoracolumbar fascia with interrupted #1 Vicryl suture. We reapproximated patient's subcutaneous tissue with interrupted 2-0 Vicryl suture. The reapproximated patient's skin with Steri-Strips and benzoin. The wound was then coated with bacitracin ointment. A sterile dressing was applied. The drapes were  removed. The patient was subsequently returned to the supine position where they were extubated by the anesthesia team. He was then transported to the post anesthesia care unit in stable condition. All sponge instrument and needle counts were reportedly correct at the end of this case.

## 2024-04-23 NOTE — Transfer of Care (Signed)
 Immediate Anesthesia Transfer of Care Note  Patient: OSHA RANE  Procedure(s) Performed: POSTERIOR LUMBAR FUSION LUMBAR FOUR-FIVE  Patient Location: PACU  Anesthesia Type:General  Level of Consciousness: awake, alert , and oriented  Airway & Oxygen Therapy: Patient Spontanous Breathing and Patient connected to nasal cannula oxygen  Post-op Assessment: Report given to RN and Post -op Vital signs reviewed and stable  Post vital signs: Reviewed and stable  Last Vitals:  Vitals Value Taken Time  BP 97/57 04/23/24 17:17  Temp    Pulse 73 04/23/24 17:20  Resp 21 04/23/24 17:20  SpO2 94 % 04/23/24 17:20  Vitals shown include unfiled device data.  Last Pain:  Vitals:   04/23/24 1138  TempSrc:   PainSc: 0-No pain         Complications: No notable events documented.

## 2024-04-23 NOTE — H&P (Signed)
 Subjective: The patient is a 63 year old obese white female on whom I performed an L5-S1 discectomy in 2008.  The patient has done well until about 3 to 4 years ago when she developed progressively worsening back and bilateral leg pain consistent with neurogenic claudication.  She failed medical management.  She was worked up with lumbar x-rays and lumbar MRI which demonstrated an L4-5 spondylolisthesis and spinal stenosis.  I discussed the various treatment options with the patient.  She has decided proceed with surgery.  Past Medical History:  Diagnosis Date   Anemia    Anxiety    Collagenous colitis    Depression    Hyperlipidemia    Hypertension    Hypothyroidism    Lumbar herniated disc    Panic disorder     Past Surgical History:  Procedure Laterality Date   BACK SURGERY     2007   HERNIA REPAIR     TUBAL LIGATION      Allergies  Allergen Reactions   Codeine Other (See Comments)    Syncope knocks me to the floor   Epinephrine Other (See Comments)    Oral seizure-like activity   Azithromycin Rash   Iodine Swelling and Rash    Social History   Tobacco Use   Smoking status: Former    Types: Cigarettes   Smokeless tobacco: Never  Substance Use Topics   Alcohol use: Yes    History reviewed. No pertinent family history. Prior to Admission medications   Medication Sig Start Date End Date Taking? Authorizing Provider  ALPRAZolam  (XANAX ) 0.5 MG tablet Take 1 tablet (0.5 mg total) by mouth 3 (three) times daily as needed. 12/25/23  Yes Fleeta Valeria Mayo, MD  budesonide  (ENTOCORT EC ) 3 MG 24 hr capsule TAKE 3 CAPSULES BY MOUTH EVERY DAY 08/13/23  Yes Fleeta Valeria Mayo, MD  celecoxib  (CELEBREX ) 200 MG capsule Take 1 capsule by mouth once daily 04/21/24  Yes Van Eyk, Jason, MD  Cholecalciferol (VITAMIN D3 PO) Take 2,000 Units by mouth at bedtime.   Yes [provider]  cyanocobalamin (VITAMIN B12) 1000 MCG tablet Take 1,000 mcg by mouth in the morning.   Yes [provider]  diazepam  (VALIUM ) 5 MG tablet Take 1 tablet (5 mg total) by mouth 2 (two) times daily as needed. 03/10/24  Yes Fleeta Valeria Mayo, MD  fluticasone  (FLONASE ) 50 MCG/ACT nasal spray Place 1 spray into both nostrils daily. Patient taking differently: Place 1 spray into both nostrils daily as needed for allergies. 02/04/24 02/03/25 Yes Fleeta Valeria Mayo, MD  hydrALAZINE  (APRESOLINE ) 50 MG tablet Take 1 tablet (50 mg total) by mouth in the morning and at bedtime. TAKE 1 TABLET 2 TIMES DAILYWITH FOOD 02/04/24  Yes Fleeta Valeria, Mayo, MD  hydrochlorothiazide  (HYDRODIURIL ) 25 MG tablet TAKE 1 TABLET DAILY 06/25/23  Yes Fleeta Valeria Mayo, MD  HYDROcodone -acetaminophen  (NORCO/VICODIN) 5-325 MG tablet Take 1 tablet by mouth every 6 (six) hours as needed for moderate pain (pain score 4-6). 03/10/24  Yes Fleeta Valeria Mayo, MD  levothyroxine  (SYNTHROID ) 100 MCG tablet Take 1 tablet by mouth once daily 04/04/24  Yes Fleeta Valeria Mayo, MD  Multiple Vitamin (MULTIVITAMIN WITH MINERALS) TABS tablet Take 1 tablet by mouth in the morning.   Yes [provider]  nebivolol  (BYSTOLIC ) 10 MG tablet Take 2 tablets (20 mg total) by mouth daily. Patient taking differently: Take 20 mg by mouth at bedtime. 03/20/24  Yes Fleeta Valeria Mayo, MD  olmesartan  (BENICAR ) 20 MG tablet Take 1  tablet (20 mg total) by mouth in the morning and at bedtime. 08/24/23  Yes Fleeta Valeria Mayo, MD  rosuvastatin  (CRESTOR ) 40 MG tablet Take 1 tablet (40 mg total) by mouth daily. Patient taking differently: Take 40 mg by mouth at bedtime. 03/20/24  Yes Fleeta Valeria Mayo, MD     Review of Systems  Positive ROS: As above  All other systems have been reviewed and were otherwise negative with the exception of those mentioned in the HPI and as above.  Objective: Vital signs in last 24 hours: Temp:  [98.1 F (36.7 C)] 98.1 F (36.7 C) (10/29 1111) Pulse Rate:  [67] 67 (10/29 1111) Resp:  [18] 18 (10/29 1111) BP: (122)/(63) 122/63 (10/29 1111) SpO2:  [93  %] 93 % (10/29 1111) Weight:  [89.5 kg] 89.5 kg (10/29 1111) Estimated body mass index is 41.26 kg/m as calculated from the following:   Height as of this encounter: 4' 10 (1.473 m).   Weight as of this encounter: 89.5 kg.   General Appearance: Alert, obese Head: Normocephalic, without obvious abnormality, atraumatic Eyes: PERRL, conjunctiva/corneas clear, EOM's intact,    Ears: Normal  Throat: Normal  Neck: Supple, Back: Her lumbar incision is well-healed.  Lungs: Clear to auscultation bilaterally, respirations unlabored Heart: Regular rate and rhythm, no murmur, rub or gallop Abdomen: Soft, non-tender Extremities: Extremities normal, atraumatic, no cyanosis or edema Skin: unremarkable  NEUROLOGIC:   Mental status: alert and oriented,Motor Exam - grossly normal Sensory Exam - grossly normal Reflexes:  Coordination - grossly normal Gait - grossly normal Balance - grossly normal Cranial Nerves: I: smell Not tested  II: visual acuity  OS: Normal  OD: Normal   II: visual fields Full to confrontation  II: pupils Equal, round, reactive to light  III,VII: ptosis None  III,IV,VI: extraocular muscles  Full ROM  V: mastication Normal  V: facial light touch sensation  Normal  V,VII: corneal reflex  Present  VII: facial muscle function - upper  Normal  VII: facial muscle function - lower Normal  VIII: hearing Not tested  IX: soft palate elevation  Normal  IX,X: gag reflex Present  XI: trapezius strength  5/5  XI: sternocleidomastoid strength 5/5  XI: neck flexion strength  5/5  XII: tongue strength  Normal    Data Review Lab Results  Component Value Date   WBC 8.8 04/21/2024   HGB 13.7 04/21/2024   HCT 42.3 04/21/2024   MCV 99.3 04/21/2024   PLT 210 04/21/2024   Lab Results  Component Value Date   NA 138 04/21/2024   K 4.5 04/21/2024   CL 98 04/21/2024   CO2 29 04/21/2024   BUN 15 04/21/2024   CREATININE 0.78 04/21/2024   GLUCOSE 119 (H) 04/21/2024   No  results found for: INR, PROTIME  Assessment/Plan: Lumbar spondylolisthesis, lumbar facet arthropathy, lumbar spinal stenosis, neurogenic claudication, lumbago, lumbar radiculopathy: I have discussed the situation with the patient and her husband.  I reviewed her lumbar x-rays and MRI with him and pointed out the abnormalities.  We have discussed the various treatment options including surgery.  I have described the surgical treatment of an L4-5 decompression, instrumentation and fusion.  I have shown her surgical models.  I have given her surgical pamphlet.  We have discussed the risk, benefits, alternatives, expected postop course, and likelihood of achieving our goals with surgery.  I have answered all her questions.  She has decided proceed with surgery.   Jenny Yates Budge 04/23/2024 1:04 PM

## 2024-04-23 NOTE — Anesthesia Procedure Notes (Signed)
 Procedure Name: Intubation Date/Time: 04/23/2024 1:30 PM  Performed by: Jolynn Mage, CRNAPre-anesthesia Checklist: Patient identified, Patient being monitored, Timeout performed, Emergency Drugs available and Suction available Patient Re-evaluated:Patient Re-evaluated prior to induction Oxygen Delivery Method: Circle system utilized Preoxygenation: Pre-oxygenation with 100% oxygen Induction Type: IV induction Ventilation: Mask ventilation without difficulty Laryngoscope Size: Mac, 3 and Glidescope Grade View: Grade I Tube type: Oral Tube size: 7.0 mm Number of attempts: 1 Airway Equipment and Method: Video-laryngoscopy and Bougie stylet Placement Confirmation: ETT inserted through vocal cords under direct vision, positive ETCO2 and breath sounds checked- equal and bilateral Secured at: 20 cm Tube secured with: Tape Dental Injury: Teeth and Oropharynx as per pre-operative assessment  Difficulty Due To: Difficult Airway- due to limited oral opening and Difficult Airway- due to reduced neck mobility

## 2024-04-23 NOTE — Anesthesia Preprocedure Evaluation (Signed)
 Anesthesia Evaluation  Patient identified by MRN, date of birth, ID band Patient awake    Reviewed: Allergy & Precautions, NPO status , Patient's Chart, lab work & pertinent test results  History of Anesthesia Complications Negative for: history of anesthetic complications  Airway Mallampati: III  TM Distance: >3 FB Neck ROM: Full    Dental no notable dental hx. (+) Teeth Intact   Pulmonary shortness of breath, neg sleep apnea, neg COPD, Patient abstained from smoking.Not current smoker, former smoker Patient had covid last year and since then her oxygen levels have hovered in the low 90s.   Pulmonary exam normal breath sounds clear to auscultation       Cardiovascular Exercise Tolerance: Good METShypertension, Pt. on medications (-) CAD and (-) Past MI (-) dysrhythmias  Rhythm:Regular Rate:Normal - Systolic murmurs    Neuro/Psych  PSYCHIATRIC DISORDERS Anxiety Depression    On chronic opioids, 1-2 hydrocodone  5mg  daily  Neuromuscular disease    GI/Hepatic ,neg GERD  ,,(+)     substance abuse  alcohol useDrinks 3-4 glasses of wine daily   Endo/Other  neg diabetesHypothyroidism  Class 3 obesity  Renal/GU negative Renal ROS     Musculoskeletal  (+) Arthritis ,    Abdominal  (+) + obese  Peds  Hematology   Anesthesia Other Findings Past Medical History: No date: Anemia No date: Anxiety No date: Collagenous colitis No date: Depression No date: Hyperlipidemia No date: Hypertension No date: Hypothyroidism No date: Lumbar herniated disc No date: Panic disorder  Reproductive/Obstetrics                              Anesthesia Physical Anesthesia Plan  ASA: 3  Anesthesia Plan: General   Post-op Pain Management: Dilaudid IV, Ketamine IV* and Tylenol  PO (pre-op)*   Induction: Intravenous  PONV Risk Score and Plan: 4 or greater and Ondansetron, Dexamethasone and Midazolam  Airway  Management Planned: Oral ETT and Video Laryngoscope Planned  Additional Equipment: None  Intra-op Plan:   Post-operative Plan: Extubation in OR  Informed Consent: I have reviewed the patients History and Physical, chart, labs and discussed the procedure including the risks, benefits and alternatives for the proposed anesthesia with the patient or authorized representative who has indicated his/her understanding and acceptance.     Dental advisory given  Plan Discussed with: CRNA and Surgeon  Anesthesia Plan Comments: (Discussed risks of anesthesia with patient, including PONV, sore throat, lip/dental/eye damage. Rare risks discussed as well, such as cardiorespiratory and neurological sequelae, and allergic reactions. Discussed the role of CRNA in patient's perioperative care. Patient understands. Patient informed about increased incidence of above perioperative risk due to high BMI. Patient understands.  Had a discussion about realistic pain control expectations postop)        Anesthesia Quick Evaluation

## 2024-04-24 ENCOUNTER — Other Ambulatory Visit (HOSPITAL_COMMUNITY): Payer: Self-pay

## 2024-04-24 DIAGNOSIS — M48062 Spinal stenosis, lumbar region with neurogenic claudication: Secondary | ICD-10-CM | POA: Diagnosis not present

## 2024-04-24 MED ORDER — CYCLOBENZAPRINE HCL 5 MG PO TABS
5.0000 mg | ORAL_TABLET | Freq: Three times a day (TID) | ORAL | 0 refills | Status: DC | PRN
  Filled 2024-04-24: qty 30, 10d supply, fill #0

## 2024-04-24 MED ORDER — OXYCODONE-ACETAMINOPHEN 5-325 MG PO TABS
1.0000 | ORAL_TABLET | ORAL | 0 refills | Status: DC | PRN
Start: 2024-04-24 — End: 2024-04-24

## 2024-04-24 MED ORDER — DOCUSATE SODIUM 100 MG PO CAPS: 100.0000 mg | ORAL_CAPSULE | Freq: Two times a day (BID) | ORAL | 0 refills | Status: DC

## 2024-04-24 MED ORDER — CYCLOBENZAPRINE HCL 5 MG PO TABS: 5.0000 mg | ORAL_TABLET | Freq: Three times a day (TID) | ORAL | Status: DC | PRN

## 2024-04-24 MED ORDER — DOCUSATE SODIUM 100 MG PO CAPS
100.0000 mg | ORAL_CAPSULE | Freq: Two times a day (BID) | ORAL | 0 refills | Status: DC
  Filled 2024-04-24: qty 30, 15d supply, fill #0

## 2024-04-24 MED ORDER — OXYCODONE-ACETAMINOPHEN 5-325 MG PO TABS
1.0000 | ORAL_TABLET | ORAL | 0 refills | Status: DC | PRN
  Filled 2024-04-24: qty 30, 3d supply, fill #0

## 2024-04-24 MED ORDER — CYCLOBENZAPRINE HCL 5 MG PO TABS: 5.0000 mg | ORAL_TABLET | Freq: Three times a day (TID) | ORAL | 0 refills | Status: DC | PRN

## 2024-04-24 MED ORDER — OXYCODONE-ACETAMINOPHEN 5-325 MG PO TABS
1.0000 | ORAL_TABLET | ORAL | Status: DC | PRN
  Administered 2024-04-24: 2 via ORAL
  Filled 2024-04-24: qty 2

## 2024-04-24 NOTE — Progress Notes (Signed)
 Patient alert and oriented, mae's well, voiding adequate amount of urine, swallowing without difficulty, no c/o pain at time of discharge. Patient discharged home with husband. Script and discharged instructions given to patient. Patient and husband stated understanding of instructions given. Room was checked and accounted for all patient's belongings; discharge instructions concerning her medications, incision care, follow up appointment and when to call the doctor as needed were all discussed with patient by RN and She expressed understanding on the instructions given.

## 2024-04-24 NOTE — Evaluation (Signed)
 Physical Therapy Evaluation  Patient Details Name: Jenny Yates MRN: [id number] DOB: 1961/01/09 Today's Date: 04/24/2024  History of Present Illness  Pt is a 63 y/o female who presents s/p L4-L5 PLIF on 04/23/2024. PMH significant for HTN, hypothyroidism, prior back surgery.  Clinical Impression  Pt admitted with above diagnosis. At the time of PT eval, pt was able to demonstrate transfers and ambulation with gross min assist to CGA and RW for support. Pt was educated on precautions, brace application/wearing schedule, appropriate activity progression, and car transfer. Pt currently with functional limitations due to the deficits listed below (see PT Problem List). Pt will benefit from skilled PT to increase their independence and safety with mobility to allow discharge to the venue listed below.          If plan is discharge home, recommend the following: A little help with walking and/or transfers;A little help with bathing/dressing/bathroom;Assistance with cooking/housework;Assist for transportation;Help with stairs or ramp for entrance   Can travel by private vehicle        Equipment Recommendations Rolling walker (2 wheels) (Youth size)  Recommendations for Other Services       Functional Status Assessment Patient has had a recent decline in their functional status and demonstrates the ability to make significant improvements in function in a reasonable and predictable amount of time.     Precautions / Restrictions Precautions Precautions: Fall;Back Precaution Booklet Issued: Yes (comment) Recall of Precautions/Restrictions: Intact Precaution/Restrictions Comments: Reviewed handout and pt was cued for precautions during functional mobility Required Braces or Orthoses: Spinal Brace Spinal Brace: Lumbar corset;Applied in sitting position Restrictions Weight Bearing Restrictions Per Provider Order: No      Mobility  Bed Mobility Overal bed mobility: Needs Assistance Bed  Mobility: Rolling, Sidelying to Sit, Sit to Sidelying Rolling: Min assist Sidelying to sit: Min assist     Sit to sidelying: Min assist General bed mobility comments: Assist for log roll technique. HOB flat. Rails lowered for transition to EOB however rails required for return to supine at end of session.    Transfers Overall transfer level: Needs assistance Equipment used: Rolling walker (2 wheels) Transfers: Sit to/from Stand Sit to Stand: Contact guard assist           General transfer comment: VC's for hand placement on seated surface for safety.    Ambulation/Gait Ambulation/Gait assistance: Contact guard assist Gait Distance (Feet): 200 Feet Assistive device: Rolling walker (2 wheels) Gait Pattern/deviations: Step-through pattern, Decreased stride length Gait velocity: Decreased Gait velocity interpretation: <1.31 ft/sec, indicative of household ambulator   General Gait Details: VC's for improved posture, closer walker proximity and forward gaze. No assist required but hands on guarding provided for safety throughout.  Stairs Stairs: Yes Stairs assistance: Contact guard assist Stair Management: Two rails, Step to pattern, Forwards Number of Stairs: 1 (x3) General stair comments: VC's for sequencing and general safety. [name] husband present for education as well. Pt and husband were offered gait belt, but declined taking gait belt home for safety on stairs.  Wheelchair Mobility     Tilt Bed    Modified Rankin (Stroke Patients Only)       Balance Overall balance assessment: Needs assistance Sitting-balance support: Feet supported, No upper extremity supported Sitting balance-Leahy Scale: Fair     Standing balance support: Bilateral upper extremity supported, During functional activity, Reliant on assistive device for balance Standing balance-Leahy Scale: Fair (statically)  Pertinent Vitals/Pain Pain  Assessment Pain Assessment: Faces Faces Pain Scale: Hurts little more Pain Location: incision site Pain Descriptors / Indicators: Operative site guarding, Sore Pain Intervention(s): Limited activity within patient's tolerance, Monitored during session, Repositioned    Home Living Family/patient expects to be discharged to:: Private residence Living Arrangements: Spouse/significant other Available Help at Discharge: Family;Available 24 hours/day Type of Home: House Home Access: Stairs to enter Entrance Stairs-Rails: Right;Left;Can reach both Entrance Stairs-Number of Steps: 2 (2 + a large threshold step) Alternate Level Stairs-Number of Steps: flight Home Layout: Two level;Able to live on main level with bedroom/bathroom Home Equipment: Shower seat;Grab bars - tub/shower;Cane - single point      Prior Function Prior Level of Function : Independent/Modified Independent             Mobility Comments: cane       Extremity/Trunk Assessment   Upper Extremity Assessment Upper Extremity Assessment: Overall WFL for tasks assessed    Lower Extremity Assessment Lower Extremity Assessment: Generalized weakness (Mild; consistent with pre-op diagnosis)    Cervical / Trunk Assessment Cervical / Trunk Assessment: Back Surgery  Communication   Communication Communication: No apparent difficulties    Cognition Arousal: Alert Behavior During Therapy: WFL for tasks assessed/performed   PT - Cognitive impairments: No apparent impairments                         Following commands: Intact       Cueing Cueing Techniques: Verbal cues, Gestural cues     General Comments      Exercises     Assessment/Plan    PT Assessment Patient needs continued PT services  PT Problem List Decreased strength;Decreased activity tolerance;Decreased balance;Decreased mobility;Decreased knowledge of use of DME;Decreased safety awareness;Decreased knowledge of precautions;Pain        PT Treatment Interventions DME instruction;Gait training;Stair training;Functional mobility training;Therapeutic activities;Therapeutic exercise;Balance training;Patient/family education    PT Goals (Current goals can be found in the Care Plan section)  Acute Rehab PT Goals Patient Stated Goal: home today; be able to walk around the park PT Goal Formulation: With patient/family Time For Goal Achievement: 05/01/24 Potential to Achieve Goals: Good    Frequency Min 5X/week     Co-evaluation               AM-PAC PT 6 Clicks Mobility  Outcome Measure Help needed turning from your back to your side while in a flat bed without using bedrails?: A Little Help needed moving from lying on your back to sitting on the side of a flat bed without using bedrails?: A Little Help needed moving to and from a bed to a chair (including a wheelchair)?: A Little Help needed standing up from a chair using your arms (e.g., wheelchair or bedside chair)?: A Little Help needed to walk in hospital room?: A Little Help needed climbing 3-5 steps with a railing? : A Little 6 Click Score: 18    End of Session Equipment Utilized During Treatment: Gait belt;Back brace Activity Tolerance: Patient tolerated treatment well Patient left: in bed;with call bell/phone within reach;with family/visitor present;with nursing/sitter in room Nurse Communication: Mobility status PT Visit Diagnosis: Unsteadiness on feet (R26.81);Pain Pain - part of body:  (back)    Time: 8986-8895 PT Time Calculation (min) (ACUTE ONLY): 51 min   Charges:   PT Evaluation $PT Eval Low Complexity: 1 Low PT Treatments $Gait Training: 23-37 mins PT General Charges $$ ACUTE PT VISIT: 1 Visit  Leita Sable, PT, DPT Acute Rehabilitation Services Secure Chat Preferred Office: [phone]   Leita JONETTA Sable 04/24/2024, 12:46 PM

## 2024-04-24 NOTE — Progress Notes (Signed)
 OT Cancellation Note  Patient Details Name: Jenny Yates MRN: [id number] DOB: 03/11/1961   Cancelled Treatment:    Reason Eval/Treat Not Completed: OT screened, no needs identified, will sign off.    Kenyon Eshleman D Paulena Servais 04/24/2024, 11:04 AM 04/24/2024  RP, OTR/L  Acute Rehabilitation Services  Office:  [phone]

## 2024-04-24 NOTE — Discharge Summary (Signed)
 Physician Discharge Summary  Patient ID: Jenny Yates MRN: [id number] DOB/AGE: 63-24-1962 63 y.o.  Admit date: 04/23/2024 Discharge date: 04/24/2024  Admission Diagnoses: Lumbar spondylolisthesis, lumbar facet arthropathy, lumbar spinal stenosis, lumbar radiculopathy, neurogenic claudication, lumbago  Discharge Diagnoses: The same Principal Problem:   Spondylolisthesis of lumbar region   Discharged Condition: good  Hospital Course: I performed an L4-5 decompression, instrumentation and fusion on the patient on 04/23/2024.  The surgery went well.  The patient's postoperative course was unremarkable.  On postoperative day #1 she requested discharge to home.  The patient, and her husband, were given verbal and written discharge instructions.  All her questions were answered.  Consults: PT, care management Significant Diagnostic Studies: None Treatments: L4-5 decompression, instrumentation and fusion. Discharge Exam: Blood pressure 110/70, pulse 70, temperature 98.7 F (37.1 C), temperature source Oral, resp. rate 16, height 4' 10 (1.473 m), weight 89.5 kg, SpO2 94%. The patient is alert and pleasant.  She looks well.  Her strength is normal.  Disposition: Home   Allergies as of 04/24/2024       Reactions   Codeine Other (See Comments)   Syncope knocks me to the floor   Epinephrine Other (See Comments)   Oral seizure-like activity   Azithromycin Rash   Iodine Swelling, Rash        Medication List     STOP taking these medications    celecoxib  200 MG capsule Commonly known as: CELEBREX    diazepam  5 MG tablet Commonly known as: VALIUM    HYDROcodone -acetaminophen  5-325 MG tablet Commonly known as: NORCO/VICODIN       TAKE these medications    ALPRAZolam  0.5 MG tablet Commonly known as: XANAX  Take 1 tablet (0.5 mg total) by mouth 3 (three) times daily as needed.   budesonide  3 MG 24 hr capsule Commonly known as: ENTOCORT EC  TAKE 3 CAPSULES BY MOUTH  EVERY DAY   cyanocobalamin 1000 MCG tablet Commonly known as: VITAMIN B12 Take 1,000 mcg by mouth in the morning.   cyclobenzaprine 5 MG tablet Commonly known as: FLEXERIL Take 1 tablet (5 mg total) by mouth 3 (three) times daily as needed for muscle spasms.   docusate sodium 100 MG capsule Commonly known as: COLACE Take 1 capsule (100 mg total) by mouth 2 (two) times daily.   fluticasone  50 MCG/ACT nasal spray Commonly known as: FLONASE  Place 1 spray into both nostrils daily. What changed:  when to take this reasons to take this   hydrALAZINE  50 MG tablet Commonly known as: APRESOLINE  Take 1 tablet (50 mg total) by mouth in the morning and at bedtime. TAKE 1 TABLET 2 TIMES DAILYWITH FOOD   hydrochlorothiazide  25 MG tablet Commonly known as: HYDRODIURIL  TAKE 1 TABLET DAILY   levothyroxine  100 MCG tablet Commonly known as: SYNTHROID  Take 1 tablet by mouth once daily   multivitamin with minerals Tabs tablet Take 1 tablet by mouth in the morning.   nebivolol  10 MG tablet Commonly known as: BYSTOLIC  Take 2 tablets (20 mg total) by mouth daily. What changed: when to take this   olmesartan  20 MG tablet Commonly known as: BENICAR  Take 1 tablet (20 mg total) by mouth in the morning and at bedtime.   oxyCODONE-acetaminophen  5-325 MG tablet Commonly known as: PERCOCET/ROXICET Take 1-2 tablets by mouth every 4 (four) hours as needed for moderate pain (pain score 4-6).   rosuvastatin  40 MG tablet Commonly known as: Crestor  Take 1 tablet (40 mg total) by mouth daily. What changed: when to take this  VITAMIN D3 PO Take 2,000 Units by mouth at bedtime.         Signed: [name] 04/24/2024, 8:00 AM

## 2024-04-24 NOTE — Discharge Instructions (Signed)
 Wound Care Keep incision covered and dry until post op day 3. You may remove the Honeycomb dressing on post op day 3. Leave steri-strips on back.  They will fall off by themselves. Do not put any creams, lotions, or ointments on incision. You are fine to shower. Let water run over incision and pat dry.   Activity Walk each and every day, increasing distance each day. No lifting greater than 8 lbs.  No lifting no bending no twisting no driving . 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 If provided with back brace, wear when out of bed.  It is not necessary to wear brace in bed.  Diet Resume your normal diet.  Call Your Doctor If Any of These Occur Redness, drainage, or swelling at the wound.  Temperature greater than 101 degrees. Severe pain not relieved by pain medication. Incision starts to come apart.  Follow Up Appt Call [phone] if you have one or any problem.

## 2024-04-25 MED FILL — Sodium Chloride IV Soln 0.9%: INTRAVENOUS | Qty: 1000 | Status: AC

## 2024-04-25 MED FILL — Heparin Sodium (Porcine) Inj 1000 Unit/ML: INTRAMUSCULAR | Qty: 30 | Status: AC

## 2024-04-29 ENCOUNTER — Other Ambulatory Visit (HOSPITAL_COMMUNITY): Payer: Self-pay

## 2024-04-29 ENCOUNTER — Other Ambulatory Visit: Payer: Self-pay | Admitting: Internal Medicine

## 2024-04-29 MED ORDER — CYCLOBENZAPRINE HCL 5 MG PO TABS
5.0000 mg | ORAL_TABLET | Freq: Three times a day (TID) | ORAL | 0 refills | Status: DC | PRN
  Filled 2024-04-29 – 2024-05-02 (×2): qty 30, 10d supply, fill #0

## 2024-04-29 MED ORDER — OXYCODONE-ACETAMINOPHEN 5-325 MG PO TABS
1.0000 | ORAL_TABLET | ORAL | 0 refills | Status: DC | PRN
  Filled 2024-04-29: qty 30, 3d supply, fill #0

## 2024-04-30 ENCOUNTER — Other Ambulatory Visit (HOSPITAL_COMMUNITY): Payer: Self-pay

## 2024-05-01 NOTE — Anesthesia Postprocedure Evaluation (Signed)
 Anesthesia Post Note  Patient: Jenny Yates  Procedure(s) Performed: POSTERIOR LUMBAR FUSION LUMBAR FOUR-FIVE     Patient location during evaluation: PACU Anesthesia Type: General Level of consciousness: awake and alert Pain management: pain level controlled Vital Signs Assessment: post-procedure vital signs reviewed and stable Respiratory status: spontaneous breathing, nonlabored ventilation and respiratory function stable Cardiovascular status: blood pressure returned to baseline and stable Postop Assessment: no apparent nausea or vomiting Anesthetic complications: no   No notable events documented.                  Camry Robello

## 2024-05-02 ENCOUNTER — Other Ambulatory Visit (HOSPITAL_COMMUNITY): Payer: Self-pay

## 2024-05-05 ENCOUNTER — Other Ambulatory Visit: Payer: Self-pay | Admitting: Internal Medicine

## 2024-05-07 ENCOUNTER — Other Ambulatory Visit (HOSPITAL_COMMUNITY): Payer: Self-pay

## 2024-05-08 ENCOUNTER — Emergency Department (HOSPITAL_COMMUNITY)

## 2024-05-08 ENCOUNTER — Other Ambulatory Visit (HOSPITAL_COMMUNITY): Payer: Self-pay

## 2024-05-08 ENCOUNTER — Other Ambulatory Visit: Payer: Self-pay

## 2024-05-08 ENCOUNTER — Inpatient Hospital Stay (HOSPITAL_COMMUNITY)
Admission: EM | Admit: 2024-05-08 | Discharge: 2024-05-14 | DRG: 682 | Disposition: A | Discharge status: Rehabilitation Facility | Attending: Emergency Medicine | Admitting: Emergency Medicine

## 2024-05-08 DIAGNOSIS — D72829 Elevated white blood cell count, unspecified: Secondary | ICD-10-CM | POA: Diagnosis present

## 2024-05-08 DIAGNOSIS — E860 Dehydration: Secondary | ICD-10-CM | POA: Diagnosis not present

## 2024-05-08 DIAGNOSIS — F1010 Alcohol abuse, uncomplicated: Secondary | ICD-10-CM | POA: Diagnosis present

## 2024-05-08 DIAGNOSIS — Z91041 Radiographic dye allergy status: Secondary | ICD-10-CM

## 2024-05-08 DIAGNOSIS — Z6841 Body Mass Index (BMI) 40.0 and over, adult: Secondary | ICD-10-CM | POA: Diagnosis not present

## 2024-05-08 DIAGNOSIS — B961 Klebsiella pneumoniae [K. pneumoniae] as the cause of diseases classified elsewhere: Secondary | ICD-10-CM | POA: Diagnosis not present

## 2024-05-08 DIAGNOSIS — M47812 Spondylosis without myelopathy or radiculopathy, cervical region: Secondary | ICD-10-CM | POA: Diagnosis not present

## 2024-05-08 DIAGNOSIS — Z751 Person awaiting admission to adequate facility elsewhere: Secondary | ICD-10-CM

## 2024-05-08 DIAGNOSIS — S3991XA Unspecified injury of abdomen, initial encounter: Secondary | ICD-10-CM | POA: Diagnosis not present

## 2024-05-08 DIAGNOSIS — I10 Essential (primary) hypertension: Secondary | ICD-10-CM | POA: Diagnosis present

## 2024-05-08 DIAGNOSIS — S32009D Unspecified fracture of unspecified lumbar vertebra, subsequent encounter for fracture with routine healing: Secondary | ICD-10-CM | POA: Diagnosis not present

## 2024-05-08 DIAGNOSIS — G8918 Other acute postprocedural pain: Secondary | ICD-10-CM | POA: Diagnosis present

## 2024-05-08 DIAGNOSIS — E538 Deficiency of other specified B group vitamins: Secondary | ICD-10-CM | POA: Diagnosis present

## 2024-05-08 DIAGNOSIS — G4700 Insomnia, unspecified: Secondary | ICD-10-CM | POA: Diagnosis present

## 2024-05-08 DIAGNOSIS — W06XXXA Fall from bed, initial encounter: Secondary | ICD-10-CM | POA: Diagnosis not present

## 2024-05-08 DIAGNOSIS — Z981 Arthrodesis status: Secondary | ICD-10-CM | POA: Diagnosis not present

## 2024-05-08 DIAGNOSIS — W19XXXA Unspecified fall, initial encounter: Principal | ICD-10-CM

## 2024-05-08 DIAGNOSIS — R0989 Other specified symptoms and signs involving the circulatory and respiratory systems: Secondary | ICD-10-CM | POA: Diagnosis not present

## 2024-05-08 DIAGNOSIS — Y92003 Bedroom of unspecified non-institutional (private) residence as the place of occurrence of the external cause: Secondary | ICD-10-CM

## 2024-05-08 DIAGNOSIS — R5381 Other malaise: Secondary | ICD-10-CM | POA: Diagnosis present

## 2024-05-08 DIAGNOSIS — Z885 Allergy status to narcotic agent status: Secondary | ICD-10-CM | POA: Diagnosis not present

## 2024-05-08 DIAGNOSIS — F410 Panic disorder [episodic paroxysmal anxiety] without agoraphobia: Secondary | ICD-10-CM | POA: Diagnosis present

## 2024-05-08 DIAGNOSIS — E876 Hypokalemia: Secondary | ICD-10-CM | POA: Diagnosis not present

## 2024-05-08 DIAGNOSIS — K5900 Constipation, unspecified: Secondary | ICD-10-CM | POA: Diagnosis not present

## 2024-05-08 DIAGNOSIS — G894 Chronic pain syndrome: Secondary | ICD-10-CM | POA: Diagnosis not present

## 2024-05-08 DIAGNOSIS — S3993XA Unspecified injury of pelvis, initial encounter: Secondary | ICD-10-CM | POA: Diagnosis not present

## 2024-05-08 DIAGNOSIS — F32A Depression, unspecified: Secondary | ICD-10-CM | POA: Diagnosis not present

## 2024-05-08 DIAGNOSIS — S51011A Laceration without foreign body of right elbow, initial encounter: Secondary | ICD-10-CM | POA: Diagnosis present

## 2024-05-08 DIAGNOSIS — E871 Hypo-osmolality and hyponatremia: Secondary | ICD-10-CM | POA: Diagnosis present

## 2024-05-08 DIAGNOSIS — E66813 Obesity, class 3: Secondary | ICD-10-CM | POA: Diagnosis present

## 2024-05-08 DIAGNOSIS — Z8249 Family history of ischemic heart disease and other diseases of the circulatory system: Secondary | ICD-10-CM | POA: Diagnosis not present

## 2024-05-08 DIAGNOSIS — M4802 Spinal stenosis, cervical region: Secondary | ICD-10-CM | POA: Diagnosis not present

## 2024-05-08 DIAGNOSIS — Z881 Allergy status to other antibiotic agents status: Secondary | ICD-10-CM

## 2024-05-08 DIAGNOSIS — K5901 Slow transit constipation: Secondary | ICD-10-CM | POA: Diagnosis not present

## 2024-05-08 DIAGNOSIS — F411 Generalized anxiety disorder: Secondary | ICD-10-CM | POA: Diagnosis not present

## 2024-05-08 DIAGNOSIS — F39 Unspecified mood [affective] disorder: Secondary | ICD-10-CM | POA: Diagnosis present

## 2024-05-08 DIAGNOSIS — S32009A Unspecified fracture of unspecified lumbar vertebra, initial encounter for closed fracture: Secondary | ICD-10-CM

## 2024-05-08 DIAGNOSIS — E039 Hypothyroidism, unspecified: Secondary | ICD-10-CM | POA: Diagnosis present

## 2024-05-08 DIAGNOSIS — Z043 Encounter for examination and observation following other accident: Secondary | ICD-10-CM | POA: Diagnosis not present

## 2024-05-08 DIAGNOSIS — M545 Low back pain, unspecified: Secondary | ICD-10-CM | POA: Diagnosis not present

## 2024-05-08 DIAGNOSIS — N179 Acute kidney failure, unspecified: Principal | ICD-10-CM | POA: Diagnosis present

## 2024-05-08 DIAGNOSIS — Z888 Allergy status to other drugs, medicaments and biological substances status: Secondary | ICD-10-CM | POA: Diagnosis not present

## 2024-05-08 DIAGNOSIS — E785 Hyperlipidemia, unspecified: Secondary | ICD-10-CM | POA: Diagnosis not present

## 2024-05-08 DIAGNOSIS — M48061 Spinal stenosis, lumbar region without neurogenic claudication: Secondary | ICD-10-CM | POA: Diagnosis not present

## 2024-05-08 DIAGNOSIS — G9341 Metabolic encephalopathy: Secondary | ICD-10-CM | POA: Diagnosis present

## 2024-05-08 DIAGNOSIS — S32010A Wedge compression fracture of first lumbar vertebra, initial encounter for closed fracture: Secondary | ICD-10-CM | POA: Diagnosis not present

## 2024-05-08 DIAGNOSIS — I959 Hypotension, unspecified: Secondary | ICD-10-CM | POA: Diagnosis not present

## 2024-05-08 DIAGNOSIS — K52831 Collagenous colitis: Secondary | ICD-10-CM | POA: Diagnosis not present

## 2024-05-08 DIAGNOSIS — Z4789 Encounter for other orthopedic aftercare: Secondary | ICD-10-CM | POA: Diagnosis not present

## 2024-05-08 DIAGNOSIS — Z87891 Personal history of nicotine dependence: Secondary | ICD-10-CM | POA: Diagnosis not present

## 2024-05-08 DIAGNOSIS — N390 Urinary tract infection, site not specified: Secondary | ICD-10-CM | POA: Diagnosis not present

## 2024-05-08 DIAGNOSIS — R000 Tachycardia, unspecified: Secondary | ICD-10-CM | POA: Diagnosis not present

## 2024-05-08 DIAGNOSIS — Z79899 Other long term (current) drug therapy: Secondary | ICD-10-CM

## 2024-05-08 DIAGNOSIS — W19XXXD Unspecified fall, subsequent encounter: Secondary | ICD-10-CM | POA: Diagnosis not present

## 2024-05-08 DIAGNOSIS — R911 Solitary pulmonary nodule: Secondary | ICD-10-CM | POA: Diagnosis present

## 2024-05-08 DIAGNOSIS — I9589 Other hypotension: Secondary | ICD-10-CM | POA: Diagnosis not present

## 2024-05-08 DIAGNOSIS — S199XXA Unspecified injury of neck, initial encounter: Secondary | ICD-10-CM | POA: Diagnosis not present

## 2024-05-08 DIAGNOSIS — Z7409 Other reduced mobility: Secondary | ICD-10-CM | POA: Diagnosis present

## 2024-05-08 DIAGNOSIS — Z79890 Hormone replacement therapy (postmenopausal): Secondary | ICD-10-CM

## 2024-05-08 DIAGNOSIS — D62 Acute posthemorrhagic anemia: Secondary | ICD-10-CM | POA: Diagnosis not present

## 2024-05-08 DIAGNOSIS — S0990XA Unspecified injury of head, initial encounter: Secondary | ICD-10-CM | POA: Diagnosis not present

## 2024-05-08 DIAGNOSIS — N3000 Acute cystitis without hematuria: Secondary | ICD-10-CM | POA: Diagnosis not present

## 2024-05-08 DIAGNOSIS — S299XXA Unspecified injury of thorax, initial encounter: Secondary | ICD-10-CM | POA: Diagnosis not present

## 2024-05-08 DIAGNOSIS — M5416 Radiculopathy, lumbar region: Secondary | ICD-10-CM | POA: Diagnosis not present

## 2024-05-08 DIAGNOSIS — Z791 Long term (current) use of non-steroidal anti-inflammatories (NSAID): Secondary | ICD-10-CM

## 2024-05-08 DIAGNOSIS — R9389 Abnormal findings on diagnostic imaging of other specified body structures: Secondary | ICD-10-CM | POA: Diagnosis not present

## 2024-05-08 DIAGNOSIS — G9340 Encephalopathy, unspecified: Secondary | ICD-10-CM | POA: Diagnosis not present

## 2024-05-08 LAB — BASIC METABOLIC PANEL WITH GFR
Anion gap: 11 (ref 5–15)
Anion gap: 12 (ref 5–15)
BUN: 25 mg/dL — ABNORMAL HIGH (ref 8–23)
BUN: 21 mg/dL (ref 8–23)
CO2: 25 mmol/L (ref 22–32)
CO2: 23 mmol/L (ref 22–32)
Calcium: 9.2 mg/dL (ref 8.9–10.3)
Calcium: 9.2 mg/dL (ref 8.9–10.3)
Chloride: 93 mmol/L — ABNORMAL LOW (ref 98–111)
Chloride: 94 mmol/L — ABNORMAL LOW (ref 98–111)
Creatinine, Ser: 0.95 mg/dL (ref 0.44–1.00)
Creatinine, Ser: 1.03 mg/dL — ABNORMAL HIGH (ref 0.44–1.00)
GFR, Estimated: >60 mL/min (ref >60)
GFR, Estimated: >60 mL/min (ref >60)
Glucose, Bld: 117 mg/dL — ABNORMAL HIGH (ref 70–99)
Glucose, Bld: 110 mg/dL — ABNORMAL HIGH (ref 70–99)
Potassium: 3.6 mmol/L (ref 3.5–5.1)
Potassium: 3.7 mmol/L (ref 3.5–5.1)
Sodium: 129 mmol/L — ABNORMAL LOW (ref 135–145)
Sodium: 129 mmol/L — ABNORMAL LOW (ref 135–145)

## 2024-05-08 LAB — I-STAT CHEM 8, ED
BUN: 33 mg/dL — ABNORMAL HIGH (ref 8–23)
Calcium, Ion: 1.25 mmol/L (ref 1.15–1.40)
Chloride: 86 mmol/L — ABNORMAL LOW (ref 98–111)
Creatinine, Ser: 1.6 mg/dL — ABNORMAL HIGH (ref 0.44–1.00)
Glucose, Bld: 124 mg/dL — ABNORMAL HIGH (ref 70–99)
HCT: 32 % — ABNORMAL LOW (ref 36.0–46.0)
Hemoglobin: 10.9 g/dL — ABNORMAL LOW (ref 12.0–15.0)
Potassium: 4.2 mmol/L (ref 3.5–5.1)
Sodium: 123 mmol/L — ABNORMAL LOW (ref 135–145)
TCO2: 28 mmol/L (ref 22–32)

## 2024-05-08 LAB — URINALYSIS, ROUTINE W REFLEX MICROSCOPIC
Bilirubin Urine: NEGATIVE
Glucose, UA: NEGATIVE mg/dL
Ketones, ur: NEGATIVE mg/dL
Nitrite: POSITIVE — AB
Protein, ur: NEGATIVE mg/dL
Specific Gravity, Urine: 1.006 (ref 1.005–1.030)
WBC, UA: >50 WBC/hpf (ref 0–5)
pH: 6 (ref 5.0–8.0)

## 2024-05-08 LAB — CBC WITH DIFFERENTIAL/PLATELET
Abs Immature Granulocytes: 0.11 K/uL — ABNORMAL HIGH (ref 0.00–0.07)
Basophils Absolute: 0.1 K/uL (ref 0.0–0.1)
Basophils Relative: 1 %
Eosinophils Absolute: 0.1 K/uL (ref 0.0–0.5)
Eosinophils Relative: 0 %
HCT: 32.3 % — ABNORMAL LOW (ref 36.0–46.0)
Hemoglobin: 11 g/dL — ABNORMAL LOW (ref 12.0–15.0)
Immature Granulocytes: 1 %
Lymphocytes Relative: 9 %
Lymphs Abs: 1 K/uL (ref 0.7–4.0)
MCH: 32.2 pg (ref 26.0–34.0)
MCHC: 34.1 g/dL (ref 30.0–36.0)
MCV: 94.4 fL (ref 80.0–100.0)
Monocytes Absolute: 1.2 K/uL — ABNORMAL HIGH (ref 0.1–1.0)
Monocytes Relative: 10 %
Neutro Abs: 9.1 K/uL — ABNORMAL HIGH (ref 1.7–7.7)
Neutrophils Relative %: 79 %
Platelets: 270 K/uL (ref 150–400)
RBC: 3.42 MIL/uL — ABNORMAL LOW (ref 3.87–5.11)
RDW: 13 % (ref 11.5–15.5)
WBC: 11.5 K/uL — ABNORMAL HIGH (ref 4.0–10.5)
nRBC: 0 % (ref 0.0–0.2)

## 2024-05-08 LAB — COMPREHENSIVE METABOLIC PANEL WITH GFR
ALT: 22 U/L (ref 0–44)
AST: 39 U/L (ref 15–41)
Albumin: 2.5 g/dL — ABNORMAL LOW (ref 3.5–5.0)
Alkaline Phosphatase: 96 U/L (ref 38–126)
Anion gap: 12 (ref 5–15)
BUN: 29 mg/dL — ABNORMAL HIGH (ref 8–23)
CO2: 23 mmol/L (ref 22–32)
Calcium: 9.8 mg/dL (ref 8.9–10.3)
Chloride: 87 mmol/L — ABNORMAL LOW (ref 98–111)
Creatinine, Ser: 1.52 mg/dL — ABNORMAL HIGH (ref 0.44–1.00)
GFR, Estimated: 38 mL/min — ABNORMAL LOW (ref >60)
Glucose, Bld: 128 mg/dL — ABNORMAL HIGH (ref 70–99)
Potassium: 4.1 mmol/L (ref 3.5–5.1)
Sodium: 122 mmol/L — ABNORMAL LOW (ref 135–145)
Total Bilirubin: 0.5 mg/dL (ref 0.0–1.2)
Total Protein: 5.3 g/dL — ABNORMAL LOW (ref 6.5–8.1)

## 2024-05-08 LAB — SAMPLE TO BLOOD BANK

## 2024-05-08 LAB — PROTIME-INR
INR: 1.1 (ref 0.8–1.2)
Prothrombin Time: 14.3 s (ref 11.4–15.2)

## 2024-05-08 LAB — TYPE AND SCREEN
ABO/RH(D): AB POS
Antibody Screen: NEGATIVE

## 2024-05-08 LAB — I-STAT CG4 LACTIC ACID, ED: Lactic Acid, Venous: 1.9 mmol/L (ref 0.5–1.9)

## 2024-05-08 MED ORDER — CYCLOBENZAPRINE HCL 5 MG PO TABS: 5.0000 mg | ORAL_TABLET | Freq: Three times a day (TID) | ORAL | Status: DC | PRN

## 2024-05-08 MED ORDER — ROSUVASTATIN CALCIUM 20 MG PO TABS
40.0000 mg | ORAL_TABLET | Freq: Every day | ORAL | Status: DC
  Administered 2024-05-09 – 2024-05-14 (×6): 40 mg via ORAL
  Filled 2024-05-08 (×6): qty 2

## 2024-05-08 MED ORDER — OXYCODONE HCL 5 MG PO TABS
5.0000 mg | ORAL_TABLET | ORAL | Status: DC | PRN
  Administered 2024-05-08: 5 mg via ORAL
  Filled 2024-05-08: qty 1

## 2024-05-08 MED ORDER — POLYETHYLENE GLYCOL 3350 17 G PO PACK: 17.0000 g | PACK | Freq: Every day | ORAL | Status: DC | PRN

## 2024-05-08 MED ORDER — ACETAMINOPHEN 650 MG RE SUPP: 650.0000 mg | Freq: Four times a day (QID) | RECTAL | Status: DC | PRN

## 2024-05-08 MED ORDER — MIDODRINE HCL 5 MG PO TABS
10.0000 mg | ORAL_TABLET | Freq: Three times a day (TID) | ORAL | Status: DC
  Administered 2024-05-08 – 2024-05-11 (×9): 10 mg via ORAL
  Filled 2024-05-08 (×9): qty 2

## 2024-05-08 MED ORDER — BUDESONIDE 3 MG PO CPEP
9.0000 mg | ORAL_CAPSULE | Freq: Every day | ORAL | Status: DC
  Administered 2024-05-10 – 2024-05-14 (×5): 9 mg via ORAL
  Filled 2024-05-08 (×6): qty 3

## 2024-05-08 MED ORDER — ALBUTEROL SULFATE (2.5 MG/3ML) 0.083% IN NEBU
2.5000 mg | INHALATION_SOLUTION | RESPIRATORY_TRACT | Status: DC | PRN
Start: 2024-05-08 — End: 2024-05-14

## 2024-05-08 MED ORDER — FLUTICASONE PROPIONATE 50 MCG/ACT NA SUSP: 1.0000 | Freq: Every day | NASAL | Status: DC | PRN

## 2024-05-08 MED ORDER — ALPRAZOLAM 0.25 MG PO TABS: 0.2500 mg | ORAL_TABLET | Freq: Three times a day (TID) | ORAL | Status: DC | PRN

## 2024-05-08 MED ORDER — MORPHINE SULFATE (PF) 2 MG/ML IV SOLN
1.0000 mg | INTRAVENOUS | Status: DC | PRN
  Filled 2024-05-08: qty 1

## 2024-05-08 MED ORDER — LEVOTHYROXINE SODIUM 50 MCG PO TABS
100.0000 ug | ORAL_TABLET | Freq: Every day | ORAL | Status: DC
  Administered 2024-05-09 – 2024-05-14 (×6): 100 ug via ORAL
  Filled 2024-05-08: qty 2
  Filled 2024-05-08: qty 1
  Filled 2024-05-08: qty 2
  Filled 2024-05-08 (×3): qty 1

## 2024-05-08 MED ORDER — SODIUM CHLORIDE 0.9 % IV SOLN: INTRAVENOUS | Status: DC

## 2024-05-08 MED ORDER — BISACODYL 10 MG RE SUPP: 10.0000 mg | Freq: Every day | RECTAL | Status: DC | PRN

## 2024-05-08 MED ORDER — ACETAMINOPHEN 325 MG PO TABS
650.0000 mg | ORAL_TABLET | Freq: Four times a day (QID) | ORAL | Status: DC | PRN
  Administered 2024-05-10 – 2024-05-13 (×3): 650 mg via ORAL
  Filled 2024-05-08 (×3): qty 2

## 2024-05-08 MED ORDER — SODIUM CHLORIDE 0.9 % IV BOLUS
1000.0000 mL | Freq: Once | INTRAVENOUS | Status: AC
  Administered 2024-05-08: 1000 mL via INTRAVENOUS

## 2024-05-08 MED ORDER — OXYCODONE-ACETAMINOPHEN 5-325 MG PO TABS
1.0000 | ORAL_TABLET | Freq: Four times a day (QID) | ORAL | 0 refills | Status: DC | PRN
  Filled 2024-05-08: qty 40, 5d supply, fill #0

## 2024-05-08 MED ORDER — VITAMIN B-12 1000 MCG PO TABS
1000.0000 ug | ORAL_TABLET | Freq: Every morning | ORAL | Status: DC
  Administered 2024-05-09 – 2024-05-13 (×5): 1000 ug via ORAL
  Filled 2024-05-08 (×5): qty 1

## 2024-05-08 MED ORDER — ADULT MULTIVITAMIN W/MINERALS CH
1.0000 | ORAL_TABLET | Freq: Every morning | ORAL | Status: DC
  Administered 2024-05-09 – 2024-05-14 (×6): 1 via ORAL
  Filled 2024-05-08 (×6): qty 1

## 2024-05-08 NOTE — Consult Note (Signed)
 Providing Compassionate, Quality Care - Together   Reason for Consult: Postoperative pain Referring Physician: Dr. Pamella Augustin Jenny Yates is an 63 y.o. female.  HPI: Jenny Yates is a 63 year old female with a past medical history outlined below. She underwent an L4-5 posterior lateral and interbody fusion by Dr. Mavis on 04/23/2024. She has been mobilizing at home, but has grown weaker over the last week. Her husband is at the bedside, assisting with the patient's recent history. Jenny Yates reports Jenny Yates fell out of bed this morning. She had an increase in low back pain and was hypotensive. She was brought to the [facility] Emergency Department by EMS. She was found to be severely dehydrated and received 2L bolus of NS. She complains of back pain and being cold. Her husband reports no issues with the patient's surgical wound. CT imaging of the patient's lumbar spine that was performed in the ED shows possible nondisplaced fracture of the left L4 transverse process. Her hardware is intact. No other acute abnormalities noted. Neurosurgery was consulted for further evaluation and recommendations.  Past Medical History:  Diagnosis Date   Anemia    Anxiety    Collagenous colitis    Depression    Hyperlipidemia    Hypertension    Hypothyroidism    Lumbar herniated disc    Panic disorder     Past Surgical History:  Procedure Laterality Date   BACK SURGERY     2007   HERNIA REPAIR     TUBAL LIGATION      No family history on file.  Social History:  reports that she has quit smoking. Her smoking use included cigarettes. She has never used smokeless tobacco. She reports current alcohol use. She reports that she does not use drugs.  Allergies:  Allergies  Allergen Reactions   Codeine Other (See Comments)    Syncope knocks me to the floor   Epinephrine Other (See Comments)    Oral seizure-like activity   Azithromycin Rash   Iodine Swelling and Rash    Medications: I  have reviewed the patient's current medications.  Results for orders placed or performed during the hospital encounter of 05/08/24 (from the past 48 hours)  Sample to Blood Bank     Status: None   Collection Time: 05/08/24  8:05 AM  Result Value Ref Range   Blood Bank Specimen SAMPLE AVAILABLE FOR TESTING    Sample Expiration      05/11/2024,2359 Performed at [facility], [address] [place], [place] [address]   Comprehensive metabolic panel     Status: Abnormal   Collection Time: 05/08/24  8:20 AM  Result Value Ref Range   Sodium 122 (L) 135 - 145 mmol/L   Potassium 4.1 3.5 - 5.1 mmol/L   Chloride 87 (L) 98 - 111 mmol/L   CO2 23 22 - 32 mmol/L   Glucose, Bld 128 (H) 70 - 99 mg/dL    Comment: Glucose reference range applies only to samples taken after fasting for at least 8 hours.   BUN 29 (H) 8 - 23 mg/dL   Creatinine, Ser 8.47 (H) 0.44 - 1.00 mg/dL   Calcium  9.8 8.9 - 10.3 mg/dL   Total Protein 5.3 (L) 6.5 - 8.1 g/dL   Albumin 2.5 (L) 3.5 - 5.0 g/dL   AST 39 15 - 41 U/L   ALT 22 0 - 44 U/L   Alkaline Phosphatase 96 38 - 126 U/L   Total  Bilirubin 0.5 0.0 - 1.2 mg/dL   GFR, Estimated 38 (L) >60 mL/min    Comment: (NOTE) Calculated using the CKD-EPI Creatinine Equation (2021)    Anion gap 12 5 - 15    Comment: Performed at [facility], [address] [place], [place] [address]  CBC with Differential     Status: Abnormal   Collection Time: 05/08/24  8:20 AM  Result Value Ref Range   WBC 11.5 (H) 4.0 - 10.5 K/uL   RBC 3.42 (L) 3.87 - 5.11 MIL/uL   Hemoglobin 11.0 (L) 12.0 - 15.0 g/dL   HCT 67.6 (L) 63.9 - 53.9 %   MCV 94.4 80.0 - 100.0 fL   MCH 32.2 26.0 - 34.0 pg   MCHC 34.1 30.0 - 36.0 g/dL   RDW 86.9 88.4 - 84.4 %   Platelets 270 150 - 400 K/uL   nRBC 0.0 0.0 - 0.2 %   Neutrophils Relative % 79 %   Neutro Abs 9.1 (H) 1.7 - 7.7 K/uL   Lymphocytes Relative 9 %   Lymphs Abs 1.0 0.7 - 4.0 K/uL   Monocytes Relative 10 %   Monocytes Absolute 1.2 (H)  0.1 - 1.0 K/uL   Eosinophils Relative 0 %   Eosinophils Absolute 0.1 0.0 - 0.5 K/uL   Basophils Relative 1 %   Basophils Absolute 0.1 0.0 - 0.1 K/uL   Immature Granulocytes 1 %   Abs Immature Granulocytes 0.11 (H) 0.00 - 0.07 K/uL    Comment: Performed at [facility], [address] [place], [place] [address]  Type and screen [facility]     Status: None   Collection Time: 05/08/24  8:20 AM  Result Value Ref Range   ABO/RH(D) AB POS    Antibody Screen NEG    Sample Expiration      05/11/2024,2359 Performed at [facility], [address], [place], [place] [address]   Protime-INR     Status: None   Collection Time: 05/08/24  8:20 AM  Result Value Ref Range   Prothrombin Time 14.3 11.4 - 15.2 seconds   INR 1.1 0.8 - 1.2    Comment: (NOTE) INR goal varies based on device and disease states. Performed at [facility], [address], [place], [place] [address]   I-Stat Chem 8, ED     Status: Abnormal   Collection Time: 05/08/24  8:27 AM  Result Value Ref Range   Sodium 123 (L) 135 - 145 mmol/L   Potassium 4.2 3.5 - 5.1 mmol/L   Chloride 86 (L) 98 - 111 mmol/L   BUN 33 (H) 8 - 23 mg/dL   Creatinine, Ser 8.39 (H) 0.44 - 1.00 mg/dL   Glucose, Bld 875 (H) 70 - 99 mg/dL    Comment: Glucose reference range applies only to samples taken after fasting for at least 8 hours.   Calcium , Ion 1.25 1.15 - 1.40 mmol/L   TCO2 28 22 - 32 mmol/L   Hemoglobin 10.9 (L) 12.0 - 15.0 g/dL   HCT 67.9 (L) 63.9 - 53.9 %  I-Stat Lactic Acid, ED     Status: None   Collection Time: 05/08/24  8:28 AM  Result Value Ref Range   Lactic Acid, Venous 1.9 0.5 - 1.9 mmol/L    CT T-SPINE NO CHARGE Result Date: 05/08/2024 CLINICAL DATA:  63 year old with fall this morning and complaint of back pain. Had back surgery 2 weeks ago per husband, per patient  she had back surgery 15 years ago, arrives with back brace EXAM: CT THORACIC SPINE WITHOUT CONTRAST; CT LUMBAR SPINE  WITHOUT CONTRAST TECHNIQUE: Multidetector CT images of the thoracic and lumbar spine were obtained using the standard protocol without intravenous contrast. RADIATION DOSE REDUCTION: This exam was performed according to the departmental dose-optimization program which includes automated exposure control, adjustment of the mA and/or kV according to patient size and/or use of iterative reconstruction technique. COMPARISON:  MRI lumbar spine 03/23/2024, lumbar spine radiographs 02/04/2024 and 01/25/2024 FINDINGS: There are postsurgical changes relating to L4-L5 posterior spinal fusion with transpedicular screws, lateral rods and interbody device. There is 5 mm grade 1 anterolisthesis of L4 on L5, post fusion, similar to preoperative exam. The orthopedic hardware appears intact. The left L5 transpedicular screw tip extends slightly beyond the anterior L5 vertebral body cortex. Alignment: There is otherwise no significant listhesis. Vertebrae: There is suggestion of nondisplaced fracture of the left L4 transverse process (series 3, image 57 and series 5, image 39) with possible extension into the facet adjacent to the transpedicular screw (series 3, image 57). Otherwise no acute fracture or focal pathologic process. Again limbus L4 vertebra. Paraspinal and other soft tissues: Ill-defined right apical pulmonary ground-glass opacity, please see separately dictated report for CT chest, abdomen, and pelvis for further relevant evaluation. There is multilevel degenerative intervertebral disc height loss. IMPRESSION: Suspicion for nondisplaced fracture of the left L4 transverse process with possible extension adjacent to the transpedicular screw. Otherwise no acute fracture or traumatic malalignment of the thoracic or lumbar spine. Postsurgical changes relating to L4-L5 posterior spinal fusion. The orthopedic hardware appears intact. Electronically Signed   By: Prentice Spade M.D.   On: 05/08/2024 10:29   CT L-SPINE NO  CHARGE Result Date: 05/08/2024 CLINICAL DATA:  63 year old with fall this morning and complaint of back pain. Had back surgery 2 weeks ago per husband, per patient she had back surgery 15 years ago, arrives with back brace EXAM: CT THORACIC SPINE WITHOUT CONTRAST; CT LUMBAR SPINE WITHOUT CONTRAST TECHNIQUE: Multidetector CT images of the thoracic and lumbar spine were obtained using the standard protocol without intravenous contrast. RADIATION DOSE REDUCTION: This exam was performed according to the departmental dose-optimization program which includes automated exposure control, adjustment of the mA and/or kV according to patient size and/or use of iterative reconstruction technique. COMPARISON:  MRI lumbar spine 03/23/2024, lumbar spine radiographs 02/04/2024 and 01/25/2024 FINDINGS: There are postsurgical changes relating to L4-L5 posterior spinal fusion with transpedicular screws, lateral rods and interbody device. There is 5 mm grade 1 anterolisthesis of L4 on L5, post fusion, similar to preoperative exam. The orthopedic hardware appears intact. The left L5 transpedicular screw tip extends slightly beyond the anterior L5 vertebral body cortex. Alignment: There is otherwise no significant listhesis. Vertebrae: There is suggestion of nondisplaced fracture of the left L4 transverse process (series 3, image 57 and series 5, image 39) with possible extension into the facet adjacent to the transpedicular screw (series 3, image 57). Otherwise no acute fracture or focal pathologic process. Again limbus L4 vertebra. Paraspinal and other soft tissues: Ill-defined right apical pulmonary ground-glass opacity, please see separately dictated report for CT chest, abdomen, and pelvis for further relevant evaluation. There is multilevel degenerative intervertebral disc height loss. IMPRESSION: Suspicion for nondisplaced fracture of the left L4 transverse process with possible extension adjacent to the transpedicular screw.  Otherwise no acute fracture or traumatic malalignment of the thoracic or lumbar spine. Postsurgical changes relating to L4-L5 posterior spinal fusion.  The orthopedic hardware appears intact. Electronically Signed   By: Prentice Spade M.D.   On: 05/08/2024 10:29   CT CHEST ABDOMEN PELVIS WO CONTRAST Result Date: 05/08/2024 EXAM: CT CHEST, ABDOMEN AND PELVIS WITHOUT CONTRAST 05/08/2024 08:48:00 AM TECHNIQUE: CT of the chest, abdomen and pelvis was performed without the administration of intravenous contrast. Multiplanar reformatted images are provided for review. Automated exposure control, iterative reconstruction, and/or weight based adjustment of the mA/kV was utilized to reduce the radiation dose to as low as reasonably achievable. COMPARISON: None available. CLINICAL HISTORY: Polytrauma, blunt. FINDINGS: CHEST: MEDIASTINUM AND LYMPH NODES: Heart and pericardium are unremarkable. The central airways are clear. No mediastinal, hilar or axillary lymphadenopathy. LUNGS AND PLEURA: Minimal bibasilar subsegmental atelectasis is noted. A 13 x 6 mm ground-glass opacity is seen in the right lung apex. Follow-up unenhanced chest CT in 3 months is recommended to ensure stability and rule out neoplasm. No focal consolidation or pulmonary edema. No pleural effusion or pneumothorax. ABDOMEN AND PELVIS: LIVER: The liver is unremarkable. GALLBLADDER AND BILE DUCTS: Status post cholecystectomy. No biliary ductal dilatation. SPLEEN: No acute abnormality. PANCREAS: No acute abnormality. ADRENAL GLANDS: No acute abnormality. KIDNEYS, URETERS AND BLADDER: No stones in the kidneys or ureters. No hydronephrosis. No perinephric or periureteral stranding. Urinary bladder is unremarkable. GI AND BOWEL: Stomach demonstrates no acute abnormality. There is no bowel obstruction. REPRODUCTIVE ORGANS: No acute abnormality. PERITONEUM AND RETROPERITONEUM: No ascites. No free air. VASCULATURE: Aorta is normal in caliber. ABDOMINAL AND PELVIS  LYMPH NODES: No lymphadenopathy. REPRODUCTIVE ORGANS: No acute abnormality. BONES AND SOFT TISSUES: No acute osseous abnormality. No focal soft tissue abnormality. IMPRESSION: 1. 13 x 6 mm ground-glass nodule in the right lung apex. Recommend chest CT at 612 months to confirm persistence; if persistent, then CT every 2 years until 5 years. If growth or solid component develops, consider resection. 2. No definite traumatic abnormality seen. Electronically signed by: Lynwood Seip MD 05/08/2024 09:38 AM EST RP Workstation: [id number]   CT CERVICAL SPINE WO CONTRAST Result Date: 05/08/2024 CLINICAL DATA:  63 year old with blunt poly trauma EXAM: CT CERVICAL SPINE WITHOUT CONTRAST TECHNIQUE: Multidetector CT imaging of the cervical spine was performed without intravenous contrast. Multiplanar CT image reconstructions were also generated. RADIATION DOSE REDUCTION: This exam was performed according to the departmental dose-optimization program which includes automated exposure control, adjustment of the mA and/or kV according to patient size and/or use of iterative reconstruction technique. COMPARISON:  None Available. FINDINGS: Alignment: There is overall straightening of the normal cervical lordosis with multilevel trace, likely degenerative, anterolisthesis C2 on C3, C3 on C4, and C4 on C5 without evidence of acute or traumatic vertebral column listhesis. Skull base and vertebrae: No acute fracture. No primary bone lesion or focal pathologic process. Soft tissues and spinal canal: Limited evaluation of the spinal canal secondary to photon starvation artifact. Within this limitation, no high-grade canal stenosis is seen. Unremarkable extra-spinal soft tissues. Disc levels: There is multilevel uncovertebral spurring and facet arthropathy with up to moderate/ severe neural foraminal narrowing, generally worse on the left. Upper chest: See dedicated chest imaging. Other: None. IMPRESSION: No acute fracture or traumatic  malalignment of the cervical spine. Electronically Signed   By: Prentice Spade M.D.   On: 05/08/2024 09:26   CT HEAD WO CONTRAST Result Date: 05/08/2024 CLINICAL DATA:  63 year old with moderate to severe head trauma EXAM: CT HEAD WITHOUT CONTRAST TECHNIQUE: Contiguous axial images were obtained from the base of the skull through the vertex without intravenous contrast.  RADIATION DOSE REDUCTION: This exam was performed according to the departmental dose-optimization program which includes automated exposure control, adjustment of the mA and/or kV according to patient size and/or use of iterative reconstruction technique. COMPARISON:  None Available. FINDINGS: Brain: No evidence of acute infarction, hemorrhage, hydrocephalus, extra-axial collection or mass lesion/mass effect.Moderate subcortical and periventricular hypodensity which is nonspecific but most likely represents the sequela chronic microvascular ischemia. Small chronic infarction left caudate head. Vascular: No hyperdense vessel or unexpected calcification. Skull: Normal. Negative for fracture or focal lesion. Sinuses/Orbits: No acute finding. Other: None. IMPRESSION: No acute intracranial hemorrhage or calvarial fracture. Electronically Signed   By: Prentice Spade M.D.   On: 05/08/2024 09:11   DG Chest Port 1 View Result Date: 05/08/2024 EXAM: 1 VIEW(S) XRAY OF THE CHEST 05/08/2024 08:30:00 AM COMPARISON: PA and lateral radiographs of the chest dated 02/04/2016. CLINICAL HISTORY: Trauma. FINDINGS: LUNGS AND PLEURA: Mild-to-moderate elevation of the right hemidiaphragm. No focal pulmonary opacity. No pleural effusion. No pneumothorax. HEART AND MEDIASTINUM: No acute abnormality of the cardiac and mediastinal silhouettes. BONES AND SOFT TISSUES: No acute osseous abnormality. IMPRESSION: 1. No acute process. 2. Mild-to-moderate elevation of the right hemidiaphragm. Electronically signed by: Evalene Coho MD 05/08/2024 08:35 AM EST RP  Workstation: [id number]   DG Pelvis Portable Result Date: 05/08/2024 EXAM: 1 or 2 view(s) Xray of the pelvis 05/08/2024 08:30:00 AM COMPARISON: None available. CLINICAL HISTORY: Trauma Trauma FINDINGS: BONES AND JOINTS: No acute fracture. No focal osseous lesion. No joint dislocation. SOFT TISSUES: The soft tissues are unremarkable. IMPRESSION: 1. No significant abnormality. Electronically signed by: Evalene Coho MD 05/08/2024 08:34 AM EST RP Workstation: [id number]    Review of Systems  Constitutional:  Positive for chills and malaise/fatigue.  HENT: Negative.    Eyes: Negative.   Respiratory: Negative.    Cardiovascular: Negative.   Gastrointestinal: Negative.   Genitourinary: Negative.   Musculoskeletal:  Positive for back pain and falls.  Skin: Negative.   Neurological: Negative.   Endo/Heme/Allergies: Negative.   Psychiatric/Behavioral: Negative.     Blood pressure 98/66, pulse 96, temperature (!) 97.5 F (36.4 C), resp. rate (!) 27, SpO2 94%. Estimated body mass index is 41.26 kg/m as calculated from the following:   Height as of 04/23/24: 4' 10 (1.473 m).   Weight as of 04/23/24: 89.5 kg.  Physical Exam Constitutional:      Appearance: She is ill-appearing.  HENT:     Nose: Nose normal.  Eyes:     Extraocular Movements: Extraocular movements intact.     Pupils: Pupils are equal, round, and reactive to light.  Cardiovascular:     Rate and Rhythm: Normal rate and regular rhythm.  Pulmonary:     Effort: Pulmonary effort is normal.  Abdominal:     Palpations: Abdomen is soft.  Musculoskeletal:        General: Normal range of motion.  Skin:    General: Skin is warm and dry.     Capillary Refill: Capillary refill takes less than 2 seconds.      Psychiatric:        Attention and Perception: Attention normal.        Mood and Affect: Mood normal.        Behavior: Behavior is cooperative.        Thought Content: Thought content normal.      Assessment/Plan: Jenny Yates has an acute kidney injury felt to be caused by severe dehydration. She is about two week status post lumbar fusion. She suffered  a fall earlier today, sustaining a possible L4 transverse process fracture. No interventions are needed. Patient is fine to mobilize with her lumbosacral corset.  I am in communication with my attending and they agree with the plan for this patient.   Gerard Beck, DNP, AGNP-C Nurse Practitioner  [facility] [address], [address], [place], [place] [address] P: [phone]    F: [phone]  05/08/2024, 11:25 AM

## 2024-05-08 NOTE — Progress Notes (Signed)
 Orthopedic Tech Progress Note Patient Details:  Jenny Yates 05/24/1961 [id number] Level 1 trauma. Not needed Patient ID: Jenny Yates, female   DOB: 1961-02-23, 63 y.o.   MRN: [id number]  [name] Cos 05/08/2024, 8:32 AM

## 2024-05-08 NOTE — ED Provider Notes (Signed)
 [place] EMERGENCY DEPARTMENT AT [facility] Provider Note   CSN: [id number] Arrival date & time: 05/08/24  9262     Patient presents with: Fall and Back Pain   Jenny Yates is a 63 y.o. female.  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  Patient recently underwent L4-5 fusion with Dr. Mavis here on October 29.  She has been recovering at home since the time of her discharge under the care of her husband who is a retired engineer, civil (consulting).  Decreased p.o. intake at home and severe generalized weakness.  Clemens out of bed today.  No head trauma.  Too weak to stand thereafter.  Reporting lower back pain.  No anticoagulation.  No fevers chills or respiratory symptoms.    Fall  Back Pain      Prior to Admission medications   Medication Sig Start Date End Date Taking? Authorizing Provider  ALPRAZolam  (XANAX ) 0.5 MG tablet Take 1 tablet (0.5 mg total) by mouth 3 (three) times daily as needed. 12/25/23   Fleeta Valeria Mayo, MD  budesonide  (ENTOCORT EC ) 3 MG 24 hr capsule TAKE 3 CAPSULES BY MOUTH EVERY DAY 08/13/23   Fleeta Valeria, Mayo, MD  Cholecalciferol (VITAMIN D3 PO) Take 2,000 Units by mouth at bedtime.    [provider]  cyanocobalamin (VITAMIN B12) 1000 MCG tablet Take 1,000 mcg by mouth in the morning.    [provider]  cyclobenzaprine (FLEXERIL) 5 MG tablet Take 1 tablet (5 mg total) by mouth 3 (three) times daily as needed for muscle spasms. 04/24/24   Tomlinson, Sara Caylin, PA-C  cyclobenzaprine (FLEXERIL) 5 MG tablet Take 1 tablet (5 mg total) by mouth every 8 (eight) hours as needed for muscle spasms. 04/29/24     docusate sodium (COLACE) 100 MG capsule Take 1 capsule (100 mg total) by mouth 2 (two) times daily. 04/24/24   Tomlinson, Sara Caylin, PA-C  fluticasone  (FLONASE ) 50 MCG/ACT nasal spray Place 1 spray into both nostrils daily. Patient taking differently: Place 1 spray into both nostrils  daily as needed for allergies. 02/04/24 02/03/25  Fleeta Valeria Mayo, MD  hydrALAZINE  (APRESOLINE ) 50 MG tablet Take 1 tablet (50 mg total) by mouth in the morning and at bedtime. TAKE 1 TABLET 2 TIMES DAILYWITH FOOD 02/04/24   Fleeta Valeria, Mayo, MD  hydrochlorothiazide  (HYDRODIURIL ) 25 MG tablet TAKE 1 TABLET DAILY 06/25/23   Fleeta Valeria, Mayo, MD  levothyroxine  (SYNTHROID ) 100 MCG tablet Take 1 tablet by mouth once daily 05/05/24   Van Eyk, Jason, MD  Multiple Vitamin (MULTIVITAMIN WITH MINERALS) TABS tablet Take 1 tablet by mouth in the morning.    [provider]  nebivolol  (BYSTOLIC ) 10 MG tablet Take 2 tablets (20 mg total) by mouth daily. Patient taking differently: Take 20 mg by mouth at bedtime. 03/20/24   Fleeta Valeria Mayo, MD  olmesartan  (BENICAR ) 20 MG tablet Take 1 tablet (20 mg total) by mouth in the morning and at bedtime. 08/24/23   Fleeta Valeria Mayo, MD  oxyCODONE-acetaminophen  (PERCOCET/ROXICET) 5-325 MG tablet Take 1-2 tablets by mouth every 4 (four) hours as needed for pain. 04/28/24     oxyCODONE-acetaminophen  (PERCOCET/ROXICET) 5-325 MG tablet Take 1-2 tablets by mouth every 6 (six) hours as needed for pain 05/07/24     rosuvastatin  (CRESTOR ) 40 MG tablet Take 1 tablet (40 mg total) by mouth daily. Patient taking differently: Take 40 mg by mouth at bedtime. 03/20/24  Fleeta Valeria Mayo, MD    Allergies: Codeine, Epinephrine, Azithromycin, and Iodine    Review of Systems  Musculoskeletal:  Positive for back pain.    Updated Vital Signs BP 101/71   Pulse 96   Temp (!) 97.5 F (36.4 C)   Resp 20   SpO2 92%   Physical Exam Vitals and nursing note reviewed.  HENT:     Head: Normocephalic and atraumatic.  Eyes:     Pupils: Pupils are equal, round, and reactive to light.  Cardiovascular:     Rate and Rhythm: Normal rate and regular rhythm.  Pulmonary:     Effort: Pulmonary effort is normal.     Breath sounds: Normal breath sounds.  Abdominal:     Palpations: Abdomen is soft.      Tenderness: There is no abdominal tenderness.  Musculoskeletal:     Cervical back: Neck supple. No tenderness.     Comments: Healing midline incision over lower lumbar region with sutures in place No surrounding erythema induration or drainage Thoracic and lower lumbar midline tenderness without step-off deformity 5 out of 5 motor strength bilateral upper and lower extremities Sensation intact light touch throughout Skin tear over right elbow  Skin:    General: Skin is warm and dry.  Neurological:     Mental Status: She is alert.  Psychiatric:        Mood and Affect: Mood normal.     (all labs ordered are listed, but only abnormal results are displayed) Labs Reviewed  COMPREHENSIVE METABOLIC PANEL WITH GFR - Abnormal; Notable for the following components:      Result Value   Sodium 122 (*)    Chloride 87 (*)    Glucose, Bld 128 (*)    BUN 29 (*)    Creatinine, Ser 1.52 (*)    Total Protein 5.3 (*)    Albumin 2.5 (*)    GFR, Estimated 38 (*)    All other components within normal limits  CBC WITH DIFFERENTIAL/PLATELET - Abnormal; Notable for the following components:   WBC 11.5 (*)    RBC 3.42 (*)    Hemoglobin 11.0 (*)    HCT 32.3 (*)    Neutro Abs 9.1 (*)    Monocytes Absolute 1.2 (*)    Abs Immature Granulocytes 0.11 (*)    All other components within normal limits  I-STAT CHEM 8, ED - Abnormal; Notable for the following components:   Sodium 123 (*)    Chloride 86 (*)    BUN 33 (*)    Creatinine, Ser 1.60 (*)    Glucose, Bld 124 (*)    Hemoglobin 10.9 (*)    HCT 32.0 (*)    All other components within normal limits  PROTIME-INR  URINALYSIS, ROUTINE W REFLEX MICROSCOPIC  I-STAT CG4 LACTIC ACID, ED  TYPE AND SCREEN  SAMPLE TO BLOOD BANK    EKG: None  Radiology: CT T-SPINE NO CHARGE Result Date: 05/08/2024 CLINICAL DATA:  63 year old with fall this morning and complaint of back pain. Had back surgery 2 weeks ago per husband, per patient she had back  surgery 15 years ago, arrives with back brace EXAM: CT THORACIC SPINE WITHOUT CONTRAST; CT LUMBAR SPINE WITHOUT CONTRAST TECHNIQUE: Multidetector CT images of the thoracic and lumbar spine were obtained using the standard protocol without intravenous contrast. RADIATION DOSE REDUCTION: This exam was performed according to the departmental dose-optimization program which includes automated exposure control, adjustment of the mA and/or kV according to patient size and/or  use of iterative reconstruction technique. COMPARISON:  MRI lumbar spine 03/23/2024, lumbar spine radiographs 02/04/2024 and 01/25/2024 FINDINGS: There are postsurgical changes relating to L4-L5 posterior spinal fusion with transpedicular screws, lateral rods and interbody device. There is 5 mm grade 1 anterolisthesis of L4 on L5, post fusion, similar to preoperative exam. The orthopedic hardware appears intact. The left L5 transpedicular screw tip extends slightly beyond the anterior L5 vertebral body cortex. Alignment: There is otherwise no significant listhesis. Vertebrae: There is suggestion of nondisplaced fracture of the left L4 transverse process (series 3, image 57 and series 5, image 39) with possible extension into the facet adjacent to the transpedicular screw (series 3, image 57). Otherwise no acute fracture or focal pathologic process. Again limbus L4 vertebra. Paraspinal and other soft tissues: Ill-defined right apical pulmonary ground-glass opacity, please see separately dictated report for CT chest, abdomen, and pelvis for further relevant evaluation. There is multilevel degenerative intervertebral disc height loss. IMPRESSION: Suspicion for nondisplaced fracture of the left L4 transverse process with possible extension adjacent to the transpedicular screw. Otherwise no acute fracture or traumatic malalignment of the thoracic or lumbar spine. Postsurgical changes relating to L4-L5 posterior spinal fusion. The orthopedic hardware  appears intact. Electronically Signed   By: Prentice Spade M.D.   On: 05/08/2024 10:29   CT L-SPINE NO CHARGE Result Date: 05/08/2024 CLINICAL DATA:  63 year old with fall this morning and complaint of back pain. Had back surgery 2 weeks ago per husband, per patient she had back surgery 15 years ago, arrives with back brace EXAM: CT THORACIC SPINE WITHOUT CONTRAST; CT LUMBAR SPINE WITHOUT CONTRAST TECHNIQUE: Multidetector CT images of the thoracic and lumbar spine were obtained using the standard protocol without intravenous contrast. RADIATION DOSE REDUCTION: This exam was performed according to the departmental dose-optimization program which includes automated exposure control, adjustment of the mA and/or kV according to patient size and/or use of iterative reconstruction technique. COMPARISON:  MRI lumbar spine 03/23/2024, lumbar spine radiographs 02/04/2024 and 01/25/2024 FINDINGS: There are postsurgical changes relating to L4-L5 posterior spinal fusion with transpedicular screws, lateral rods and interbody device. There is 5 mm grade 1 anterolisthesis of L4 on L5, post fusion, similar to preoperative exam. The orthopedic hardware appears intact. The left L5 transpedicular screw tip extends slightly beyond the anterior L5 vertebral body cortex. Alignment: There is otherwise no significant listhesis. Vertebrae: There is suggestion of nondisplaced fracture of the left L4 transverse process (series 3, image 57 and series 5, image 39) with possible extension into the facet adjacent to the transpedicular screw (series 3, image 57). Otherwise no acute fracture or focal pathologic process. Again limbus L4 vertebra. Paraspinal and other soft tissues: Ill-defined right apical pulmonary ground-glass opacity, please see separately dictated report for CT chest, abdomen, and pelvis for further relevant evaluation. There is multilevel degenerative intervertebral disc height loss. IMPRESSION: Suspicion for nondisplaced  fracture of the left L4 transverse process with possible extension adjacent to the transpedicular screw. Otherwise no acute fracture or traumatic malalignment of the thoracic or lumbar spine. Postsurgical changes relating to L4-L5 posterior spinal fusion. The orthopedic hardware appears intact. Electronically Signed   By: Prentice Spade M.D.   On: 05/08/2024 10:29   CT CHEST ABDOMEN PELVIS WO CONTRAST Result Date: 05/08/2024 EXAM: CT CHEST, ABDOMEN AND PELVIS WITHOUT CONTRAST 05/08/2024 08:48:00 AM TECHNIQUE: CT of the chest, abdomen and pelvis was performed without the administration of intravenous contrast. Multiplanar reformatted images are provided for review. Automated exposure control, iterative reconstruction, and/or weight  based adjustment of the mA/kV was utilized to reduce the radiation dose to as low as reasonably achievable. COMPARISON: None available. CLINICAL HISTORY: Polytrauma, blunt. FINDINGS: CHEST: MEDIASTINUM AND LYMPH NODES: Heart and pericardium are unremarkable. The central airways are clear. No mediastinal, hilar or axillary lymphadenopathy. LUNGS AND PLEURA: Minimal bibasilar subsegmental atelectasis is noted. A 13 x 6 mm ground-glass opacity is seen in the right lung apex. Follow-up unenhanced chest CT in 3 months is recommended to ensure stability and rule out neoplasm. No focal consolidation or pulmonary edema. No pleural effusion or pneumothorax. ABDOMEN AND PELVIS: LIVER: The liver is unremarkable. GALLBLADDER AND BILE DUCTS: Status post cholecystectomy. No biliary ductal dilatation. SPLEEN: No acute abnormality. PANCREAS: No acute abnormality. ADRENAL GLANDS: No acute abnormality. KIDNEYS, URETERS AND BLADDER: No stones in the kidneys or ureters. No hydronephrosis. No perinephric or periureteral stranding. Urinary bladder is unremarkable. GI AND BOWEL: Stomach demonstrates no acute abnormality. There is no bowel obstruction. REPRODUCTIVE ORGANS: No acute abnormality. PERITONEUM  AND RETROPERITONEUM: No ascites. No free air. VASCULATURE: Aorta is normal in caliber. ABDOMINAL AND PELVIS LYMPH NODES: No lymphadenopathy. REPRODUCTIVE ORGANS: No acute abnormality. BONES AND SOFT TISSUES: No acute osseous abnormality. No focal soft tissue abnormality. IMPRESSION: 1. 13 x 6 mm ground-glass nodule in the right lung apex. Recommend chest CT at 612 months to confirm persistence; if persistent, then CT every 2 years until 5 years. If growth or solid component develops, consider resection. 2. No definite traumatic abnormality seen. Electronically signed by: Lynwood Seip MD 05/08/2024 09:38 AM EST RP Workstation: [id number]   CT CERVICAL SPINE WO CONTRAST Result Date: 05/08/2024 CLINICAL DATA:  63 year old with blunt poly trauma EXAM: CT CERVICAL SPINE WITHOUT CONTRAST TECHNIQUE: Multidetector CT imaging of the cervical spine was performed without intravenous contrast. Multiplanar CT image reconstructions were also generated. RADIATION DOSE REDUCTION: This exam was performed according to the departmental dose-optimization program which includes automated exposure control, adjustment of the mA and/or kV according to patient size and/or use of iterative reconstruction technique. COMPARISON:  None Available. FINDINGS: Alignment: There is overall straightening of the normal cervical lordosis with multilevel trace, likely degenerative, anterolisthesis C2 on C3, C3 on C4, and C4 on C5 without evidence of acute or traumatic vertebral column listhesis. Skull base and vertebrae: No acute fracture. No primary bone lesion or focal pathologic process. Soft tissues and spinal canal: Limited evaluation of the spinal canal secondary to photon starvation artifact. Within this limitation, no high-grade canal stenosis is seen. Unremarkable extra-spinal soft tissues. Disc levels: There is multilevel uncovertebral spurring and facet arthropathy with up to moderate/ severe neural foraminal narrowing, generally worse on  the left. Upper chest: See dedicated chest imaging. Other: None. IMPRESSION: No acute fracture or traumatic malalignment of the cervical spine. Electronically Signed   By: Prentice Spade M.D.   On: 05/08/2024 09:26   CT HEAD WO CONTRAST Result Date: 05/08/2024 CLINICAL DATA:  63 year old with moderate to severe head trauma EXAM: CT HEAD WITHOUT CONTRAST TECHNIQUE: Contiguous axial images were obtained from the base of the skull through the vertex without intravenous contrast. RADIATION DOSE REDUCTION: This exam was performed according to the departmental dose-optimization program which includes automated exposure control, adjustment of the mA and/or kV according to patient size and/or use of iterative reconstruction technique. COMPARISON:  None Available. FINDINGS: Brain: No evidence of acute infarction, hemorrhage, hydrocephalus, extra-axial collection or mass lesion/mass effect.Moderate subcortical and periventricular hypodensity which is nonspecific but most likely represents the sequela chronic microvascular ischemia. Small chronic  infarction left caudate head. Vascular: No hyperdense vessel or unexpected calcification. Skull: Normal. Negative for fracture or focal lesion. Sinuses/Orbits: No acute finding. Other: None. IMPRESSION: No acute intracranial hemorrhage or calvarial fracture. Electronically Signed   By: Prentice Spade M.D.   On: 05/08/2024 09:11   DG Chest Port 1 View Result Date: 05/08/2024 EXAM: 1 VIEW(S) XRAY OF THE CHEST 05/08/2024 08:30:00 AM COMPARISON: PA and lateral radiographs of the chest dated 02/04/2016. CLINICAL HISTORY: Trauma. FINDINGS: LUNGS AND PLEURA: Mild-to-moderate elevation of the right hemidiaphragm. No focal pulmonary opacity. No pleural effusion. No pneumothorax. HEART AND MEDIASTINUM: No acute abnormality of the cardiac and mediastinal silhouettes. BONES AND SOFT TISSUES: No acute osseous abnormality. IMPRESSION: 1. No acute process. 2. Mild-to-moderate elevation of  the right hemidiaphragm. Electronically signed by: Evalene Coho MD 05/08/2024 08:35 AM EST RP Workstation: [id number]   DG Pelvis Portable Result Date: 05/08/2024 EXAM: 1 or 2 view(s) Xray of the pelvis 05/08/2024 08:30:00 AM COMPARISON: None available. CLINICAL HISTORY: Trauma Trauma FINDINGS: BONES AND JOINTS: No acute fracture. No focal osseous lesion. No joint dislocation. SOFT TISSUES: The soft tissues are unremarkable. IMPRESSION: 1. No significant abnormality. Electronically signed by: Evalene Coho MD 05/08/2024 08:34 AM EST RP Workstation: [id number]     Ultrasound ED FAST  Date/Time: 05/08/2024 11:35 AM  Performed by: Pamella Ozell LABOR, DO Authorized by: Pamella Ozell LABOR, DO    Comments:     EMERGENCY DEPARTMENT US  FAST EXAM Limited Ultrasound of the Abdomen and Pericardium (eFAST Exam).   INDICATIONS :Blunt injury of abdomen and/or chest Multiple views of the abdomen and pericardium are obtained with a multi-frequency probe.  PERFORMED BY: Myself IMAGES ARCHIVED?: Yes LIMITATIONS: None  Findings: A complete extended Focused Assessment with Sonography for Trauma (eFAST) was performed, including the following views: Right upper-quadrant abdominal Left upper-quadrant abdominal Suprapubic (transverse and sagittal) Subxiphoid cardiac Bilateral anterior thoracic (lung/pneumothorax assessment)  All views were adequately visualized. No evidence of intraperitoneal, pericardial, or pleural free fluid was identified. No sonographic evidence of pneumothorax was present. No abnormal cardiac findings were noted.  Impression: Negative eFAST examination: No sonographic evidence of intra-abdominal, intrathoracic, or pericardial free fluid, and no pneumothorax detected.   .Critical Care  Performed by: Pamella Ozell LABOR, DO Authorized by: Pamella Ozell LABOR, DO   Critical care provider statement:    Critical care time (minutes):  80   Critical care was necessary to treat or  prevent imminent or life-threatening deterioration of the following conditions:  Trauma and shock   Critical care was time spent personally by me on the following activities:  Development of treatment plan with patient or surrogate, discussions with consultants, evaluation of patient's response to treatment, examination of patient, ordering and review of laboratory studies, ordering and review of radiographic studies, ordering and performing treatments and interventions, pulse oximetry, re-evaluation of patient's condition, review of old charts and obtaining history from patient or surrogate   I assumed direction of critical care for this patient from another provider in my specialty: no     Care discussed with: admitting provider   Comments:     Discussed care with trauma team Dr. Sebastian and admitting provider Dr.Ghimire    Medications Ordered in the ED  sodium chloride 0.9 % bolus 1,000 mL (0 mLs Intravenous Stopped 05/08/24 0908)  Medical Decision Making 63 year old female with history as above recently having undergone L4-5 fusion on October 29 here with Dr. Mavis presenting after fall out of bed.  Poor p.o. intake at home severely weak.  No focal neurologic deficits.  No severe evidence of trauma on my initial assessment and primary exam was intact however she was activated as a level 1 trauma due to fall with severe hypotension.  Examined with Dr. Sebastian (trauma).  CT head C-spine chest abdomen pelvis thoracic spine without acute traumatic injury.  Lumbar spine CT shows nondisplaced transverse proximal is fracture.  I did let her neurosurgery team know and they will write a brief consult note but nothing to do from their end.  Regarding dehydration labs notable for AKI and significant hyponatremia.  Likely in the setting of prerenal hypovolemia.  Pressure has improved with volume resuscitation 2 L.  Discussed with admitting hospitalist who accept  patient for admission.  Patient remained stable at this time  Amount and/or Complexity of Data Reviewed Labs: ordered. Radiology: ordered.  Risk Decision regarding hospitalization.        Final diagnoses:  Fall in home, initial encounter  Closed fracture of transverse process of lumbar vertebra, initial encounter [facility])  History of lumbar fusion  Hyponatremia  Dehydration    ED Discharge Orders     None          Pamella Ozell LABOR, DO 05/08/24 1141

## 2024-05-08 NOTE — ED Notes (Signed)
 Pt called husband whos otw here.  Bed changed and purewick repositioned and set to 60-80 mmHg suction.  Lights dimmed. Checking pts BP which read low earlier. Same now noted to be 161/118

## 2024-05-08 NOTE — ED Triage Notes (Signed)
 Pt bib [facility] from home after falling out of bed this morning and c/o of back pain. Had back surgery 2 weeks ago per husband, per pt she had back surgery 15 years ago, arrives with back brace. Pt a&ox4. No LOC, no thinners, skin tear right elbow, abrasion left knee. Pt reports weakness. VSS

## 2024-05-08 NOTE — ED Notes (Signed)
 Trauma Response Nurse Documentation  Jenny Yates is a 63 y.o. female arriving to [facility] ED via EMS  On No antithrombotic. Trauma was activated as a Level 2 by MD Pamella, however was upgraded on my arrival due to blood pressures maintaining in the 70s.  Patient cleared for CT by Dr. Sebastian. Pt transported to CT with trauma response nurse present to monitor. RN remained with the patient throughout their absence from the department for clinical observation. GCS 14.  Trauma MD Arrival Time: see documentation.  History   Past Medical History:  Diagnosis Date   Anemia    Anxiety    Collagenous colitis    Depression    Hyperlipidemia    Hypertension    Hypothyroidism    Lumbar herniated disc    Panic disorder      Past Surgical History:  Procedure Laterality Date   BACK SURGERY     2007   HERNIA REPAIR     TUBAL LIGATION       Initial Focused Assessment (If applicable, or please see trauma documentation): Patient alert and oriented, confused. PERR 3. Airway intact, bilateral breath sounds Pulses 1+  CT's Completed:   CT Head, CT C-Spine, CT Chest w/ contrast, and CT abdomen/pelvis w/ contrast   Interventions:  IV, labs CXR/PXR 1L NS Bolus CT Head/Cspine/C/A/P  Plan for disposition:  Admission to floor   Consults completed:  Neurosurgeon at see charting, consulted due to recent surgery with Dr Mavis.  Event Summary: Patient to ED after a fall from bed. Per patients husband patient had a recent back surgery and has had poor PO intake since returning home, very weak. Patient fell while getting out of bed this morning. Back brace in place on arrival, per patient is experiencing acute back pain. Blood pressures sustained systolic in 60s-70s. Due to mechanism and hypotension, patient was upgraded to a level 1 and Dr Sebastian was notified. Imaging was performed and revealed a possible fx at L4 TP. Plan for medical admission with neurosurgery consult.  Bedside handoff  with ED RN Andriette.    Alan CROME Jenny Yates  Trauma Response RN  Please call TRN at [phone] for further assistance.

## 2024-05-08 NOTE — H&P (Addendum)
 History and Physical    Patient: Jenny Yates FMW:[id number] DOB: 09-03-1960 DOA: 05/08/2024 DOS: the patient was seen and examined on 05/08/2024 PCP: Fleeta Valeria Mayo, MD  Patient coming from: Home  Chief Complaint:  Chief Complaint  Patient presents with   Fall   Back Pain   HPI: LANDRY LOOKINGBILL is a 63 y.o. female with medical history significant of with history of HTN, anxiety/depression, chronic pain-s/p L4-L5 fusion on 10/29-who presented to the hospital following a mechanical fall-was found to have hypotension/AKI/hyponatremia in setting of dehydration.  During my evaluation-no family at bedside-patient is a poor historian but acknowledges that since recent back surgery-she has had significant issues with poor appetite-and has only been eating 4-5 bites on a daily basis.  She has tried to keep herself hydrated by drinking some fluids.  She also has had difficulty mobilizing and has had significant generalized weakness.  She attempted to get out of bed today and fell-no history of syncope/LOC.  She reportedly landed on her knees and never hit her head.  She was brought to the ED-where she was found to have hypotension-workup showed AKI and hyponatremia.  She was evaluated by trauma service-and cleared from their perspective.  Her blood pressure was soft-and received 2 L of IV fluid bolus before the hospitalist service even was consulted for admission.  Patient denies any recent fever, headache No history of nausea, vomiting or diarrhea No abdominal pain No chest pain or shortness of breath No hematuria/dysuria.   Review of Systems: As mentioned in the history of present illness. All other systems reviewed and are negative. Past Medical History:  Diagnosis Date   Anemia    Anxiety    Collagenous colitis    Depression    Hyperlipidemia    Hypertension    Hypothyroidism    Lumbar herniated disc    Panic disorder    Past Surgical History:  Procedure Laterality Date   BACK  SURGERY     2007   HERNIA REPAIR     TUBAL LIGATION     Social History:  reports that she has quit smoking. Her smoking use included cigarettes. She has never used smokeless tobacco. She reports current alcohol use. She reports that she does not use drugs.  Allergies  Allergen Reactions   Codeine Other (See Comments)    Syncope knocks me to the floor   Epinephrine Other (See Comments)    Oral seizure-like activity   Azithromycin Rash   Iodine Swelling and Rash    No family history on file.  Prior to Admission medications   Medication Sig Start Date End Date Taking? Authorizing Provider  ALPRAZolam  (XANAX ) 0.5 MG tablet Take 1 tablet (0.5 mg total) by mouth 3 (three) times daily as needed. 12/25/23   Fleeta Valeria Mayo, MD  budesonide  (ENTOCORT EC ) 3 MG 24 hr capsule TAKE 3 CAPSULES BY MOUTH EVERY DAY 08/13/23   Fleeta Valeria, Mayo, MD  Cholecalciferol (VITAMIN D3 PO) Take 2,000 Units by mouth at bedtime.    [provider]  cyanocobalamin (VITAMIN B12) 1000 MCG tablet Take 1,000 mcg by mouth in the morning.    [provider]  cyclobenzaprine (FLEXERIL) 5 MG tablet Take 1 tablet (5 mg total) by mouth 3 (three) times daily as needed for muscle spasms. 04/24/24   Tomlinson, Sara Caylin, PA-C  cyclobenzaprine (FLEXERIL) 5 MG tablet Take 1 tablet (5 mg total) by mouth every 8 (eight) hours as needed for muscle spasms. 04/29/24  docusate sodium (COLACE) 100 MG capsule Take 1 capsule (100 mg total) by mouth 2 (two) times daily. 04/24/24   Tomlinson, Sara Caylin, PA-C  fluticasone  (FLONASE ) 50 MCG/ACT nasal spray Place 1 spray into both nostrils daily. Patient taking differently: Place 1 spray into both nostrils daily as needed for allergies. 02/04/24 02/03/25  Fleeta Valeria Mayo, MD  hydrALAZINE  (APRESOLINE ) 50 MG tablet Take 1 tablet (50 mg total) by mouth in the morning and at bedtime. TAKE 1 TABLET 2 TIMES DAILYWITH FOOD 02/04/24   Fleeta Valeria, Mayo, MD  hydrochlorothiazide   (HYDRODIURIL ) 25 MG tablet TAKE 1 TABLET DAILY 06/25/23   Fleeta Valeria, Mayo, MD  levothyroxine  (SYNTHROID ) 100 MCG tablet Take 1 tablet by mouth once daily 05/05/24   Van Eyk, Jason, MD  Multiple Vitamin (MULTIVITAMIN WITH MINERALS) TABS tablet Take 1 tablet by mouth in the morning.    [provider]  nebivolol  (BYSTOLIC ) 10 MG tablet Take 2 tablets (20 mg total) by mouth daily. Patient taking differently: Take 20 mg by mouth at bedtime. 03/20/24   Fleeta Valeria Mayo, MD  olmesartan  (BENICAR ) 20 MG tablet Take 1 tablet (20 mg total) by mouth in the morning and at bedtime. 08/24/23   Fleeta Valeria Mayo, MD  oxyCODONE-acetaminophen  (PERCOCET/ROXICET) 5-325 MG tablet Take 1-2 tablets by mouth every 4 (four) hours as needed for pain. 04/28/24     oxyCODONE-acetaminophen  (PERCOCET/ROXICET) 5-325 MG tablet Take 1-2 tablets by mouth every 6 (six) hours as needed for pain 05/07/24     rosuvastatin  (CRESTOR ) 40 MG tablet Take 1 tablet (40 mg total) by mouth daily. Patient taking differently: Take 40 mg by mouth at bedtime. 03/20/24   Fleeta Valeria Mayo, MD    Physical Exam: Vitals:   05/08/24 1100 05/08/24 1149 05/08/24 1156 05/08/24 1157  BP: 101/71 (!) 88/56    Pulse: 96 95    Resp: 20 20    Temp:  98.6 F (37 C)    TempSrc:  Oral    SpO2: 92% 95%    Weight:   90 kg 90 kg  Height:    4' 10 (1.473 m)   Gen Exam:Alert awake-not in any distress.  Bilateral cheek area (history of rosacea) HEENT:atraumatic, normocephalic Chest: B/L clear to auscultation anteriorly CVS:S1S2 regular Abdomen:soft non tender, non distended Extremities:+ Edema in lower legs-right leg more erythematous compared to left leg (per patient this is secondary to her rosacea) Neurology: Non focal-but with generalized weakness Skin: no rash  Data Reviewed:     Latest Ref Rng & Units 05/08/2024    8:27 AM 05/08/2024    8:20 AM 04/21/2024   12:00 PM  CBC  WBC 4.0 - 10.5 K/uL  11.5  8.8   Hemoglobin 12.0 - 15.0 g/dL 89.0  88.9   86.2   Hematocrit 36.0 - 46.0 % 32.0  32.3  42.3   Platelets 150 - 400 K/uL  270  210        Latest Ref Rng & Units 05/08/2024    8:27 AM 05/08/2024    8:20 AM 04/21/2024   12:00 PM  CMP  Glucose 70 - 99 mg/dL 875  871  880   BUN 8 - 23 mg/dL 33  29  15   Creatinine 0.44 - 1.00 mg/dL 8.39  8.47  9.21   Sodium 135 - 145 mmol/L 123  122  138   Potassium 3.5 - 5.1 mmol/L 4.2  4.1  4.5   Chloride 98 - 111 mmol/L 86  87  98   CO2 22 - 32 mmol/L  23  29   Calcium  8.9 - 10.3 mg/dL  9.8  89.9   Total Protein 6.5 - 8.1 g/dL  5.3    Total Bilirubin 0.0 - 1.2 mg/dL  0.5    Alkaline Phos 38 - 126 U/L  96    AST 15 - 41 U/L  39    ALT 0 - 44 U/L  22       Assessment and Plan: Mechanical fall Likely in the setting of hypotension due to dehydration Mobilize with PT/OT when able-per spouse-when she fell-she was unable to stand up.  AKI Likely hemodynamically mediated-in the setting of poor oral intake-HCTZ/ARB use UA negative for proteinuria If no improvement-Will initiate further workup/consult nephrology Avoid nephrotoxic agents  Hyponatremia Likely secondary to dehydration-poor oral intake/hydrochlorothiazide  use-Ensure also has been drinking mostly fluids. Has been given 2 L of IV fluid bolus in the emergency room-hold further IV fluids-recheck sodium in slowly attempt to correct hyponatremia.  Hypotension Suspect this is secondary dehydration/ongoing use of multiple antihypertensives BP slowly improving-after IVF-add midodrine Monitor in progressive care unit Hold all antihypertensives  HTN See above  Recent L4/L5 fusion S/p fall-CT imaging suspicious for nondisplaced fracture of left L4 transverse process Moving both legs-sensation grossly intact ED MD has consulted neurosurgery-await further recommendations Per spouse-able to ambulate around 50 feet with the help of a walker.  Hypothyroidism Synthroid   Vitamin B12 deficiency Continue supplementation  Anxiety  disorder/mood disorder As needed Xanax   Collagenous colitis No diarrhea Continue budesonide    Advance Care Planning:   Code Status: Full Code   Consults: Neurosurgery  Family Communication: Spouse-[phone] updated 11/13  Severity of Illness: The appropriate patient status for this patient is INPATIENT. Inpatient status is judged to be reasonable and necessary in order to provide the required intensity of service to ensure the patient's safety. The patient's presenting symptoms, physical exam findings, and initial radiographic and laboratory data in the context of their chronic comorbidities is felt to place them at high risk for further clinical deterioration. Furthermore, it is not anticipated that the patient will be medically stable for discharge from the hospital within 2 midnights of admission.   * I certify that at the point of admission it is my clinical judgment that the patient will require inpatient hospital care spanning beyond 2 midnights from the point of admission due to high intensity of service, high risk for further deterioration and high frequency of surveillance required.*  Author: Donalda Applebaum, MD 05/08/2024 12:37 PM  For on call review [email or web address].

## 2024-05-08 NOTE — Consult Note (Signed)
 Reason for Consult:fall out of bed Referring Physician: [name] is an 63 y.o. female.  HPI: 63yo F with recent back surgery by Dr. Mavis fell out of bed this morning.  She was brought in as a level 2 trauma.  Systolic blood pressure was low and she was upgraded to a level 1 trauma.  On my arrival she denies complaints except being cold.  She was given IV fluid boluses.  Past Medical History:  Diagnosis Date   Anemia    Anxiety    Collagenous colitis    Depression    Hyperlipidemia    Hypertension    Hypothyroidism    Lumbar herniated disc    Panic disorder     Past Surgical History:  Procedure Laterality Date   BACK SURGERY     2007   HERNIA REPAIR     TUBAL LIGATION      No family history on file.  Social History:  reports that she has quit smoking. Her smoking use included cigarettes. She has never used smokeless tobacco. She reports current alcohol use. She reports that she does not use drugs.  Allergies:  Allergies  Allergen Reactions   Codeine Other (See Comments)    Syncope knocks me to the floor   Epinephrine Other (See Comments)    Oral seizure-like activity   Azithromycin Rash   Iodine Swelling and Rash    Medications: I have reviewed the patient's current medications.  Results for orders placed or performed during the hospital encounter of 05/08/24 (from the past 48 hours)  Sample to Blood Bank     Status: None   Collection Time: 05/08/24  8:05 AM  Result Value Ref Range   Blood Bank Specimen SAMPLE AVAILABLE FOR TESTING    Sample Expiration      05/11/2024,2359 Performed at [facility], [address] [place], [place] [address]   Comprehensive metabolic panel     Status: Abnormal   Collection Time: 05/08/24  8:20 AM  Result Value Ref Range   Sodium 122 (L) 135 - 145 mmol/L   Potassium 4.1 3.5 - 5.1 mmol/L   Chloride 87 (L) 98 - 111 mmol/L   CO2 23 22 - 32 mmol/L   Glucose, Bld 128 (H) 70 - 99 mg/dL    Comment:  Glucose reference range applies only to samples taken after fasting for at least 8 hours.   BUN 29 (H) 8 - 23 mg/dL   Creatinine, Ser 8.47 (H) 0.44 - 1.00 mg/dL   Calcium  9.8 8.9 - 10.3 mg/dL   Total Protein 5.3 (L) 6.5 - 8.1 g/dL   Albumin 2.5 (L) 3.5 - 5.0 g/dL   AST 39 15 - 41 U/L   ALT 22 0 - 44 U/L   Alkaline Phosphatase 96 38 - 126 U/L   Total Bilirubin 0.5 0.0 - 1.2 mg/dL   GFR, Estimated 38 (L) >60 mL/min    Comment: (NOTE) Calculated using the CKD-EPI Creatinine Equation (2021)    Anion gap 12 5 - 15    Comment: Performed at [facility], [address], [place], [place] [address]  CBC with Differential     Status: Abnormal   Collection Time: 05/08/24  8:20 AM  Result Value Ref Range   WBC 11.5 (H) 4.0 - 10.5 K/uL   RBC 3.42 (L) 3.87 - 5.11 MIL/uL   Hemoglobin 11.0 (L) 12.0 - 15.0 g/dL   HCT 67.6 (L) 63.9 - 53.9 %  MCV 94.4 80.0 - 100.0 fL   MCH 32.2 26.0 - 34.0 pg   MCHC 34.1 30.0 - 36.0 g/dL   RDW 86.9 88.4 - 84.4 %   Platelets 270 150 - 400 K/uL   nRBC 0.0 0.0 - 0.2 %   Neutrophils Relative % 79 %   Neutro Abs 9.1 (H) 1.7 - 7.7 K/uL   Lymphocytes Relative 9 %   Lymphs Abs 1.0 0.7 - 4.0 K/uL   Monocytes Relative 10 %   Monocytes Absolute 1.2 (H) 0.1 - 1.0 K/uL   Eosinophils Relative 0 %   Eosinophils Absolute 0.1 0.0 - 0.5 K/uL   Basophils Relative 1 %   Basophils Absolute 0.1 0.0 - 0.1 K/uL   Immature Granulocytes 1 %   Abs Immature Granulocytes 0.11 (H) 0.00 - 0.07 K/uL    Comment: Performed at [facility], [address], [place], [place] [address]  Type and screen [facility]     Status: None   Collection Time: 05/08/24  8:20 AM  Result Value Ref Range   ABO/RH(D) AB POS    Antibody Screen NEG    Sample Expiration      05/11/2024,2359 Performed at [facility], [address], [place], [place] [address]   Protime-INR     Status: None   Collection Time: 05/08/24  8:20 AM  Result Value Ref Range   Prothrombin Time  14.3 11.4 - 15.2 seconds   INR 1.1 0.8 - 1.2    Comment: (NOTE) INR goal varies based on device and disease states. Performed at [facility], [address] [place], [place] [address]   I-Stat Chem 8, ED     Status: Abnormal   Collection Time: 05/08/24  8:27 AM  Result Value Ref Range   Sodium 123 (L) 135 - 145 mmol/L   Potassium 4.2 3.5 - 5.1 mmol/L   Chloride 86 (L) 98 - 111 mmol/L   BUN 33 (H) 8 - 23 mg/dL   Creatinine, Ser 8.39 (H) 0.44 - 1.00 mg/dL   Glucose, Bld 875 (H) 70 - 99 mg/dL    Comment: Glucose reference range applies only to samples taken after fasting for at least 8 hours.   Calcium , Ion 1.25 1.15 - 1.40 mmol/L   TCO2 28 22 - 32 mmol/L   Hemoglobin 10.9 (L) 12.0 - 15.0 g/dL   HCT 67.9 (L) 63.9 - 53.9 %  I-Stat Lactic Acid, ED     Status: None   Collection Time: 05/08/24  8:28 AM  Result Value Ref Range   Lactic Acid, Venous 1.9 0.5 - 1.9 mmol/L    CT CHEST ABDOMEN PELVIS WO CONTRAST Result Date: 05/08/2024 EXAM: CT CHEST, ABDOMEN AND PELVIS WITHOUT CONTRAST 05/08/2024 08:48:00 AM TECHNIQUE: CT of the chest, abdomen and pelvis was performed without the administration of intravenous contrast. Multiplanar reformatted images are provided for review. Automated exposure control, iterative reconstruction, and/or weight based adjustment of the mA/kV was utilized to reduce the radiation dose to as low as reasonably achievable. COMPARISON: None available. CLINICAL HISTORY: Polytrauma, blunt. FINDINGS: CHEST: MEDIASTINUM AND LYMPH NODES: Heart and pericardium are unremarkable. The central airways are clear. No mediastinal, hilar or axillary lymphadenopathy. LUNGS AND PLEURA: Minimal bibasilar subsegmental atelectasis is noted. A 13 x 6 mm ground-glass opacity is seen in the right lung apex. Follow-up unenhanced chest CT in 3 months is recommended to ensure stability and rule out neoplasm. No focal consolidation or pulmonary edema. No  pleural effusion or pneumothorax.  ABDOMEN AND PELVIS: LIVER: The liver is unremarkable. GALLBLADDER AND BILE DUCTS: Status post cholecystectomy. No biliary ductal dilatation. SPLEEN: No acute abnormality. PANCREAS: No acute abnormality. ADRENAL GLANDS: No acute abnormality. KIDNEYS, URETERS AND BLADDER: No stones in the kidneys or ureters. No hydronephrosis. No perinephric or periureteral stranding. Urinary bladder is unremarkable. GI AND BOWEL: Stomach demonstrates no acute abnormality. There is no bowel obstruction. REPRODUCTIVE ORGANS: No acute abnormality. PERITONEUM AND RETROPERITONEUM: No ascites. No free air. VASCULATURE: Aorta is normal in caliber. ABDOMINAL AND PELVIS LYMPH NODES: No lymphadenopathy. REPRODUCTIVE ORGANS: No acute abnormality. BONES AND SOFT TISSUES: No acute osseous abnormality. No focal soft tissue abnormality. IMPRESSION: 1. 13 x 6 mm ground-glass nodule in the right lung apex. Recommend chest CT at 612 months to confirm persistence; if persistent, then CT every 2 years until 5 years. If growth or solid component develops, consider resection. 2. No definite traumatic abnormality seen. Electronically signed by: Lynwood Seip MD 05/08/2024 09:38 AM EST RP Workstation: [id number]   CT CERVICAL SPINE WO CONTRAST Result Date: 05/08/2024 CLINICAL DATA:  63 year old with blunt poly trauma EXAM: CT CERVICAL SPINE WITHOUT CONTRAST TECHNIQUE: Multidetector CT imaging of the cervical spine was performed without intravenous contrast. Multiplanar CT image reconstructions were also generated. RADIATION DOSE REDUCTION: This exam was performed according to the departmental dose-optimization program which includes automated exposure control, adjustment of the mA and/or kV according to patient size and/or use of iterative reconstruction technique. COMPARISON:  None Available. FINDINGS: Alignment: There is overall straightening of the normal cervical lordosis with multilevel trace, likely degenerative, anterolisthesis C2 on C3, C3 on C4,  and C4 on C5 without evidence of acute or traumatic vertebral column listhesis. Skull base and vertebrae: No acute fracture. No primary bone lesion or focal pathologic process. Soft tissues and spinal canal: Limited evaluation of the spinal canal secondary to photon starvation artifact. Within this limitation, no high-grade canal stenosis is seen. Unremarkable extra-spinal soft tissues. Disc levels: There is multilevel uncovertebral spurring and facet arthropathy with up to moderate/ severe neural foraminal narrowing, generally worse on the left. Upper chest: See dedicated chest imaging. Other: None. IMPRESSION: No acute fracture or traumatic malalignment of the cervical spine. Electronically Signed   By: Prentice Spade M.D.   On: 05/08/2024 09:26   CT HEAD WO CONTRAST Result Date: 05/08/2024 CLINICAL DATA:  63 year old with moderate to severe head trauma EXAM: CT HEAD WITHOUT CONTRAST TECHNIQUE: Contiguous axial images were obtained from the base of the skull through the vertex without intravenous contrast. RADIATION DOSE REDUCTION: This exam was performed according to the departmental dose-optimization program which includes automated exposure control, adjustment of the mA and/or kV according to patient size and/or use of iterative reconstruction technique. COMPARISON:  None Available. FINDINGS: Brain: No evidence of acute infarction, hemorrhage, hydrocephalus, extra-axial collection or mass lesion/mass effect.Moderate subcortical and periventricular hypodensity which is nonspecific but most likely represents the sequela chronic microvascular ischemia. Small chronic infarction left caudate head. Vascular: No hyperdense vessel or unexpected calcification. Skull: Normal. Negative for fracture or focal lesion. Sinuses/Orbits: No acute finding. Other: None. IMPRESSION: No acute intracranial hemorrhage or calvarial fracture. Electronically Signed   By: Prentice Spade M.D.   On: 05/08/2024 09:11   DG Chest Port  1 View Result Date: 05/08/2024 EXAM: 1 VIEW(S) XRAY OF THE CHEST 05/08/2024 08:30:00 AM COMPARISON: PA and lateral radiographs of the chest dated 02/04/2016. CLINICAL HISTORY: Trauma. FINDINGS: LUNGS AND PLEURA: Mild-to-moderate elevation of the right hemidiaphragm. No focal  pulmonary opacity. No pleural effusion. No pneumothorax. HEART AND MEDIASTINUM: No acute abnormality of the cardiac and mediastinal silhouettes. BONES AND SOFT TISSUES: No acute osseous abnormality. IMPRESSION: 1. No acute process. 2. Mild-to-moderate elevation of the right hemidiaphragm. Electronically signed by: Evalene Coho MD 05/08/2024 08:35 AM EST RP Workstation: [id number]   DG Pelvis Portable Result Date: 05/08/2024 EXAM: 1 or 2 view(s) Xray of the pelvis 05/08/2024 08:30:00 AM COMPARISON: None available. CLINICAL HISTORY: Trauma Trauma FINDINGS: BONES AND JOINTS: No acute fracture. No focal osseous lesion. No joint dislocation. SOFT TISSUES: The soft tissues are unremarkable. IMPRESSION: 1. No significant abnormality. Electronically signed by: Evalene Coho MD 05/08/2024 08:34 AM EST RP Workstation: [id number]    Review of Systems  Unable to perform ROS: Acuity of condition   Blood pressure 91/61, pulse 95, temperature (!) 97.5 F (36.4 C), resp. rate (!) 21, SpO2 94%. Physical Exam Constitutional:      General: She is in acute distress.  HENT:     Head: Normocephalic.  Eyes:     Pupils: Pupils are equal, round, and reactive to light.  Cardiovascular:     Rate and Rhythm: Normal rate and regular rhythm.  Pulmonary:     Effort: Pulmonary effort is normal.     Breath sounds: Normal breath sounds.  Abdominal:     General: Abdomen is flat. There is no distension.     Palpations: Abdomen is soft.     Tenderness: There is no abdominal tenderness.  Musculoskeletal:     Comments: Chronic venous stasis changes right lower extremity worse than left lower extremity, no deformity or tenderness  Neurological:      Mental Status: She is alert and oriented to person, place, and time.     Comments: MAE, GCS 15  Psychiatric:     Comments: Anxious     Assessment/Plan: Fall out of bed Recent lumbar spine surgery Hypotension -likely due to dehydration  No injuries noted on CT head, cervical spine, chest, abdomen, and pelvis. Trauma will sign off and recommend medical treatment.  Jenny Yates 05/08/2024, 9:52 AM

## 2024-05-09 ENCOUNTER — Encounter (HOSPITAL_COMMUNITY): Payer: Self-pay | Admitting: Internal Medicine

## 2024-05-09 DIAGNOSIS — E871 Hypo-osmolality and hyponatremia: Secondary | ICD-10-CM

## 2024-05-09 DIAGNOSIS — I959 Hypotension, unspecified: Secondary | ICD-10-CM | POA: Diagnosis not present

## 2024-05-09 DIAGNOSIS — G9341 Metabolic encephalopathy: Secondary | ICD-10-CM

## 2024-05-09 DIAGNOSIS — E860 Dehydration: Secondary | ICD-10-CM | POA: Diagnosis not present

## 2024-05-09 DIAGNOSIS — N179 Acute kidney failure, unspecified: Secondary | ICD-10-CM | POA: Diagnosis not present

## 2024-05-09 LAB — COMPREHENSIVE METABOLIC PANEL WITH GFR
ALT: 20 U/L (ref 0–44)
AST: 39 U/L (ref 15–41)
Albumin: 2.3 g/dL — ABNORMAL LOW (ref 3.5–5.0)
Alkaline Phosphatase: 93 U/L (ref 38–126)
Anion gap: 13 (ref 5–15)
BUN: 14 mg/dL (ref 8–23)
CO2: 25 mmol/L (ref 22–32)
Calcium: 9.4 mg/dL (ref 8.9–10.3)
Chloride: 93 mmol/L — ABNORMAL LOW (ref 98–111)
Creatinine, Ser: 0.84 mg/dL (ref 0.44–1.00)
GFR, Estimated: >60 mL/min (ref >60)
Glucose, Bld: 104 mg/dL — ABNORMAL HIGH (ref 70–99)
Potassium: 3.4 mmol/L — ABNORMAL LOW (ref 3.5–5.1)
Sodium: 131 mmol/L — ABNORMAL LOW (ref 135–145)
Total Bilirubin: 0.8 mg/dL (ref 0.0–1.2)
Total Protein: 5.1 g/dL — ABNORMAL LOW (ref 6.5–8.1)

## 2024-05-09 LAB — CBC
HCT: 29.4 % — ABNORMAL LOW (ref 36.0–46.0)
Hemoglobin: 10.2 g/dL — ABNORMAL LOW (ref 12.0–15.0)
MCH: 32.2 pg (ref 26.0–34.0)
MCHC: 34.7 g/dL (ref 30.0–36.0)
MCV: 92.7 fL (ref 80.0–100.0)
Platelets: 284 K/uL (ref 150–400)
RBC: 3.17 MIL/uL — ABNORMAL LOW (ref 3.87–5.11)
RDW: 12.9 % (ref 11.5–15.5)
WBC: 6.7 K/uL (ref 4.0–10.5)
nRBC: 0 % (ref 0.0–0.2)

## 2024-05-09 LAB — MAGNESIUM: Magnesium: 1.4 mg/dL — ABNORMAL LOW (ref 1.7–2.4)

## 2024-05-09 LAB — HIV ANTIBODY (ROUTINE TESTING W REFLEX): HIV Screen 4th Generation wRfx: NONREACTIVE

## 2024-05-09 MED ORDER — BOOST PO LIQD
237.0000 mL | Freq: Every day | ORAL | Status: DC
  Administered 2024-05-10 – 2024-05-12 (×3): 237 mL via ORAL
  Filled 2024-05-09 (×6): qty 237

## 2024-05-09 MED ORDER — LACTATED RINGERS IV BOLUS
250.0000 mL | Freq: Once | INTRAVENOUS | Status: AC
  Administered 2024-05-09: 250 mL via INTRAVENOUS

## 2024-05-09 MED ORDER — CELECOXIB 200 MG PO CAPS
200.0000 mg | ORAL_CAPSULE | Freq: Every day | ORAL | Status: DC
  Administered 2024-05-09 – 2024-05-14 (×6): 200 mg via ORAL
  Filled 2024-05-09 (×6): qty 1

## 2024-05-09 MED ORDER — MAGNESIUM SULFATE 4 GM/100ML IV SOLN
4.0000 g | Freq: Once | INTRAVENOUS | Status: AC
  Administered 2024-05-09: 4 g via INTRAVENOUS
  Filled 2024-05-09: qty 100

## 2024-05-09 MED ORDER — OXYCODONE HCL 5 MG PO TABS: 5.0000 mg | ORAL_TABLET | Freq: Three times a day (TID) | ORAL | Status: DC | PRN

## 2024-05-09 MED ORDER — ORAL CARE MOUTH RINSE: 15.0000 mL | OROMUCOSAL | Status: DC | PRN

## 2024-05-09 MED ORDER — POTASSIUM CHLORIDE CRYS ER 20 MEQ PO TBCR
40.0000 meq | EXTENDED_RELEASE_TABLET | Freq: Once | ORAL | Status: AC
  Administered 2024-05-09: 40 meq via ORAL
  Filled 2024-05-09: qty 2

## 2024-05-09 NOTE — Progress Notes (Addendum)
 PROGRESS NOTE   NNENNA Yates  FMW:[id number]    DOB: 01/24/1961    DOA: 05/08/2024  PCP: Fleeta Valeria Mayo, MD   I have briefly reviewed patients previous medical records in [facility].   Brief Hospital Course:  63 year old female, lives with her spouse who is a retired Insurance Underwriter, medical history significant for HTN, anxiety/depression, chronic pain, s/p L4-L5 fusion on 04/23/2024 who presented to the ED following mechanical fall at home, confusion, noted to have hypotension, hyponatremia, acute kidney injury in the setting of dehydration.   Assessment & Plan:   Mechanical fall at home Physical deconditioning Recent spine surgery Apart from suspicious nondisplaced fracture of left L4 transverse process, extensive imaging below without acute findings. Likely multifactorial due to dehydration, hypotension, polypharmacy. OT has evaluated and recommend AIR.  PT input pending.  Consulted CIR screening team.  Dehydration with hyponatremia Secondary to poor oral intake at home, and HCTZ. Presented with serum sodium of 122. Post bolus IV fluids and brief maintenance IV fluids (stopped on hospital day 1), serum sodium has increased to 131 in about 24 hours.    Hypokalemia Replace and follow  Hypotension Not sure if all of this is due to volume depletion. Holding antihypertensives (PTA meds: Hydralazine  50 mg twice daily, HCTZ 25 mg daily, Bystolic  20 mg nightly, Benicar  20 mg twice daily) S/p 1 L IV fluid bolus followed by brief IV normal saline at 75 mL/h which were stopped yesterday. Continue midodrine 10 mg 3 times daily.  Check a.m. cortisol tomorrow. Clinically may still be a little dry, will give her 250 mL IVF bolus.  Acute metabolic encephalopathy Multifactorial due to polypharmacy, dehydration and AKI. CT head without acute findings. Delirium precautions.  Minimize opioids and sedatives.  On reduced dose Xanax  here.  Discontinued Flexeril.  Monitor.  Essential  hypertension Hypotensive now.  See above.  S/p L4-5 fusion 04/23/2024 Extensive CT T/L/C-spine as noted below, suspicious for nondisplaced fracture of left L4 transverse process. Dr. Mavis input appreciated.  Requesting PT and rehab to see.  Acute blood loss anemia Hemoglobin 13.7 on 10/27.  This has now drifted down to 10.2.  Some of this is dilutional. Follow CBC in AM.  Transfuse for hemoglobin 7 g or less.  Hypothyroidism Synthroid    Vitamin B12 deficiency Continue supplementation   Anxiety disorder/mood disorder As needed Xanax    Collagenous colitis No diarrhea Continue budesonide   13 x 6 mm ground glass nodule in the right lung apex Noted on CT C/A/P 11/13 Per recommendations, chest CT at 6 and 12 months to confirm persistence, if persistent then CT every 2 years until 5 years.  If growth or solid components develops, consider resection.  Body mass index is 41.47 kg/m.  Class III obesity Complicates care.  Outpatient follow-up.   DVT prophylaxis: SCDs Start: 05/08/24 1236     Code Status: Full Code:  Family Communication: Spouse at bedside. Disposition:  Status is: Inpatient Remains inpatient appropriate because: Hypotension, IV fluids, mental status changes.     Consultants:   Neurosurgery.  Procedures:     Subjective:  Patient interviewed and examined along with her spouse at bedside.  Patient is alert and oriented only to self.  Does not know where she is.  Knows that she fell at home.  Denies pain.  Per spouse, patient more confused than yesterday.  He also indicates that upon discharge, she was on combination of Percocet and Flexeril and remained intermittently confused through the course of the last  2 weeks.  Objective:   Vitals:   05/09/24 0753 05/09/24 1200 05/09/24 1211 05/09/24 1249  BP: (!) 116/104 (!) 144/134 (!) 84/51 (!) 87/56  Pulse: 80 75 74 76  Resp: (!) 25 20 (!) 33 (!) 21  Temp: 98.1 F (36.7 C) 97.9 F (36.6 C)    TempSrc: Oral  Oral    SpO2: 91% 92% 93% 94%  Weight:      Height:        General exam: Middle-age female, moderately built and obese sitting up comfortably in chair without distress.  Pleasantly confused.  Intermittently joking and making of the cuff remarks. Respiratory system: Clear to auscultation. Respiratory effort normal. Cardiovascular system: S1 & S2 heard, RRR. No JVD, murmurs, rubs, gallops or clicks. No pedal edema.  Telemetry personally reviewed: Sinus rhythm. Gastrointestinal system: Abdomen is nondistended, soft and nontender. No organomegaly or masses felt. Normal bowel sounds heard. Central nervous system: Mentation as noted above. No focal neurological deficits. Extremities: Symmetric 5 x 5 power. Skin: No rashes, lesions or ulcers Psychiatry: Judgement and insight impaired. Mood & affect jovial but confused.    Data Reviewed:   I have personally reviewed following labs and imaging studies   CBC: Recent Labs  Lab 05/08/24 0820 05/08/24 0827 05/09/24 0454  WBC 11.5*  --  6.7  NEUTROABS 9.1*  --   --   HGB 11.0* 10.9* 10.2*  HCT 32.3* 32.0* 29.4*  MCV 94.4  --  92.7  PLT 270  --  284    Basic Metabolic Panel: Recent Labs  Lab 05/08/24 0820 05/08/24 0827 05/08/24 1246 05/08/24 1700 05/09/24 0454  NA 122* 123* 129* 129* 131*  K 4.1 4.2 3.6 3.7 3.4*  CL 87* 86* 93* 94* 93*  CO2 23  --  25 23 25   GLUCOSE 128* 124* 117* 110* 104*  BUN 29* 33* 25* 21 14  CREATININE 1.52* 1.60* 0.95 1.03* 0.84  CALCIUM  9.8  --  9.2 9.2 9.4    Liver Function Tests: Recent Labs  Lab 05/08/24 0820 05/09/24 0454  AST 39 39  ALT 22 20  ALKPHOS 96 93  BILITOT 0.5 0.8  PROT 5.3* 5.1*  ALBUMIN 2.5* 2.3*    CBG: No results for input(s): GLUCAP in the last 168 hours.  Microbiology Studies:  No results found for this or any previous visit (from the past 240 hours).  Radiology Studies:  CT T-SPINE NO CHARGE Result Date: 05/08/2024 CLINICAL DATA:  63 year old with fall this  morning and complaint of back pain. Had back surgery 2 weeks ago per husband, per patient she had back surgery 15 years ago, arrives with back brace EXAM: CT THORACIC SPINE WITHOUT CONTRAST; CT LUMBAR SPINE WITHOUT CONTRAST TECHNIQUE: Multidetector CT images of the thoracic and lumbar spine were obtained using the standard protocol without intravenous contrast. RADIATION DOSE REDUCTION: This exam was performed according to the departmental dose-optimization program which includes automated exposure control, adjustment of the mA and/or kV according to patient size and/or use of iterative reconstruction technique. COMPARISON:  MRI lumbar spine 03/23/2024, lumbar spine radiographs 02/04/2024 and 01/25/2024 FINDINGS: There are postsurgical changes relating to L4-L5 posterior spinal fusion with transpedicular screws, lateral rods and interbody device. There is 5 mm grade 1 anterolisthesis of L4 on L5, post fusion, similar to preoperative exam. The orthopedic hardware appears intact. The left L5 transpedicular screw tip extends slightly beyond the anterior L5 vertebral body cortex. Alignment: There is otherwise no significant listhesis. Vertebrae: There is suggestion of  nondisplaced fracture of the left L4 transverse process (series 3, image 57 and series 5, image 39) with possible extension into the facet adjacent to the transpedicular screw (series 3, image 57). Otherwise no acute fracture or focal pathologic process. Again limbus L4 vertebra. Paraspinal and other soft tissues: Ill-defined right apical pulmonary ground-glass opacity, please see separately dictated report for CT chest, abdomen, and pelvis for further relevant evaluation. There is multilevel degenerative intervertebral disc height loss. IMPRESSION: Suspicion for nondisplaced fracture of the left L4 transverse process with possible extension adjacent to the transpedicular screw. Otherwise no acute fracture or traumatic malalignment of the thoracic or  lumbar spine. Postsurgical changes relating to L4-L5 posterior spinal fusion. The orthopedic hardware appears intact. Electronically Signed   By: Prentice Spade M.D.   On: 05/08/2024 10:29   CT L-SPINE NO CHARGE Result Date: 05/08/2024 CLINICAL DATA:  64 year old with fall this morning and complaint of back pain. Had back surgery 2 weeks ago per husband, per patient she had back surgery 15 years ago, arrives with back brace EXAM: CT THORACIC SPINE WITHOUT CONTRAST; CT LUMBAR SPINE WITHOUT CONTRAST TECHNIQUE: Multidetector CT images of the thoracic and lumbar spine were obtained using the standard protocol without intravenous contrast. RADIATION DOSE REDUCTION: This exam was performed according to the departmental dose-optimization program which includes automated exposure control, adjustment of the mA and/or kV according to patient size and/or use of iterative reconstruction technique. COMPARISON:  MRI lumbar spine 03/23/2024, lumbar spine radiographs 02/04/2024 and 01/25/2024 FINDINGS: There are postsurgical changes relating to L4-L5 posterior spinal fusion with transpedicular screws, lateral rods and interbody device. There is 5 mm grade 1 anterolisthesis of L4 on L5, post fusion, similar to preoperative exam. The orthopedic hardware appears intact. The left L5 transpedicular screw tip extends slightly beyond the anterior L5 vertebral body cortex. Alignment: There is otherwise no significant listhesis. Vertebrae: There is suggestion of nondisplaced fracture of the left L4 transverse process (series 3, image 57 and series 5, image 39) with possible extension into the facet adjacent to the transpedicular screw (series 3, image 57). Otherwise no acute fracture or focal pathologic process. Again limbus L4 vertebra. Paraspinal and other soft tissues: Ill-defined right apical pulmonary ground-glass opacity, please see separately dictated report for CT chest, abdomen, and pelvis for further relevant evaluation.  There is multilevel degenerative intervertebral disc height loss. IMPRESSION: Suspicion for nondisplaced fracture of the left L4 transverse process with possible extension adjacent to the transpedicular screw. Otherwise no acute fracture or traumatic malalignment of the thoracic or lumbar spine. Postsurgical changes relating to L4-L5 posterior spinal fusion. The orthopedic hardware appears intact. Electronically Signed   By: Prentice Spade M.D.   On: 05/08/2024 10:29   CT CHEST ABDOMEN PELVIS WO CONTRAST Result Date: 05/08/2024 EXAM: CT CHEST, ABDOMEN AND PELVIS WITHOUT CONTRAST 05/08/2024 08:48:00 AM TECHNIQUE: CT of the chest, abdomen and pelvis was performed without the administration of intravenous contrast. Multiplanar reformatted images are provided for review. Automated exposure control, iterative reconstruction, and/or weight based adjustment of the mA/kV was utilized to reduce the radiation dose to as low as reasonably achievable. COMPARISON: None available. CLINICAL HISTORY: Polytrauma, blunt. FINDINGS: CHEST: MEDIASTINUM AND LYMPH NODES: Heart and pericardium are unremarkable. The central airways are clear. No mediastinal, hilar or axillary lymphadenopathy. LUNGS AND PLEURA: Minimal bibasilar subsegmental atelectasis is noted. A 13 x 6 mm ground-glass opacity is seen in the right lung apex. Follow-up unenhanced chest CT in 3 months is recommended to ensure stability and rule out  neoplasm. No focal consolidation or pulmonary edema. No pleural effusion or pneumothorax. ABDOMEN AND PELVIS: LIVER: The liver is unremarkable. GALLBLADDER AND BILE DUCTS: Status post cholecystectomy. No biliary ductal dilatation. SPLEEN: No acute abnormality. PANCREAS: No acute abnormality. ADRENAL GLANDS: No acute abnormality. KIDNEYS, URETERS AND BLADDER: No stones in the kidneys or ureters. No hydronephrosis. No perinephric or periureteral stranding. Urinary bladder is unremarkable. GI AND BOWEL: Stomach demonstrates no  acute abnormality. There is no bowel obstruction. REPRODUCTIVE ORGANS: No acute abnormality. PERITONEUM AND RETROPERITONEUM: No ascites. No free air. VASCULATURE: Aorta is normal in caliber. ABDOMINAL AND PELVIS LYMPH NODES: No lymphadenopathy. REPRODUCTIVE ORGANS: No acute abnormality. BONES AND SOFT TISSUES: No acute osseous abnormality. No focal soft tissue abnormality. IMPRESSION: 1. 13 x 6 mm ground-glass nodule in the right lung apex. Recommend chest CT at 612 months to confirm persistence; if persistent, then CT every 2 years until 5 years. If growth or solid component develops, consider resection. 2. No definite traumatic abnormality seen. Electronically signed by: Lynwood Seip MD 05/08/2024 09:38 AM EST RP Workstation: [id number]   CT CERVICAL SPINE WO CONTRAST Result Date: 05/08/2024 CLINICAL DATA:  62 year old with blunt poly trauma EXAM: CT CERVICAL SPINE WITHOUT CONTRAST TECHNIQUE: Multidetector CT imaging of the cervical spine was performed without intravenous contrast. Multiplanar CT image reconstructions were also generated. RADIATION DOSE REDUCTION: This exam was performed according to the departmental dose-optimization program which includes automated exposure control, adjustment of the mA and/or kV according to patient size and/or use of iterative reconstruction technique. COMPARISON:  None Available. FINDINGS: Alignment: There is overall straightening of the normal cervical lordosis with multilevel trace, likely degenerative, anterolisthesis C2 on C3, C3 on C4, and C4 on C5 without evidence of acute or traumatic vertebral column listhesis. Skull base and vertebrae: No acute fracture. No primary bone lesion or focal pathologic process. Soft tissues and spinal canal: Limited evaluation of the spinal canal secondary to photon starvation artifact. Within this limitation, no high-grade canal stenosis is seen. Unremarkable extra-spinal soft tissues. Disc levels: There is multilevel uncovertebral  spurring and facet arthropathy with up to moderate/ severe neural foraminal narrowing, generally worse on the left. Upper chest: See dedicated chest imaging. Other: None. IMPRESSION: No acute fracture or traumatic malalignment of the cervical spine. Electronically Signed   By: Prentice Spade M.D.   On: 05/08/2024 09:26   CT HEAD WO CONTRAST Result Date: 05/08/2024 CLINICAL DATA:  63 year old with moderate to severe head trauma EXAM: CT HEAD WITHOUT CONTRAST TECHNIQUE: Contiguous axial images were obtained from the base of the skull through the vertex without intravenous contrast. RADIATION DOSE REDUCTION: This exam was performed according to the departmental dose-optimization program which includes automated exposure control, adjustment of the mA and/or kV according to patient size and/or use of iterative reconstruction technique. COMPARISON:  None Available. FINDINGS: Brain: No evidence of acute infarction, hemorrhage, hydrocephalus, extra-axial collection or mass lesion/mass effect.Moderate subcortical and periventricular hypodensity which is nonspecific but most likely represents the sequela chronic microvascular ischemia. Small chronic infarction left caudate head. Vascular: No hyperdense vessel or unexpected calcification. Skull: Normal. Negative for fracture or focal lesion. Sinuses/Orbits: No acute finding. Other: None. IMPRESSION: No acute intracranial hemorrhage or calvarial fracture. Electronically Signed   By: Prentice Spade M.D.   On: 05/08/2024 09:11   DG Chest Port 1 View Result Date: 05/08/2024 EXAM: 1 VIEW(S) XRAY OF THE CHEST 05/08/2024 08:30:00 AM COMPARISON: PA and lateral radiographs of the chest dated 02/04/2016. CLINICAL HISTORY: Trauma. FINDINGS: LUNGS AND PLEURA: Mild-to-moderate  elevation of the right hemidiaphragm. No focal pulmonary opacity. No pleural effusion. No pneumothorax. HEART AND MEDIASTINUM: No acute abnormality of the cardiac and mediastinal silhouettes. BONES AND SOFT  TISSUES: No acute osseous abnormality. IMPRESSION: 1. No acute process. 2. Mild-to-moderate elevation of the right hemidiaphragm. Electronically signed by: Evalene Coho MD 05/08/2024 08:35 AM EST RP Workstation: [id number]   DG Pelvis Portable Result Date: 05/08/2024 EXAM: 1 or 2 view(s) Xray of the pelvis 05/08/2024 08:30:00 AM COMPARISON: None available. CLINICAL HISTORY: Trauma Trauma FINDINGS: BONES AND JOINTS: No acute fracture. No focal osseous lesion. No joint dislocation. SOFT TISSUES: The soft tissues are unremarkable. IMPRESSION: 1. No significant abnormality. Electronically signed by: Evalene Coho MD 05/08/2024 08:34 AM EST RP Workstation: [id number]    Scheduled Meds:    budesonide   9 mg Oral Daily   cyanocobalamin  1,000 mcg Oral q AM   levothyroxine   100 mcg Oral Q0600   midodrine  10 mg Oral TID WC   multivitamin with minerals  1 tablet Oral q AM   rosuvastatin   40 mg Oral Daily    Continuous Infusions:     LOS: 1 day     Trenda Mar, MD,  FACP, [facility], [facility], [facility]   [facility] [place]      To contact the attending provider between 7A-7P or the covering provider during after hours 7P-7A, please log into the web site [email or web address] and access using universal [place] password for that web site. If you do not have the password, please call the hospital operator.  05/09/2024, 1:08 PM

## 2024-05-09 NOTE — Plan of Care (Signed)

## 2024-05-09 NOTE — Plan of Care (Signed)
  Problem: Education: Goal: Knowledge of General Education information will improve Description: Including pain rating scale, medication(s)/side effects and non-pharmacologic comfort measures Outcome: Progressing   Problem: Health Behavior/Discharge Planning: Goal: Ability to manage health-related needs will improve Outcome: Progressing   Problem: Clinical Measurements: Goal: Ability to maintain clinical measurements within normal limits will improve Outcome: Progressing Goal: Will remain free from infection Outcome: Progressing Goal: Diagnostic test results will improve Outcome: Progressing Goal: Respiratory complications will improve Outcome: Progressing Goal: Cardiovascular complication will be avoided Outcome: Progressing   Problem: Nutrition: Goal: Adequate nutrition will be maintained Outcome: Progressing   Problem: Coping: Goal: Level of anxiety will decrease Outcome: Progressing   Problem: Elimination: Goal: Will not experience complications related to bowel motility Outcome: Progressing Goal: Will not experience complications related to urinary retention Outcome: Progressing   Problem: Safety: Goal: Ability to remain free from injury will improve Outcome: Progressing   Problem: Skin Integrity: Goal: Risk for impaired skin integrity will decrease Outcome: Progressing   Problem: Fluid Volume: Goal: Compliance with measures to maintain balanced fluid volume will improve Outcome: Completed/Met

## 2024-05-09 NOTE — Progress Notes (Signed)
  Inpatient Rehabilitation Admissions Coordinator   Met with patient and spouse at bedside for rehab assessment. We discussed goals and expectations of a possible CIR admit. OT has completed their eval, PT eval pending. Spouse can provide expected caregiver support that is recommended. I await PT eval to assist in verifying that CIR level rehab is needed. We did verify that her [employer] does has both CIR and SNF benefits. They prefer CIR. We will follow up Monday. Please call me with any questions.   Heron Leavell, RN, MSN Rehab Admissions Coordinator [phone]

## 2024-05-09 NOTE — Evaluation (Signed)
 Physical Therapy Evaluation Patient Details Name: Jenny Yates MRN: [id number] DOB: 02-Nov-1960 Today's Date: 05/09/2024  History of Present Illness  Jenny Yates is a 63 y.o. female who presented 05/08/24 after a fall out of bed, decreased intake and severe generalized weakness. Found to have left L4 transverse process fracture and hypotension/AKI/hyponatremia in setting of dehydration. PMH significant for 10/29 L4-5 PLIF, HTN, hypothyroidism   Clinical Impression  Pt presents with condition above and deficits mentioned below, see PT Problem List. Prior to her back issues this year, she was independent without DME. Since her back surgery though, she has been needing assistance to ambulate with a youth RW, ambulating up to ~50 ft at a time. She lives with her husband in a 2-level house with 2 STE. She can stay on the main level of the house. Currently, the pt is demonstrating deficits in balance, gross strength, power, and activity tolerance. In addition, she displays deficits in cognition and expressive communication, which her husband reports has been progressive since her back surgery and occurs with certain medications. She was also noted to have warm, red, swollen bil lower legs, which her husband believes is due to pt not wearing her TED hose. RN notified as well. Currently, the pt is at high risk for falls, needing modA to transfer to stand and minA to ambulate up to ~8 ft with a RW before needing to sit to rest. She has had a drastic functional decline and has high motivation to improve, thus she could greatly benefit from intensive inpatient rehab, > 3 hours/day. Will continue to follow acutely. Her BP was soft but stable this session, see General Comments below.      If plan is discharge home, recommend the following: Assistance with cooking/housework;Assist for transportation;Help with stairs or ramp for entrance;A lot of help with walking and/or transfers;A lot of help with  bathing/dressing/bathroom;Direct supervision/assist for medications management;Direct supervision/assist for financial management;Supervision due to cognitive status   Can travel by private vehicle        Equipment Recommendations Rolling walker (2 wheels);BSC/3in1;Wheelchair (measurements PT);Wheelchair cushion (measurements PT) (pediatric height DME (4'10))  Recommendations for Other Services  Rehab consult    Functional Status Assessment Patient has had a recent decline in their functional status and demonstrates the ability to make significant improvements in function in a reasonable and predictable amount of time.     Precautions / Restrictions Precautions Precautions: Fall;Back Precaution Booklet Issued: No Recall of Precautions/Restrictions: Intact Precaution/Restrictions Comments: watch BP (soft) Spinal Brace: Lumbar corset;Applied in sitting position Restrictions Weight Bearing Restrictions Per Provider Order: No      Mobility  Bed Mobility               General bed mobility comments: OOB on arrival and end of session    Transfers Overall transfer level: Needs assistance Equipment used: Rolling walker (2 wheels) Transfers: Sit to/from Stand Sit to Stand: Mod assist           General transfer comment: Cues needed for hand placement, but due to short stature it seemed easier for her to pull up on the RW rather than reach her arms up to the armrests of the chair. Posterior lean noted, modA to shift weight anteriorly and maintain balance with transfer from recliner    Ambulation/Gait Ambulation/Gait assistance: Min assist Gait Distance (Feet): 8 Feet Assistive device: Rolling walker (2 wheels) Gait Pattern/deviations: Decreased stride length, Step-to pattern, Decreased step length - right, Decreased step length - left, Decreased  dorsiflexion - right, Decreased dorsiflexion - left, Shuffle, Wide base of support Gait velocity: reduced Gait velocity  interpretation: <1.31 ft/sec, indicative of household ambulator   General Gait Details: Pt with a very wide BOS and small steps with poor dorsiflexion, resulting in her sliding her bil feet to advance feet. Verbal and tactile cues needed to shift weight and coordinate steps and RW. MinA for balance. Pt fatigued quickly  Stairs            Wheelchair Mobility     Tilt Bed    Modified Rankin (Stroke Patients Only)       Balance Overall balance assessment: Needs assistance Sitting-balance support: Feet supported, No upper extremity supported Sitting balance-Leahy Scale: Fair   Postural control: Posterior lean Standing balance support: Bilateral upper extremity supported, During functional activity, Reliant on assistive device for balance Standing balance-Leahy Scale: Poor Standing balance comment: reliant on RW and min-modA                             Pertinent Vitals/Pain Pain Assessment Pain Assessment: Faces Faces Pain Scale: Hurts a little bit Pain Location: back? Pain Descriptors / Indicators: Discomfort, Grimacing Pain Intervention(s): Monitored during session, Limited activity within patient's tolerance, Repositioned    Home Living Family/patient expects to be discharged to:: Private residence Living Arrangements: Spouse/significant other Available Help at Discharge: Family;Available 24 hours/day Type of Home: House Home Access: Stairs to enter Entrance Stairs-Rails: Right;Left;Can reach both Entrance Stairs-Number of Steps: 2 (2 + a large threshold step) Alternate Level Stairs-Number of Steps: flight Home Layout: Two level;Able to live on main level with bedroom/bathroom Home Equipment: Shower seat;Grab bars - tub/shower;Cane - single point Additional Comments: spouse retired Insurance Underwriter    Prior Function Prior Level of Function : Independent/Modified Independent             Mobility Comments: youth RW for all mobility since back surgery. pts  husband assists with all transfers and mobility. Pt tolerates ~35ft. 4x/day per husband ADLs Comments: pt's husband assists with all aspects of ADLs, sponge bathing only since surgery. [name] husband also reports impaired cognition since surgery     Extremity/Trunk Assessment   Upper Extremity Assessment Upper Extremity Assessment: Defer to OT evaluation    Lower Extremity Assessment Lower Extremity Assessment: Generalized weakness;RLE deficits/detail;LLE deficits/detail RLE Deficits / Details: grossly 4/5 bil with MMT; erythema, edema, and warm to palpation bil lower legs (R>L), husband reports worse than baseline potentially due to not wearing TED hose; denied numbness/tingling LLE Deficits / Details: grossly 4/5 bil with MMT; erythema, edema, and warm to palpation bil lower legs (R>L), husband reports worse than baseline potentially due to not wearing TED hose; denied numbness/tingling    Cervical / Trunk Assessment Cervical / Trunk Assessment: Back Surgery (L4 TP fx)  Communication   Communication Communication: Impaired Factors Affecting Communication: Difficulty expressing self;Reduced clarity of speech (husband reports this has been occurring due to her meds)    Cognition Arousal: Alert Behavior During Therapy: Flat affect   PT - Cognitive impairments: Awareness, Memory, Attention, Initiation, Sequencing, Problem solving, Safety/Judgement                       PT - Cognition Comments: Husband reports pt is confused due to her meds. She displayed deficits in finding her words, which husband reports has been occuring with some meds. Pt slow to respond and follow cues. Difficulty sequencing mobility. Follows  simple cues with extra time Following commands: Impaired Following commands impaired: Follows one step commands with increased time     Cueing Cueing Techniques: Verbal cues, Tactile cues     General Comments General comments (skin integrity, edema, etc.): BP  74/51 (59) sitting start of session, 86/72 (78) standing, 86/55 (66) reclined end of session    Exercises     Assessment/Plan    PT Assessment Patient needs continued PT services  PT Problem List Decreased strength;Decreased activity tolerance;Decreased balance;Decreased mobility;Decreased knowledge of use of DME;Decreased safety awareness;Decreased knowledge of precautions;Pain;Decreased coordination;Decreased cognition;Cardiopulmonary status limiting activity       PT Treatment Interventions DME instruction;Gait training;Stair training;Functional mobility training;Therapeutic activities;Therapeutic exercise;Balance training;Patient/family education;Neuromuscular re-education;Cognitive remediation    PT Goals (Current goals can be found in the Care Plan section)  Acute Rehab PT Goals Patient Stated Goal: to get better PT Goal Formulation: With patient/family Time For Goal Achievement: 05/23/24 Potential to Achieve Goals: Good    Frequency Min 3X/week     Co-evaluation               AM-PAC PT 6 Clicks Mobility  Outcome Measure Help needed turning from your back to your side while in a flat bed without using bedrails?: A Little Help needed moving from lying on your back to sitting on the side of a flat bed without using bedrails?: A Little Help needed moving to and from a bed to a chair (including a wheelchair)?: A Little Help needed standing up from a chair using your arms (e.g., wheelchair or bedside chair)?: A Lot Help needed to walk in hospital room?: Total (<20 ft) Help needed climbing 3-5 steps with a railing? : Total 6 Click Score: 13    End of Session Equipment Utilized During Treatment: Gait belt;Back brace Activity Tolerance: Patient tolerated treatment well;Patient limited by fatigue Patient left: in chair;with call bell/phone within reach;with family/visitor present (no chair alarm upon arrival) Nurse Communication: Mobility status;Other (comment) (BP,  expressive deficits, erythema and edema and warmth to palpation bil lower legs) PT Visit Diagnosis: Unsteadiness on feet (R26.81);Other abnormalities of gait and mobility (R26.89);Muscle weakness (generalized) (M62.81);Difficulty in walking, not elsewhere classified (R26.2);History of falling (Z91.81)    Time: 8370-8349 PT Time Calculation (min) (ACUTE ONLY): 21 min   Charges:   PT Evaluation $PT Eval Moderate Complexity: 1 Mod   PT General Charges $$ ACUTE PT VISIT: 1 Visit         Theo Ferretti, PT, DPT Acute Rehabilitation Services  Office: [phone]   Theo CHRISTELLA Ferretti 05/09/2024, 5:40 PM

## 2024-05-09 NOTE — PMR Pre-admission (Signed)
 PMR Admission Coordinator Pre-Admission Assessment  Patient: Jenny Yates is an 63 y.o., female MRN: [id number] DOB: 1960/07/08 Height: 4' 11 (149.9 cm) Weight: 90.5 kg  Insurance Information HMO:     PPO:      PCP:      IPA:      80/20:      OTHER: Bronze S [employer] network ( marketplace) PRIMARY: [employer] [facility] QHP      Policy#: [id number]      Subscriber: pt CM Name: CM      Phone#: [phone]     Fax#: [phone] Pre-Cert#: [id number]  auth for CIR from faxed approval with [employer] for admit 05/13/24 with next review date 05/19/24.  Updates due to [phone] fax .  Benefits:  Phone #: [phone]     Name: 11/14 Eff. Date: 06/27/23 until 06/25/24     Deduct: $7500 ( met $6678.38)      Out of Pocket Max: $9200 (met)      Life Max: none CIR: 50%      SNF: 50% limit 90 days Outpatient: $50 per visit     Co-Pay:  Home Health: $50 per visit      Co-Pay:  DME: 50%     Co-Pay: 50% Providers: in network  SECONDARY: none      Policy#:      Phone#:   Artist:       Phone#:   The Data Processing Manager" for patients in Inpatient Rehabilitation Facilities with attached "Privacy Act Statement-Health Care Records" was provided and verbally reviewed with: Patient and Family  Emergency Contact Information Contact Information     Name Relation Home Work Mobile   [place] Spouse   [phone]      Other Contacts   None on File     Current Medical History  Patient Admitting Diagnosis: Debility  History of Present Illness:   Jenny Yates is a 63 year old right-handed female with history significant for hypertension, anxiety/depression as well as panic disorders, hypothyroidism, class III obesity BMI 40.30, hyperlipidemia, tobacco/alcohol use and chronic back pain status post L4-5 fusion 04/23/2024 per Dr. Mavis who presented 05/08/2024 after mechanical fall at home with some noted altered mental status as well as noted to be hypotensive.      Cranial CT scan showed no acute intracranial abnormality as well as follow-up MRI of the brain showing moderate generalized atrophy no acute findings..  CT of the chest and abdomen showed a 13 x 6 mm ground glass nodule in the right lung apex recommending CT chest 6 to 12 months.  CT lumbar spine showed possible nondisplaced fracture of the left L4 transverse process with hardware intact.  Neurosurgery follow-up advised conservative care and patient is maintained in a lumbar corset applied in sitting position.  Noted admission labs unremarkable except sodium 122 BUN 29 creatinine 1.52, WBC 11,500, urinalysis positive nitrite with rare bacteria but no UTI symptoms she was initially placed on empiric antibiotics since discontinued, magnesium 1.4, ammonia level 28.  In regards to patient's hyponatremia felt to be related to poor p.o. intake and HCTZ which was discontinued.  She did receive IV fluid bolus with latest sodium 133 she did have a mild vitamin B12 deficiency.  She initially required midodrine for hypotension which normalized and midodrine has been discontinued.  In regards to patient's acute metabolic encephalopathy felt to be related to polypharmacy dehydration and mild AKI which has improved.   Patient's medical record from [facility] has been reviewed  by the rehabilitation admission coordinator and physician.  Past Medical History  Past Medical History:  Diagnosis Date   Anemia    Anxiety    Collagenous colitis    Depression    Hyperlipidemia    Hypertension    Hypothyroidism    Lumbar herniated disc    Panic disorder    Has the patient had major surgery during 100 days prior to admission? Yes  Family History   family history is not on file.  Current Medications  Current Facility-Administered Medications:    0.9 %  sodium chloride infusion, , Intravenous, Continuous, Al-Sultani, Anmar, MD   acetaminophen  (TYLENOL ) tablet 650 mg, 650 mg, Oral, Q6H PRN, 650 mg at 05/13/24  1648 **OR** acetaminophen  (TYLENOL ) suppository 650 mg, 650 mg, Rectal, Q6H PRN, Ghimire, Shanker M, MD   albuterol (PROVENTIL) (2.5 MG/3ML) 0.083% nebulizer solution 2.5 mg, 2.5 mg, Nebulization, Q2H PRN, Ghimire, Donalda HERO, MD   ALPRAZolam  (XANAX ) tablet 0.25 mg, 0.25 mg, Oral, QHS PRN, Hongalgi, Anand D, MD, 0.25 mg at 05/14/24 0235   bisacodyl (DULCOLAX) suppository 10 mg, 10 mg, Rectal, Daily PRN, Ghimire, Donalda HERO, MD   budesonide  (ENTOCORT EC ) 24 hr capsule 9 mg, 9 mg, Oral, Daily, Ghimire, Shanker M, MD, 9 mg at 05/14/24 1002   celecoxib  (CELEBREX ) capsule 200 mg, 200 mg, Oral, Daily, Hongalgi, Anand D, MD, 200 mg at 05/14/24 1002   fluticasone  (FLONASE ) 50 MCG/ACT nasal spray 1 spray, 1 spray, Each Nare, Daily PRN, Ghimire, Donalda HERO, MD   lactose free nutrition (Boost) liquid 237 mL, 237 mL, Oral, Daily, Hongalgi, Anand D, MD, 237 mL at 05/12/24 0826   levothyroxine  (SYNTHROID ) tablet 100 mcg, 100 mcg, Oral, Q0600, Ghimire, Shanker M, MD, 100 mcg at 05/14/24 0607   multivitamin with minerals tablet 1 tablet, 1 tablet, Oral, q AM, Ghimire, Donalda HERO, MD, 1 tablet at 05/14/24 9392   Oral care mouth rinse, 15 mL, Mouth Rinse, PRN, Hongalgi, Anand D, MD   oxyCODONE (Oxy IR/ROXICODONE) immediate release tablet 5 mg, 5 mg, Oral, Q8H PRN, Hongalgi, Anand D, MD   polyethylene glycol (MIRALAX / GLYCOLAX) packet 17 g, 17 g, Oral, Daily PRN, Ghimire, Donalda HERO, MD   rosuvastatin  (CRESTOR ) tablet 40 mg, 40 mg, Oral, Daily, Ghimire, Shanker M, MD, 40 mg at 05/14/24 1002   thiamine (VITAMIN B1) tablet 100 mg, 100 mg, Oral, Daily, Hongalgi, Anand D, MD, 100 mg at 05/14/24 1002  Patients Current Diet:  Diet Order             Diet Heart Room service appropriate? Yes; Fluid consistency: Thin  Diet effective now                  Precautions / Restrictions Precautions Precautions: Fall, Back Precaution Booklet Issued: No Precaution/Restrictions Comments: pt able to recall 2/3 precautions with  increased time but requires max verbal cues to adhere functionally Spinal Brace: Lumbar corset, Applied in sitting position Restrictions Weight Bearing Restrictions Per Provider Order: No   Has the patient had 2 or more falls or a fall with injury in the past year? Yes  Prior Activity Level Community (5-7x/wk): prior to 4 25, independent with adls; decline since April  Prior Functional Level Self Care: Did the patient need help bathing, dressing, using the toilet or eating? Needed some help  Indoor Mobility: Did the patient need assistance with walking from room to room (with or without device)? Needed some help  Stairs: Did the patient need assistance with internal  or external stairs (with or without device)? Needed some help  Functional Cognition: Did the patient need help planning regular tasks such as shopping or remembering to take medications? Needed some help  Patient Information Are you of Hispanic, Latino/a,or Spanish origin?: A. No, not of Hispanic, Latino/a, or Spanish origin What is your race?: A. White Do you need or want an interpreter to communicate with a doctor or health care staff?: 0. No  Patient's Response To:  Health Literacy and Transportation Is the patient able to respond to health literacy and transportation needs?: No Health Literacy - How often do you need to have someone help you when you read instructions, pamphlets, or other written material from your doctor or pharmacy?: Patient unable to respond In the past 12 months, has lack of transportation kept you from medical appointments or from getting medications?: No In the past 12 months, has lack of transportation kept you from meetings, work, or from getting things needed for daily living?: No Higher Education Careers Adviser obtained via proxy:  (spouse answered)  Journalist, Newspaper / Equipment Home Equipment: Shower seat, Grab bars - tub/shower, Medical Laboratory Scientific Officer - single point  Prior Device Use: Indicate  devices/aids used by the patient prior to current illness, exacerbation or injury? Walker  Current Functional Level Cognition  Orientation Level: Oriented X4    Extremity Assessment (includes Sensation/Coordination)  Upper Extremity Assessment: Generalized weakness  Lower Extremity Assessment: Defer to PT evaluation RLE Deficits / Details: grossly 4/5 bil with MMT; erythema, edema, and warm to palpation bil lower legs (R>L), husband reports worse than baseline potentially due to not wearing TED hose; denied numbness/tingling LLE Deficits / Details: grossly 4/5 bil with MMT; erythema, edema, and warm to palpation bil lower legs (R>L), husband reports worse than baseline potentially due to not wearing TED hose; denied numbness/tingling    ADLs  Overall ADL's : Needs assistance/impaired Eating/Feeding: Minimal assistance, Sitting Grooming: Minimal assistance, Sitting Upper Body Bathing: Minimal assistance, With adaptive equipment, Cueing for sequencing, Sitting Upper Body Bathing Details (indicate cue type and reason): practiced with long handle sponge Lower Body Bathing: Moderate assistance, Cueing for sequencing, Cueing for back precautions, With adaptive equipment, Sit to/from stand Lower Body Bathing Details (indicate cue type and reason): demonstrated with long handle sponge, cues for back precautions Upper Body Dressing : Moderate assistance Upper Body Dressing Details (indicate cue type and reason): donning brace, husband assisted Lower Body Dressing: Maximal assistance, Sit to/from stand, With adaptive equipment Lower Body Dressing Details (indicate cue type and reason): husband can and does perform but practiced with sock donner this session as well as verbal education for grabber/reacher Toilet Transfer: Contact guard assist, Ambulation, Rolling walker (2 wheels) Toilet Transfer Details (indicate cue type and reason): functional ambulation WFL with RW, cues for hand  placement Toileting- Clothing Manipulation and Hygiene: Maximal assistance, Sit to/from stand Toileting - Clothing Manipulation Details (indicate cue type and reason): educated in toilet aide Functional mobility during ADLs: Contact guard assist, Rolling walker (2 wheels) General ADL Comments: continues to needs assist for LB tasks and cognition. Pt took a full shower with assist, she did dequence through most ADLs WFL but needed cues for safety    Mobility  General bed mobility comments: walking in hallway with RN upon arrival, in recliner at the end    Transfers  Overall transfer level: Needs assistance Equipment used: Rolling walker (2 wheels) Transfers: Sit to/from Stand Sit to Stand: Contact guard assist General transfer comment: cues needed for hand  placement, increased time - from elevated surface due to height of Pt    Ambulation / Gait / Stairs / Wheelchair Mobility  Ambulation/Gait Ambulation/Gait assistance: Editor, Commissioning (Feet): 150 Feet Assistive device: Rolling walker (2 wheels) Gait Pattern/deviations: Decreased stride length, Step-to pattern, Decreased step length - right, Decreased step length - left, Decreased dorsiflexion - right, Decreased dorsiflexion - left, Shuffle, Wide base of support General Gait Details: Pt with a very wide BOS and small steps with poor dorsiflexion resulting in minimal foot clearance, worse with onset of fatigue. multiple standing rest breaks, max directional cues during turning as pt unable to sequence or complete directional task without max directional cues. Pt was able to read room numbers on the R hand side but had to stop in front of every room and could not complete a dual task Gait velocity: reduced Gait velocity interpretation: <1.31 ft/sec, indicative of household ambulator    Posture / Balance Balance Overall balance assessment: Needs assistance Sitting-balance support: Feet supported, No upper extremity supported Sitting  balance-Leahy Scale: Fair Postural control: Posterior lean Standing balance support: Bilateral upper extremity supported, During functional activity, Reliant on assistive device for balance Standing balance-Leahy Scale: Poor Standing balance comment: reliant on RW or external support in static or dynamic standing balance    Special considerations/life events  Fall precautions orthostatic   Previous Home Environment  Living Arrangements: Spouse/significant other  Lives With: Spouse Available Help at Discharge: Family, Available 24 hours/day Type of Home: House Home Layout: Two level, Able to live on main level with bedroom/bathroom Alternate Level Stairs-Number of Steps: flight Home Access: Stairs to enter Entrance Stairs-Rails: Right, Left, Can reach both Entrance Stairs-Number of Steps: 2 (2 + a large threshold step) Bathroom Shower/Tub: Engineer, Manufacturing Systems: Standard Bathroom Accessibility: Yes How Accessible: Accessible via walker Home Care Services: No Additional Comments: spouse retired ICU nurse  Discharge Living Setting Plans for Discharge Living Setting: Patient's home, House, Lives with (comment) (spouse) Type of Home at Discharge: House Discharge Home Layout: Two level, Able to live on main level with bedroom/bathroom Alternate Level Stairs-Number of Steps: flight Discharge Home Access: Stairs to enter Entrance Stairs-Rails: Right, Left, Can reach both Entrance Stairs-Number of Steps: 2 + 2 large threshold Discharge Bathroom Shower/Tub: Tub/shower unit Discharge Bathroom Toilet: Standard Discharge Bathroom Accessibility: Yes How Accessible: Accessible via walker Does the patient have any problems obtaining your medications?: No  Social/Family/Support Systems Patient Roles: Spouse Contact Information: spouse Anticipated Caregiver: spouse Anticipated Caregiver's Contact Information: see contacts Ability/Limitations of Caregiver: no limitaitons; retired  Psychologist, Forensic Availability: 24/7 Discharge Plan Discussed with Primary Caregiver: Yes Is Caregiver In Agreement with Plan?: Yes Does Caregiver/Family have Issues with Lodging/Transportation while Pt is in Rehab?: No  Goals Patient/Family Goal for Rehab: supervision PT, supervision to min OT Expected length of stay: ELOS 7 to 10 days Pt/Family Agrees to Admission and willing to participate: Yes Program Orientation Provided & Reviewed with Pt/Caregiver Including Roles  & Responsibilities: Yes  Decrease burden of Care through IP rehab admission: n/a  Possible need for SNF placement upon discharge: not anticipated  Patient Condition: This patient's condition remains as documented in the consult dated 05/12/24, in which the Rehabilitation Physician determined and documented that the patient's condition is appropriate for intensive rehabilitative care in an inpatient rehabilitation facility. Will admit to inpatient rehab today.  Preadmission Screen Completed By:  Alison Heron Lot, RN MSN 05/14/2024 12:05 PM ______________________________________________________________________   Discussed status with Dr. Urbano on 05/14/24 at  1206 and received approval for admission today.  Admission Coordinator:  Alison Heron Lot, RN MSN time [id number] Date 05/14/24   Assessment/Plan: Diagnosis: L4-L5 PLIF 04/23/2024 by Dr. Mavis complicated by dehydration/acute metabolic encephalopathy  Does the need for close, 24 hr/day Medical supervision in concert with the patient's rehab needs make it unreasonable for this patient to be served in a less intensive setting? Yes Co-Morbidities requiring supervision/potential complications: hyponatremia, hypotension, hld, hypothyroidism, B12 deficiency, nodule R lung, Obesity, hx of alcohol abuse Due to bladder management, bowel management, safety, skin/wound care, disease management, medication administration, pain management, and patient education, does the  patient require 24 hr/day rehab nursing? Yes Does the patient require coordinated care of a physician, rehab nurse, PT, OT, and SLP to address physical and functional deficits in the context of the above medical diagnosis(es)? Yes Addressing deficits in the following areas: balance, endurance, locomotion, strength, transferring, bowel/bladder control, bathing, dressing, feeding, grooming, toileting, and psychosocial support Can the patient actively participate in an intensive therapy program of at least 3 hrs of therapy 5 days a week? Yes The potential for patient to make measurable gains while on inpatient rehab is excellent Anticipated functional outcomes upon discharge from inpatient rehab: supervision PT, supervision and min assist OT, n/a SLP Estimated rehab length of stay to reach the above functional goals is: 7-10 Anticipated discharge destination: Home 10. Overall Rehab/Functional Prognosis: excellent   MD Signature: Murray Collier

## 2024-05-09 NOTE — Progress Notes (Signed)
 Subjective: The patient is alert and pleasant.  She is in no apparent distress.  She is mildly confused.  Objective: Vital signs in last 24 hours: Temp:  [97.5 F (36.4 C)-99.3 F (37.4 C)] 98.3 F (36.8 C) (11/14 0300) Pulse Rate:  [74-96] 81 (11/14 0500) Resp:  [13-35] 29 (11/14 0500) BP: (66-126)/(32-105) 100/66 (11/14 0500) SpO2:  [90 %-100 %] 92 % (11/14 0500) Weight:  [90 kg] 90 kg (11/13 1157) Estimated body mass index is 41.47 kg/m as calculated from the following:   Height as of this encounter: 4' 10 (1.473 m).   Weight as of this encounter: 90 kg.   Intake/Output from previous day: 11/13 0701 - 11/14 0700 In: 3180 [P.O.:180; I.V.:2000; IV Piggyback:1000] Out: 500 [Urine:500] Intake/Output this shift: No intake/output data recorded.  Physical exam the patient is alert and pleasant.  She is mildly confused.  Her strength is normal.  Lab Results: Recent Labs    05/08/24 0820 05/08/24 0827 05/09/24 0454  WBC 11.5*  --  6.7  HGB 11.0* 10.9* 10.2*  HCT 32.3* 32.0* 29.4*  PLT 270  --  284   BMET Recent Labs    05/08/24 1700 05/09/24 0454  NA 129* 131*  K 3.7 3.4*  CL 94* 93*  CO2 23 25  GLUCOSE 110* 104*  BUN 21 14  CREATININE 1.03* 0.84  CALCIUM  9.2 9.4    Studies/Results: CT T-SPINE NO CHARGE Result Date: 05/08/2024 CLINICAL DATA:  62 year old with fall this morning and complaint of back pain. Had back surgery 2 weeks ago per husband, per patient she had back surgery 15 years ago, arrives with back brace EXAM: CT THORACIC SPINE WITHOUT CONTRAST; CT LUMBAR SPINE WITHOUT CONTRAST TECHNIQUE: Multidetector CT images of the thoracic and lumbar spine were obtained using the standard protocol without intravenous contrast. RADIATION DOSE REDUCTION: This exam was performed according to the departmental dose-optimization program which includes automated exposure control, adjustment of the mA and/or kV according to patient size and/or use of iterative  reconstruction technique. COMPARISON:  MRI lumbar spine 03/23/2024, lumbar spine radiographs 02/04/2024 and 01/25/2024 FINDINGS: There are postsurgical changes relating to L4-L5 posterior spinal fusion with transpedicular screws, lateral rods and interbody device. There is 5 mm grade 1 anterolisthesis of L4 on L5, post fusion, similar to preoperative exam. The orthopedic hardware appears intact. The left L5 transpedicular screw tip extends slightly beyond the anterior L5 vertebral body cortex. Alignment: There is otherwise no significant listhesis. Vertebrae: There is suggestion of nondisplaced fracture of the left L4 transverse process (series 3, image 57 and series 5, image 39) with possible extension into the facet adjacent to the transpedicular screw (series 3, image 57). Otherwise no acute fracture or focal pathologic process. Again limbus L4 vertebra. Paraspinal and other soft tissues: Ill-defined right apical pulmonary ground-glass opacity, please see separately dictated report for CT chest, abdomen, and pelvis for further relevant evaluation. There is multilevel degenerative intervertebral disc height loss. IMPRESSION: Suspicion for nondisplaced fracture of the left L4 transverse process with possible extension adjacent to the transpedicular screw. Otherwise no acute fracture or traumatic malalignment of the thoracic or lumbar spine. Postsurgical changes relating to L4-L5 posterior spinal fusion. The orthopedic hardware appears intact. Electronically Signed   By: Prentice Spade M.D.   On: 05/08/2024 10:29   CT L-SPINE NO CHARGE Result Date: 05/08/2024 CLINICAL DATA:  63 year old with fall this morning and complaint of back pain. Had back surgery 2 weeks ago per husband, per patient she had back surgery  15 years ago, arrives with back brace EXAM: CT THORACIC SPINE WITHOUT CONTRAST; CT LUMBAR SPINE WITHOUT CONTRAST TECHNIQUE: Multidetector CT images of the thoracic and lumbar spine were obtained using the  standard protocol without intravenous contrast. RADIATION DOSE REDUCTION: This exam was performed according to the departmental dose-optimization program which includes automated exposure control, adjustment of the mA and/or kV according to patient size and/or use of iterative reconstruction technique. COMPARISON:  MRI lumbar spine 03/23/2024, lumbar spine radiographs 02/04/2024 and 01/25/2024 FINDINGS: There are postsurgical changes relating to L4-L5 posterior spinal fusion with transpedicular screws, lateral rods and interbody device. There is 5 mm grade 1 anterolisthesis of L4 on L5, post fusion, similar to preoperative exam. The orthopedic hardware appears intact. The left L5 transpedicular screw tip extends slightly beyond the anterior L5 vertebral body cortex. Alignment: There is otherwise no significant listhesis. Vertebrae: There is suggestion of nondisplaced fracture of the left L4 transverse process (series 3, image 57 and series 5, image 39) with possible extension into the facet adjacent to the transpedicular screw (series 3, image 57). Otherwise no acute fracture or focal pathologic process. Again limbus L4 vertebra. Paraspinal and other soft tissues: Ill-defined right apical pulmonary ground-glass opacity, please see separately dictated report for CT chest, abdomen, and pelvis for further relevant evaluation. There is multilevel degenerative intervertebral disc height loss. IMPRESSION: Suspicion for nondisplaced fracture of the left L4 transverse process with possible extension adjacent to the transpedicular screw. Otherwise no acute fracture or traumatic malalignment of the thoracic or lumbar spine. Postsurgical changes relating to L4-L5 posterior spinal fusion. The orthopedic hardware appears intact. Electronically Signed   By: Prentice Spade M.D.   On: 05/08/2024 10:29   CT CHEST ABDOMEN PELVIS WO CONTRAST Result Date: 05/08/2024 EXAM: CT CHEST, ABDOMEN AND PELVIS WITHOUT CONTRAST 05/08/2024  08:48:00 AM TECHNIQUE: CT of the chest, abdomen and pelvis was performed without the administration of intravenous contrast. Multiplanar reformatted images are provided for review. Automated exposure control, iterative reconstruction, and/or weight based adjustment of the mA/kV was utilized to reduce the radiation dose to as low as reasonably achievable. COMPARISON: None available. CLINICAL HISTORY: Polytrauma, blunt. FINDINGS: CHEST: MEDIASTINUM AND LYMPH NODES: Heart and pericardium are unremarkable. The central airways are clear. No mediastinal, hilar or axillary lymphadenopathy. LUNGS AND PLEURA: Minimal bibasilar subsegmental atelectasis is noted. A 13 x 6 mm ground-glass opacity is seen in the right lung apex. Follow-up unenhanced chest CT in 3 months is recommended to ensure stability and rule out neoplasm. No focal consolidation or pulmonary edema. No pleural effusion or pneumothorax. ABDOMEN AND PELVIS: LIVER: The liver is unremarkable. GALLBLADDER AND BILE DUCTS: Status post cholecystectomy. No biliary ductal dilatation. SPLEEN: No acute abnormality. PANCREAS: No acute abnormality. ADRENAL GLANDS: No acute abnormality. KIDNEYS, URETERS AND BLADDER: No stones in the kidneys or ureters. No hydronephrosis. No perinephric or periureteral stranding. Urinary bladder is unremarkable. GI AND BOWEL: Stomach demonstrates no acute abnormality. There is no bowel obstruction. REPRODUCTIVE ORGANS: No acute abnormality. PERITONEUM AND RETROPERITONEUM: No ascites. No free air. VASCULATURE: Aorta is normal in caliber. ABDOMINAL AND PELVIS LYMPH NODES: No lymphadenopathy. REPRODUCTIVE ORGANS: No acute abnormality. BONES AND SOFT TISSUES: No acute osseous abnormality. No focal soft tissue abnormality. IMPRESSION: 1. 13 x 6 mm ground-glass nodule in the right lung apex. Recommend chest CT at 612 months to confirm persistence; if persistent, then CT every 2 years until 5 years. If growth or solid component develops, consider  resection. 2. No definite traumatic abnormality seen. Electronically signed by:  Lynwood Seip MD 05/08/2024 09:38 AM EST RP Workstation: [id number]   CT CERVICAL SPINE WO CONTRAST Result Date: 05/08/2024 CLINICAL DATA:  63 year old with blunt poly trauma EXAM: CT CERVICAL SPINE WITHOUT CONTRAST TECHNIQUE: Multidetector CT imaging of the cervical spine was performed without intravenous contrast. Multiplanar CT image reconstructions were also generated. RADIATION DOSE REDUCTION: This exam was performed according to the departmental dose-optimization program which includes automated exposure control, adjustment of the mA and/or kV according to patient size and/or use of iterative reconstruction technique. COMPARISON:  None Available. FINDINGS: Alignment: There is overall straightening of the normal cervical lordosis with multilevel trace, likely degenerative, anterolisthesis C2 on C3, C3 on C4, and C4 on C5 without evidence of acute or traumatic vertebral column listhesis. Skull base and vertebrae: No acute fracture. No primary bone lesion or focal pathologic process. Soft tissues and spinal canal: Limited evaluation of the spinal canal secondary to photon starvation artifact. Within this limitation, no high-grade canal stenosis is seen. Unremarkable extra-spinal soft tissues. Disc levels: There is multilevel uncovertebral spurring and facet arthropathy with up to moderate/ severe neural foraminal narrowing, generally worse on the left. Upper chest: See dedicated chest imaging. Other: None. IMPRESSION: No acute fracture or traumatic malalignment of the cervical spine. Electronically Signed   By: Prentice Spade M.D.   On: 05/08/2024 09:26   CT HEAD WO CONTRAST Result Date: 05/08/2024 CLINICAL DATA:  63 year old with moderate to severe head trauma EXAM: CT HEAD WITHOUT CONTRAST TECHNIQUE: Contiguous axial images were obtained from the base of the skull through the vertex without intravenous contrast. RADIATION  DOSE REDUCTION: This exam was performed according to the departmental dose-optimization program which includes automated exposure control, adjustment of the mA and/or kV according to patient size and/or use of iterative reconstruction technique. COMPARISON:  None Available. FINDINGS: Brain: No evidence of acute infarction, hemorrhage, hydrocephalus, extra-axial collection or mass lesion/mass effect.Moderate subcortical and periventricular hypodensity which is nonspecific but most likely represents the sequela chronic microvascular ischemia. Small chronic infarction left caudate head. Vascular: No hyperdense vessel or unexpected calcification. Skull: Normal. Negative for fracture or focal lesion. Sinuses/Orbits: No acute finding. Other: None. IMPRESSION: No acute intracranial hemorrhage or calvarial fracture. Electronically Signed   By: Prentice Spade M.D.   On: 05/08/2024 09:11   DG Chest Port 1 View Result Date: 05/08/2024 EXAM: 1 VIEW(S) XRAY OF THE CHEST 05/08/2024 08:30:00 AM COMPARISON: PA and lateral radiographs of the chest dated 02/04/2016. CLINICAL HISTORY: Trauma. FINDINGS: LUNGS AND PLEURA: Mild-to-moderate elevation of the right hemidiaphragm. No focal pulmonary opacity. No pleural effusion. No pneumothorax. HEART AND MEDIASTINUM: No acute abnormality of the cardiac and mediastinal silhouettes. BONES AND SOFT TISSUES: No acute osseous abnormality. IMPRESSION: 1. No acute process. 2. Mild-to-moderate elevation of the right hemidiaphragm. Electronically signed by: Evalene Coho MD 05/08/2024 08:35 AM EST RP Workstation: [id number]   DG Pelvis Portable Result Date: 05/08/2024 EXAM: 1 or 2 view(s) Xray of the pelvis 05/08/2024 08:30:00 AM COMPARISON: None available. CLINICAL HISTORY: Trauma Trauma FINDINGS: BONES AND JOINTS: No acute fracture. No focal osseous lesion. No joint dislocation. SOFT TISSUES: The soft tissues are unremarkable. IMPRESSION: 1. No significant abnormality. Electronically  signed by: Evalene Coho MD 05/08/2024 08:34 AM EST RP Workstation: [id number]    Assessment/Plan: Post lumbar fusion: I will ask PT and rehab to see the patient.  Renal insufficiency: This was likely secondary to dehydration.  Her BUN and creatinine are back to normal today.  I appreciate Dr. Nanette care of this patient.  LOS:  1 day     Jenny Yates 05/09/2024, 7:45 AM     Patient ID: Jenny Yates, female   DOB: 03/28/1961, 63 y.o.   MRN: [id number]

## 2024-05-09 NOTE — Progress Notes (Signed)
   05/09/24 0500  Vitals  BP 100/66  MAP (mmHg) 74  BP Location Right Wrist  BP Method Automatic  Patient Position (if appropriate) Lying  Pulse Rate 81  Pulse Rate Source Monitor  ECG Heart Rate 82  Resp (!) 29  MEWS COLOR  MEWS Score Color Yellow  Oxygen Therapy  SpO2 92 %  O2 Device Room Air  Pain Assessment  Pain Scale 0-10  Pain Score 0  MEWS Score  MEWS Temp 0  MEWS Systolic 1  MEWS Pulse 0  MEWS RR 2  MEWS LOC 0  MEWS Score 3   Patient is alert/oriented x3, disoriented to place. She is tachypneic, SpO2 > 92% on room air.

## 2024-05-09 NOTE — Evaluation (Signed)
 Occupational Therapy Evaluation Patient Details Name: Jenny Yates MRN: [id number] DOB: 31-Aug-1960 Today's Date: 05/09/2024   History of Present Illness   Jenny Yates is a 63 y.o. female who presented after a fall out of bed, decreased intake and severe generalized weakness. Found to have left L4 transverse process fracture and hypotension/AKI/hyponatremia in setting of dehydration. PMH significant for 10/29 L4-5 PLIF, HTN, hypothyroidism     Clinical Impressions Jenny Yates was evaluated s/p the above admission list. Per her husband pt is indep and active at baseline prior to her recent back surgery. However since her surgery she has needed assist for all mobility and ADLs and has had increased confusion/impaired cognition. Upon evaluation the pt was limited by back precautions, weakness, general debility, SpO2 88-91% on RA, soft BP (recovered with activity), dizziness with standing, poor activity tolerance and significant confusion. Overall she needed min to stand with RW and walk ~12ft, pt reported dizziness but her BP was stable. Due to the deficits listed below the pt also needs up to total A for LB ADLs and mod A for UB ADLs. Pt will benefit from continued acute OT and intensive inpatient follow up therapy, >3 hours/day after discharge.       If plan is discharge home, recommend the following:   A lot of help with walking and/or transfers;A lot of help with bathing/dressing/bathroom;Assistance with cooking/housework;Assist for transportation;Help with stairs or ramp for entrance     Functional Status Assessment   Patient has had a recent decline in their functional status and demonstrates the ability to make significant improvements in function in a reasonable and predictable amount of time.     Equipment Recommendations   None recommended by OT     Recommendations for Other Services   Rehab consult     Precautions/Restrictions   Precautions Precautions:  Fall;Back Precaution Booklet Issued: No Recall of Precautions/Restrictions: Intact Spinal Brace: Lumbar corset;Applied in sitting position Restrictions Weight Bearing Restrictions Per Provider Order: No     Mobility Bed Mobility               General bed mobility comments: OOB on arrival    Transfers Overall transfer level: Needs assistance Equipment used: Rolling walker (2 wheels) Transfers: Sit to/from Stand Sit to Stand: Min assist                  Balance Overall balance assessment: Needs assistance Sitting-balance support: Feet supported, No upper extremity supported Sitting balance-Leahy Scale: Fair     Standing balance support: Bilateral upper extremity supported, During functional activity Standing balance-Leahy Scale: Poor                             ADL either performed or assessed with clinical judgement   ADL Overall ADL's : Needs assistance/impaired Eating/Feeding: Minimal assistance;Sitting   Grooming: Minimal assistance;Sitting   Upper Body Bathing: Moderate assistance;Sitting   Lower Body Bathing: Total assistance;Sit to/from stand   Upper Body Dressing : Moderate assistance;Sitting   Lower Body Dressing: Total assistance;Sit to/from stand   Toilet Transfer: Minimal assistance;Ambulation;Rolling walker (2 wheels) Toilet Transfer Details (indicate cue type and reason): ambulated ~11ft Toileting- Clothing Manipulation and Hygiene: Total assistance;Sit to/from stand       Functional mobility during ADLs: Minimal assistance;Rolling walker (2 wheels) General ADL Comments: limited by back precautions, impaired cognition, weakness, debility     Vision Baseline Vision/History: 1 Wears glasses Ability to See in Adequate Light:  0 Adequate Vision Assessment?: No apparent visual deficits     Perception Perception: Not tested       Praxis Praxis: Not tested       Pertinent Vitals/Pain Pain Assessment Pain Assessment:  Faces Faces Pain Scale: Hurts a little bit Pain Location: back? Pain Descriptors / Indicators: Discomfort Pain Intervention(s): Monitored during session, Limited activity within patient's tolerance     Extremity/Trunk Assessment Upper Extremity Assessment Upper Extremity Assessment: Generalized weakness   Lower Extremity Assessment Lower Extremity Assessment: Defer to PT evaluation   Cervical / Trunk Assessment Cervical / Trunk Assessment: Back Surgery (L4 TP fx)   Communication Communication Communication: No apparent difficulties   Cognition Arousal: Alert Behavior During Therapy: Flat affect Cognition: Cognition impaired   Orientation impairments: Situation, Place, Time Awareness: Intellectual awareness impaired Memory impairment (select all impairments): Short-term memory, Working civil service fast streamer, Conservation officer, historic buildings Attention impairment (select first level of impairment): Sustained attention Executive functioning impairment (select all impairments): Organization, Sequencing, Reasoning, Problem solving OT - Cognition Comments: pt's husband reports declinign cognition since back surgery. Pt unable to state location despite max cues or situation. pt significantly confused throughout session, unable to answer basic PLOF or some set up questions. She did follow simple 1 step commands with cues for attention                 Following commands: Impaired Following commands impaired: Follows one step commands inconsistently     Cueing  General Comments   Cueing Techniques: Verbal cues  SpO2 88-91% throughout on RA, BP soft on arrival, BP increased with mobiliyt, pt reported dizziness 2x throughout           Home Living Family/patient expects to be discharged to:: Private residence Living Arrangements: Spouse/significant other Available Help at Discharge: Family;Available 24 hours/day Type of Home: House Home Access: Stairs to enter [employer] of  Steps: 2 (2 + a large threshold step) Entrance Stairs-Rails: Right;Left;Can reach both Home Layout: Two level;Able to live on main level with bedroom/bathroom Alternate Level Stairs-Number of Steps: flight   Bathroom Shower/Tub: Chief Strategy Officer: Standard     Home Equipment: Shower seat;Grab bars - tub/shower;Cane - single point          Prior Functioning/Environment Prior Level of Function : Independent/Modified Independent             Mobility Comments: youth RW for all mobility since back surgery. pts husband assists with all transfers and mobility. Pt tolerates ~68ft. 4x/day per husband ADLs Comments: pt's husband assists with all aspects of ADLs, sponge bathing only since surgery. [name] husband also reports impaired cognition since surgery    OT Problem List: Decreased range of motion;Decreased activity tolerance;Impaired balance (sitting and/or standing);Decreased cognition;Decreased safety awareness;Decreased knowledge of use of DME or AE;Decreased knowledge of precautions   OT Treatment/Interventions: Self-care/ADL training;Therapeutic exercise;DME and/or AE instruction;Therapeutic activities;Balance training;Patient/family education      OT Goals(Current goals can be found in the care plan section)   Acute Rehab OT Goals Patient Stated Goal: per husband to get better OT Goal Formulation: With patient Time For Goal Achievement: 05/23/24 Potential to Achieve Goals: Good   OT Frequency:  Min 2X/week    Co-evaluation              AM-PAC OT 6 Clicks Daily Activity     Outcome Measure Help from another person eating meals?: A Little Help from another person taking care of personal grooming?: A Little Help from another person  toileting, which includes using toliet, bedpan, or urinal?: A Lot Help from another person bathing (including washing, rinsing, drying)?: A Lot Help from another person to put on and taking off regular upper body  clothing?: A Lot Help from another person to put on and taking off regular lower body clothing?: Total 6 Click Score: 13   End of Session Equipment Utilized During Treatment: Rolling walker (2 wheels);Back brace Nurse Communication: Mobility status  Activity Tolerance: Patient tolerated treatment well Patient left: in chair;with call bell/phone within reach;with family/visitor present  OT Visit Diagnosis: Unsteadiness on feet (R26.81);Other abnormalities of gait and mobility (R26.89);Muscle weakness (generalized) (M62.81);History of falling (Z91.81);Pain                Time: 8877-8854 OT Time Calculation (min): 23 min Charges:  OT General Charges $OT Visit: 1 Visit OT Evaluation $OT Eval Moderate Complexity: 1 Mod OT Treatments $Therapeutic Activity: 8-22 mins  Lucie Kendall, OTR/L Acute Rehabilitation Services Office [phone] Secure Chat Communication Preferred   Lucie JONETTA Kendall 05/09/2024, 12:48 PM

## 2024-05-09 NOTE — Progress Notes (Signed)
   05/09/24 1211  Assess: MEWS Score  BP (!) 84/51  MAP (mmHg) (!) 62  Pulse Rate 74  ECG Heart Rate 76  Resp (!) 33  Level of Consciousness Alert  SpO2 93 %  Assess: MEWS Score  MEWS Temp 0  MEWS Systolic 1  MEWS Pulse 0  MEWS RR 2  MEWS LOC 0  MEWS Score 3  MEWS Score Color Yellow  Assess: if the MEWS score is Yellow or Red  Were vital signs accurate and taken at a resting state? Yes  Does the patient meet 2 or more of the SIRS criteria? No  MEWS guidelines implemented  Yes, yellow  Treat  MEWS Interventions Considered administering scheduled or prn medications/treatments as ordered  Take Vital Signs  Increase Vital Sign Frequency  Yellow: Q2hr x1, continue Q4hrs until patient remains green for 12hrs  Escalate  MEWS: Escalate Yellow: Discuss with charge nurse and consider notifying provider and/or RRT  Notify: Charge Nurse/RN  Name of Charge Nurse/RN Notified Actuary  Provider Notification  Provider Name/Title Judeth Lulas  Date Provider Notified 05/09/24  Time Provider Notified 1211  Method of Notification Page  Notification Reason Other (Comment) (hypotensive, no status change)  Provider response No new orders  Assess: SIRS CRITERIA  SIRS Temperature  0  SIRS Respirations  1  SIRS Pulse 0  SIRS WBC 0  SIRS Score Sum  1

## 2024-05-09 NOTE — TOC CM/SW Note (Signed)
 Transition of Care [facility]) - Inpatient Brief Assessment   Patient Details  Name: Jenny Yates MRN: [id number] Date of Birth: 21-Nov-1960  [facility]) CM/SW Contact:    Zamyah Wiesman M, RN Phone Number: 05/09/2024, 2:31 PM   Clinical Narrative: Jenny Yates is a 63 y.o. female who presented after a fall out of bed, decreased intake and severe generalized weakness. Found to have left L4 transverse process fracture and hypotension/AKI/hyponatremia in setting of dehydration.  Patient s/p recent back surgery on 04/23/2024.  PT/OT recommending CIR and consult in progress; husband able to provide needed assistance at dc.    Transition of Care Asessment: Insurance and Status: Insurance coverage has been reviewed Patient has primary care physician: Yes Hezzie Fleeta [facility]) Home environment has been reviewed: Lives with spouse Prior level of function:: Mod Independent; uses youth RW since back surgery Prior/Current Home Services: No current home services Social Drivers of Health Review: SDOH reviewed no interventions necessary Readmission risk has been reviewed: Yes Transition of care needs: no transition of care needs at this time  Mliss MICAEL Fass, RN, BSN  Trauma/Neuro ICU Case Manager [phone]

## 2024-05-09 NOTE — Progress Notes (Signed)
   05/09/24 1430  Assess: MEWS Score  BP (!) 80/58  MAP (mmHg) 67  Pulse Rate 76  ECG Heart Rate 76  Resp 16  Level of Consciousness Alert  SpO2 91 %  Assess: MEWS Score  MEWS Temp 0  MEWS Systolic 2  MEWS Pulse 0  MEWS RR 0  MEWS LOC 0  MEWS Score 2  MEWS Score Color Yellow  Assess: if the MEWS score is Yellow or Red  MEWS guidelines implemented  Yes, yellow  Treat  MEWS Interventions Considered administering scheduled or prn medications/treatments as ordered  Take Vital Signs  Increase Vital Sign Frequency  Yellow: Q2hr x1, continue Q4hrs until patient remains green for 12hrs  Escalate  MEWS: Escalate Yellow: Discuss with charge nurse and consider notifying provider and/or RRT  Notify: Charge Nurse/RN  Name of Charge Nurse/RN Notified Actuary  Provider Notification  Provider Name/Title Pascal Lulas  Date Provider Notified 05/09/24  Time Provider Notified 1457  Method of Notification Page  Notification Reason Other (Comment) (hypotensive, no status change)  Provider response No new orders  Assess: SIRS CRITERIA  SIRS Temperature  0  SIRS Respirations  0  SIRS Pulse 0  SIRS WBC 0  SIRS Score Sum  0

## 2024-05-10 ENCOUNTER — Inpatient Hospital Stay (HOSPITAL_COMMUNITY)

## 2024-05-10 DIAGNOSIS — G9341 Metabolic encephalopathy: Secondary | ICD-10-CM | POA: Diagnosis not present

## 2024-05-10 DIAGNOSIS — E871 Hypo-osmolality and hyponatremia: Secondary | ICD-10-CM | POA: Diagnosis not present

## 2024-05-10 LAB — CBC
HCT: 31.4 % — ABNORMAL LOW (ref 36.0–46.0)
Hemoglobin: 10.7 g/dL — ABNORMAL LOW (ref 12.0–15.0)
MCH: 31.8 pg (ref 26.0–34.0)
MCHC: 34.1 g/dL (ref 30.0–36.0)
MCV: 93.2 fL (ref 80.0–100.0)
Platelets: 298 K/uL (ref 150–400)
RBC: 3.37 MIL/uL — ABNORMAL LOW (ref 3.87–5.11)
RDW: 12.9 % (ref 11.5–15.5)
WBC: 6.2 K/uL (ref 4.0–10.5)
nRBC: 0 % (ref 0.0–0.2)

## 2024-05-10 LAB — MAGNESIUM: Magnesium: 2.5 mg/dL — ABNORMAL HIGH (ref 1.7–2.4)

## 2024-05-10 LAB — BASIC METABOLIC PANEL WITH GFR
Anion gap: 12 (ref 5–15)
BUN: 12 mg/dL (ref 8–23)
CO2: 24 mmol/L (ref 22–32)
Calcium: 9.6 mg/dL (ref 8.9–10.3)
Chloride: 95 mmol/L — ABNORMAL LOW (ref 98–111)
Creatinine, Ser: 0.82 mg/dL (ref 0.44–1.00)
GFR, Estimated: >60 mL/min (ref >60)
Glucose, Bld: 113 mg/dL — ABNORMAL HIGH (ref 70–99)
Potassium: 3.8 mmol/L (ref 3.5–5.1)
Sodium: 131 mmol/L — ABNORMAL LOW (ref 135–145)

## 2024-05-10 LAB — BLOOD GAS, VENOUS
Acid-Base Excess: 7.4 mmol/L — ABNORMAL HIGH (ref 0.0–2.0)
Bicarbonate: 32 mmol/L — ABNORMAL HIGH (ref 20.0–28.0)
O2 Saturation: 82 %
Patient temperature: 36.4
pCO2, Ven: 43 mmHg — ABNORMAL LOW (ref 44–60)
pH, Ven: 7.48 — ABNORMAL HIGH (ref 7.25–7.43)
pO2, Ven: 48 mmHg — ABNORMAL HIGH (ref 32–45)

## 2024-05-10 LAB — VITAMIN B12: Vitamin B-12: 2288 pg/mL — ABNORMAL HIGH (ref 180–914)

## 2024-05-10 LAB — CORTISOL: Cortisol, Plasma: 1.5 ug/dL

## 2024-05-10 MED ORDER — ALPRAZOLAM 0.5 MG PO TABS
0.2500 mg | ORAL_TABLET | Freq: Every evening | ORAL | Status: DC | PRN
  Administered 2024-05-14: 0.25 mg via ORAL
  Filled 2024-05-10 (×2): qty 1

## 2024-05-10 MED ORDER — COSYNTROPIN 0.25 MG IJ SOLR
0.2500 mg | Freq: Once | INTRAMUSCULAR | Status: DC
  Filled 2024-05-10 (×2): qty 0.25

## 2024-05-10 MED ORDER — THIAMINE HCL 100 MG/ML IJ SOLN
500.0000 mg | INTRAVENOUS | Status: AC
  Administered 2024-05-10 – 2024-05-12 (×3): 500 mg via INTRAVENOUS
  Filled 2024-05-10 (×3): qty 5

## 2024-05-10 NOTE — Progress Notes (Signed)
 PROGRESS NOTE   Jenny Yates  FMW:[id number]    DOB: [birth year]    DOA: 05/08/2024  PCP: Fleeta Valeria Mayo, MD   I have briefly reviewed patients previous medical records in [facility].   Brief Hospital Course:  63 year old female, lives with her spouse who is a retired Insurance Underwriter, medical history significant for HTN, anxiety/depression, chronic pain, s/p L4-L5 fusion on 04/23/2024 who presented to the ED following mechanical fall at home, confusion, noted to have hypotension, hyponatremia, acute kidney injury in the setting of dehydration.  AKI resolved.  Hypotension better.  Ongoing issues with confusion, hallucinations.   Assessment & Plan:   Mechanical fall at home Physical deconditioning Recent spine surgery Apart from suspicious nondisplaced fracture of left L4 transverse process, extensive imaging below without acute findings. Fall likely multifactorial due to dehydration, hypotension, polypharmacy. Therapies have recommended AIR, CIR following.  Dehydration with hyponatremia Secondary to poor oral intake at home, and HCTZ. Presented with serum sodium of 122. Post bolus IV fluids and brief maintenance IV fluids (stopped on hospital day 1), serum sodium has been stable in the low 130s.  Ongoing poor oral intake, encourage p.o. intake.  Hesitant to add Remeron due to ongoing issues with AMS.  Hypokalemia Hypomagnesemia Replaced.  Hypotension Not sure if all of this is due to volume depletion. Holding antihypertensives (PTA meds: Hydralazine  50 mg twice daily, HCTZ 25 mg daily, Bystolic  20 mg nightly, Benicar  20 mg twice daily) S/p 1 L IV fluid bolus on admission and hospital day 2.  Improved, SBP in the 90s.  Patient asymptomatic. Continue midodrine 10 mg 3 times daily.  A.m. cortisol 1.5/low.  Check ACTH stim test in AM.  Acute metabolic encephalopathy Multifactorial due to polypharmacy, dehydration and AKI.  As per discussion with spouse, history of significant alcohol  use disorder in the recent past. CT head without acute findings.  Checking MRI brain without contrast Delirium precautions.  Minimize opioids and sedatives.  On reduced dose Xanax  here.  Discontinued Flexeril.  Monitor. Check thiamine, B12 levels.  Started high-dose IV thiamine. Given history of snoring, check VBG, no CO2 retention. Has not gotten any opioids here.  Essential hypertension Hypotensive now.  See above.  Alcohol use disorder Per spouse's report, patient used to drink 4 to 5 glasses of red wine daily prior to recent spine surgery but since then has been drinking maybe half a glass of wine daily.  No overt withdrawal.  Check CIWA scores and only consider Ativan if consistently high.  S/p L4-5 fusion 04/23/2024 Extensive CT T/L/C-spine as noted below, suspicious for nondisplaced fracture of left L4 transverse process. Dr. Mavis input appreciated.   Acute blood loss anemia Hemoglobin 13.7 on 10/27.  This has now drifted down to 10.2, stable over the last 2 days.  Hypothyroidism Synthroid .  TSH in June normal.   Vitamin B12 deficiency Continue supplementation, checking B12 levels.   Anxiety disorder/mood disorder As needed Xanax    Collagenous colitis No diarrhea Continue budesonide   13 x 6 mm ground glass nodule in the right lung apex Noted on CT C/A/P 11/13 Per recommendations, chest CT at 6 and 12 months to confirm persistence, if persistent then CT every 2 years until 5 years.  If growth or solid components develops, consider resection.  Body mass index is 41.47 kg/m.  Class III obesity Complicates care.  Outpatient follow-up.   DVT prophylaxis: SCDs Start: 05/08/24 1236     Code Status: Full Code:  Family Communication: Discussed in  detail with patient's spouse at bedside. Disposition:  Status is: Inpatient Remains inpatient appropriate because: Hypotension, IV fluids, mental status changes.     Consultants:   Neurosurgery.  Procedures:      Subjective:  Patient somnolent but easily arousable, oriented to self, confused.  As per spouse at bedside, was up in chair for 7 to 8 hours yesterday, ambulated twice with assistance to the bathroom.  Both indicate that she is not eating much due to food too spicy and does not have appetite.  Advised both of them that spouse can bring food that she likes from outside.  Spouse and RN reported visual hallucinations, patient saying mice around the room.  She also told RN that her job lost millions of dollars.  Objective:   Vitals:   05/09/24 2100 05/09/24 2345 05/10/24 0751 05/10/24 1051  BP: 100/71 101/61 (!) 90/56 (!) 94/52  Pulse: 73 77 80 84  Resp: (!) 23 20 15 20   Temp:  98.4 F (36.9 C) 98.7 F (37.1 C) 98 F (36.7 C)  TempSrc:  Oral Oral Axillary  SpO2: 94% 91% 92% 90%  Weight:      Height:        General exam: Middle-age female, moderately built and obese Comfortably in bed without distress. Respiratory system: Clear to auscultation.  No increased work of breathing. Cardiovascular system: S1 & S2 heard, RRR. No JVD, murmurs, rubs, gallops or clicks. No pedal edema.  Telemetry personally reviewed: Sinus rhythm. Gastrointestinal system: Abdomen is nondistended, soft and nontender. No organomegaly or masses felt. Normal bowel sounds heard. Central nervous system: Mentation as noted above. No focal neurological deficits. Extremities: Symmetric 5 x 5 power. Skin: No rashes, lesions or ulcers Psychiatry: Judgement and insight impaired. Mood & affect cannot be assessed.    Data Reviewed:   I have personally reviewed following labs and imaging studies   CBC: Recent Labs  Lab 05/08/24 0820 05/08/24 0827 05/09/24 0454 05/10/24 0311  WBC 11.5*  --  6.7 6.2  NEUTROABS 9.1*  --   --   --   HGB 11.0* 10.9* 10.2* 10.7*  HCT 32.3* 32.0* 29.4* 31.4*  MCV 94.4  --  92.7 93.2  PLT 270  --  284 298    Basic Metabolic Panel: Recent Labs  Lab 05/08/24 0820 05/08/24 0827  05/08/24 1246 05/08/24 1700 05/09/24 0454 05/09/24 1457 05/10/24 0311  NA 122* 123* 129* 129* 131*  --  131*  K 4.1 4.2 3.6 3.7 3.4*  --  3.8  CL 87* 86* 93* 94* 93*  --  95*  CO2 23  --  25 23 25   --  24  GLUCOSE 128* 124* 117* 110* 104*  --  113*  BUN 29* 33* 25* 21 14  --  12  CREATININE 1.52* 1.60* 0.95 1.03* 0.84  --  0.82  CALCIUM  9.8  --  9.2 9.2 9.4  --  9.6  MG  --   --   --   --   --  1.4* 2.5*    Liver Function Tests: Recent Labs  Lab 05/08/24 0820 05/09/24 0454  AST 39 39  ALT 22 20  ALKPHOS 96 93  BILITOT 0.5 0.8  PROT 5.3* 5.1*  ALBUMIN 2.5* 2.3*    CBG: No results for input(s): GLUCAP in the last 168 hours.  Microbiology Studies:  No results found for this or any previous visit (from the past 240 hours).  Radiology Studies:  No results found.   Scheduled  Meds:    budesonide   9 mg Oral Daily   celecoxib   200 mg Oral Daily   cyanocobalamin  1,000 mcg Oral q AM   lactose free nutrition  237 mL Oral Daily   levothyroxine   100 mcg Oral Q0600   midodrine  10 mg Oral TID WC   multivitamin with minerals  1 tablet Oral q AM   rosuvastatin   40 mg Oral Daily    Continuous Infusions:    thiamine (VITAMIN B1) injection       LOS: 2 days     Trenda Mar, MD,  FACP, [facility], [facility]   [facility] [place]      To contact the attending provider between 7A-7P or the covering provider during after hours 7P-7A, please log into the web site [email or web address] and access using universal  password for that web site. If you do not have the password, please call the hospital operator.  05/10/2024, 2:23 PM

## 2024-05-10 NOTE — Plan of Care (Signed)
   Problem: Education: Goal: Knowledge of General Education information will improve Description Including pain rating scale, medication(s)/side effects and non-pharmacologic comfort measures Outcome: Progressing   Problem: Health Behavior/Discharge Planning: Goal: Ability to manage health-related needs will improve Outcome: Progressing

## 2024-05-10 NOTE — Progress Notes (Signed)
 NEUROSURGERY PROGRESS NOTE  Doing well. Complains of appropriate back soreness. No leg pain No numbness, tingling or weakness Ambulating and voiding well Good strength and sensation Incision CDI  Temp:  [97.9 F (36.6 C)-98.7 F (37.1 C)] 98.7 F (37.1 C) (11/15 0751) Pulse Rate:  [73-80] 80 (11/15 0751) Resp:  [15-33] 15 (11/15 0751) BP: (80-144)/(47-134) 90/56 (11/15 0751) SpO2:  [88 %-94 %] 92 % (11/15 0751)  Plan: Awaiting rehab placement  Suzen Chiquita Pean, NP 05/10/2024 10:21 AM

## 2024-05-11 DIAGNOSIS — E871 Hypo-osmolality and hyponatremia: Secondary | ICD-10-CM | POA: Diagnosis not present

## 2024-05-11 DIAGNOSIS — G9341 Metabolic encephalopathy: Secondary | ICD-10-CM | POA: Diagnosis not present

## 2024-05-11 LAB — COMPREHENSIVE METABOLIC PANEL WITH GFR
ALT: 20 U/L (ref 0–44)
AST: 32 U/L (ref 15–41)
Albumin: 2.4 g/dL — ABNORMAL LOW (ref 3.5–5.0)
Alkaline Phosphatase: 96 U/L (ref 38–126)
Anion gap: 10 (ref 5–15)
BUN: 11 mg/dL (ref 8–23)
CO2: 28 mmol/L (ref 22–32)
Calcium: 9.6 mg/dL (ref 8.9–10.3)
Chloride: 95 mmol/L — ABNORMAL LOW (ref 98–111)
Creatinine, Ser: 0.8 mg/dL (ref 0.44–1.00)
GFR, Estimated: >60 mL/min (ref >60)
Glucose, Bld: 116 mg/dL — ABNORMAL HIGH (ref 70–99)
Potassium: 3.9 mmol/L (ref 3.5–5.1)
Sodium: 133 mmol/L — ABNORMAL LOW (ref 135–145)
Total Bilirubin: 0.7 mg/dL (ref 0.0–1.2)
Total Protein: 5.3 g/dL — ABNORMAL LOW (ref 6.5–8.1)

## 2024-05-11 LAB — CBC
HCT: 31.4 % — ABNORMAL LOW (ref 36.0–46.0)
Hemoglobin: 10.9 g/dL — ABNORMAL LOW (ref 12.0–15.0)
MCH: 32.2 pg (ref 26.0–34.0)
MCHC: 34.7 g/dL (ref 30.0–36.0)
MCV: 92.6 fL (ref 80.0–100.0)
Platelets: 305 K/uL (ref 150–400)
RBC: 3.39 MIL/uL — ABNORMAL LOW (ref 3.87–5.11)
RDW: 12.8 % (ref 11.5–15.5)
WBC: 6 K/uL (ref 4.0–10.5)
nRBC: 0 % (ref 0.0–0.2)

## 2024-05-11 LAB — AMMONIA: Ammonia: 28 umol/L (ref 9–35)

## 2024-05-11 MED ORDER — COSYNTROPIN 0.25 MG IJ SOLR
0.2500 mg | Freq: Once | INTRAMUSCULAR | Status: AC
  Administered 2024-05-12: 0.25 mg via INTRAVENOUS
  Filled 2024-05-11: qty 0.25

## 2024-05-11 NOTE — Progress Notes (Signed)
 ACTH cortisol lab test unable to be done. Lab supervisor unabe to have a phlebotomist at that time to draw follow up lab

## 2024-05-11 NOTE — Plan of Care (Signed)
   Problem: Education: Goal: Knowledge of General Education information will improve Description Including pain rating scale, medication(s)/side effects and non-pharmacologic comfort measures Outcome: Progressing   Problem: Health Behavior/Discharge Planning: Goal: Ability to manage health-related needs will improve Outcome: Progressing

## 2024-05-11 NOTE — Progress Notes (Signed)
 NEUROSURGERY PROGRESS NOTE  Doing well, sitting up in chair eating. Continue pain management. Waiting for rehab placement  Temp:  [97.6 F (36.4 C)-98.8 F (37.1 C)] 98.7 F (37.1 C) (11/16 0728) Pulse Rate:  [68-79] 77 (11/16 0728) Resp:  [14-20] 19 (11/16 0728) BP: (101-145)/(61-79) 141/77 (11/16 0728) SpO2:  [90 %-96 %] 90 % (11/16 0728)    Suzen Chiquita Pean, NP 05/11/2024 11:56 AM

## 2024-05-11 NOTE — Plan of Care (Signed)
  Problem: Education: Goal: Knowledge of General Education information will improve Description: Including pain rating scale, medication(s)/side effects and non-pharmacologic comfort measures 05/11/2024 1135 by Lynnette Cena CROME, RN Outcome: Progressing 05/11/2024 1125 by Lynnette Cena CROME, RN Outcome: Progressing   Problem: Health Behavior/Discharge Planning: Goal: Ability to manage health-related needs will improve 05/11/2024 1135 by Lynnette Cena CROME, RN Outcome: Progressing 05/11/2024 1125 by Lynnette Cena CROME, RN Outcome: Progressing

## 2024-05-11 NOTE — Progress Notes (Signed)
 Patient ID: Jenny Yates, female   DOB: Jan 10, 1961, 63 y.o.   MRN: [id number] Overall stable.  Not much in the way of back pain.  Continue therapy.  Awaiting placement.

## 2024-05-11 NOTE — Progress Notes (Signed)
 PROGRESS NOTE   Jenny Yates  FMW:[id number]    DOB: Jun 02, 1961    DOA: 05/08/2024  PCP: Fleeta Valeria Mayo, MD   I have briefly reviewed patients previous medical records in [facility].   Brief Hospital Course:  63 year old female, lives with her spouse who is a retired Insurance Underwriter, medical history significant for HTN, anxiety/depression, chronic pain, s/p L4-L5 fusion on 04/23/2024 who presented to the ED following mechanical fall at home, confusion, noted to have hypotension, hyponatremia, acute kidney injury in the setting of dehydration.  AKI resolved.  Hypotension better.  Ongoing confusion.   Assessment & Plan:   Mechanical fall at home Physical deconditioning Recent spine surgery Apart from suspicious nondisplaced fracture of left L4 transverse process, extensive imaging below without acute findings. Fall likely multifactorial due to dehydration, hypotension, polypharmacy. Therapies have recommended AIR, CIR following.  Dehydration with hyponatremia Secondary to poor oral intake at home, and HCTZ. Presented with serum sodium of 122. Post bolus IV fluids and brief maintenance IV fluids (stopped on hospital day 1), serum sodium has been stable in the low 130s.  Ongoing poor oral intake, encourage p.o. intake.  Hesitant to add Remeron due to ongoing issues with AMS.  Stable.  Likely DC HCTZ at DC.  Hypokalemia Hypomagnesemia Replaced.  Hypotension Not sure if all of this is due to volume depletion. Holding antihypertensives (PTA meds: Hydralazine  50 mg twice daily, HCTZ 25 mg daily, Bystolic  20 mg nightly, Benicar  20 mg twice daily) S/p 1 L IV fluid bolus on admission and hospital day 2.  Had several normal BPs overnight and into this morning.  A.m. midodrine was held.  Subsequent BP in the 70s, then got midodrine. Continue midodrine 10 mg 3 times daily.  A.m. cortisol 1.5/low.  Had ordered an ACTH stim test but was not done this morning, hopefully can get it done  tomorrow.  Acute metabolic encephalopathy Multifactorial due to polypharmacy, dehydration and AKI.  As per discussion with spouse, history of significant alcohol use disorder in the recent past. CT head without acute findings.  MRI brain without acute stroke or acute findings. Delirium precautions.  Minimize opioids and sedatives.  On reduced dose Xanax  here-per patient report, has not really taken much of it over the last 2 weeks.  Discontinued Flexeril.  Monitor. Check thiamine-pending, B12 levels/2288.  Started high-dose IV thiamine, day 2 of 3. Given history of snoring, VBG without CO2 retention.  Ammonia level normal.  Has not gotten any opioids over the last 48 hours Ongoing confusion, etiology not really clear.  Essential hypertension See discussion above under hypotension.  Alcohol use disorder Per spouse's report, patient used to drink 4 to 5 glasses of red wine daily prior to recent spine surgery but since then has been drinking maybe half a glass of wine daily.  No overt withdrawal.  Check CIWA scores and only consider Ativan if consistently high.  S/p L4-5 fusion 04/23/2024 Extensive CT T/L/C-spine as noted below, suspicious for nondisplaced fracture of left L4 transverse process. Neurosurgery follow-up appreciated.  Acute blood loss anemia Hemoglobin 13.7 on 10/27.  Hemoglobin stable in the 10 g range for the last 3 days.  Hypothyroidism Synthroid .  TSH in June normal.   Vitamin B12 deficiency Continue supplementation, B12 levels as above.   Anxiety disorder/mood disorder As needed Xanax -has not received any here and was not getting much of it at home in the last 2 weeks.  Prior to that was taking more regularly.   Collagenous  colitis No diarrhea Continue budesonide   13 x 6 mm ground glass nodule in the right lung apex Noted on CT C/A/P 11/13 Per recommendations, chest CT at 6 and 12 months to confirm persistence, if persistent then CT every 2 years until 5 years.   If growth or solid components develops, consider resection.  Body mass index is 41.47 kg/m.  Class III obesity Complicates care.  Outpatient follow-up.   DVT prophylaxis: SCDs Start: 05/08/24 1236     Code Status: Full Code:  Family Communication: None at bedside this morning. Disposition:  Status is: Inpatient Remains inpatient appropriate because: Ongoing mental status changes.  ACTH stim test.  Awaiting CIR.     Consultants:   Neurosurgery.  Procedures:     Subjective:  May be a little more coherent this morning.  Alert, oriented to self, hospital.  Nursing and patient both reported facial redness and warmth but no fever.  No other complaints.  Objective:   Vitals:   05/11/24 0303 05/11/24 0505 05/11/24 0728 05/11/24 1100  BP: (!) 144/77 128/75 (!) 141/77 (!) 76/45  Pulse: 79 74 77 81  Resp: 14  19 20   Temp: 97.6 F (36.4 C) 98.8 F (37.1 C) 98.7 F (37.1 C) 98.4 F (36.9 C)  TempSrc: Axillary Oral Oral Oral  SpO2: 92% 91% 90% 94%  Weight:      Height:        General exam: Middle-age female, moderately built and obese sitting up comfortably in bed. Respiratory system: Clear to auscultation.  No increased work of breathing. Cardiovascular system: S1 & S2 heard, RRR. No JVD, murmurs, rubs, gallops or clicks. No pedal edema.  Telemetry personally reviewed: Sinus rhythm. Gastrointestinal system: Abdomen is nondistended, soft and nontender. No organomegaly or masses felt. Normal bowel sounds heard. Central nervous system: Mentation as noted above. No focal neurological deficits. Extremities: Symmetric 5 x 5 power. Skin: Flushed bilateral cheeks and forehead.  Does not appear to be a rash. Psychiatry: Judgement and insight impaired. Mood & affect cannot be assessed.    Data Reviewed:   I have personally reviewed following labs and imaging studies   CBC: Recent Labs  Lab 05/08/24 0820 05/08/24 0827 05/09/24 0454 05/10/24 0311 05/11/24 0811  WBC 11.5*  --   6.7 6.2 6.0  NEUTROABS 9.1*  --   --   --   --   HGB 11.0*   < > 10.2* 10.7* 10.9*  HCT 32.3*   < > 29.4* 31.4* 31.4*  MCV 94.4  --  92.7 93.2 92.6  PLT 270  --  284 298 305   < > = values in this interval not displayed.    Basic Metabolic Panel: Recent Labs  Lab 05/08/24 1246 05/08/24 1700 05/09/24 0454 05/09/24 1457 05/10/24 0311 05/11/24 0811  NA 129* 129* 131*  --  131* 133*  K 3.6 3.7 3.4*  --  3.8 3.9  CL 93* 94* 93*  --  95* 95*  CO2 25 23 25   --  24 28  GLUCOSE 117* 110* 104*  --  113* 116*  BUN 25* 21 14  --  12 11  CREATININE 0.95 1.03* 0.84  --  0.82 0.80  CALCIUM  9.2 9.2 9.4  --  9.6 9.6  MG  --   --   --  1.4* 2.5*  --     Liver Function Tests: Recent Labs  Lab 05/08/24 0820 05/09/24 0454 05/11/24 0811  AST 39 39 32  ALT 22 20 20  ALKPHOS 96 93 96  BILITOT 0.5 0.8 0.7  PROT 5.3* 5.1* 5.3*  ALBUMIN 2.5* 2.3* 2.4*    CBG: No results for input(s): GLUCAP in the last 168 hours.  Microbiology Studies:  No results found for this or any previous visit (from the past 240 hours).  Radiology Studies:  MR BRAIN WO CONTRAST Result Date: 05/10/2024 EXAM: MRI BRAIN WITHOUT CONTRAST 05/10/2024 01:34:00 PM TECHNIQUE: Multiplanar multisequence MRI of the head/brain was performed without the administration of intravenous contrast. COMPARISON: None available. CLINICAL HISTORY: Mental status change, unknown cause. FINDINGS: BRAIN AND VENTRICLES: Moderate generalized atrophy and white matter changes are advanced for age. White matter changes extend into the brainstem. No acute infarct. No intracranial hemorrhage. No mass. No midline shift. No hydrocephalus. The sella is unremarkable. Normal flow voids. ORBITS: No acute abnormality. SINUSES AND MASTOIDS: No acute abnormality. BONES AND SOFT TISSUES: Normal marrow signal. No acute soft tissue abnormality. IMPRESSION: 1. No acute findings. 2. Moderate generalized atrophy and advanced-for-age white matter changes extending  into the brainstem. Electronically signed by: Lonni Necessary MD 05/10/2024 02:33 PM EST RP Workstation: [id number]     Scheduled Meds:    budesonide   9 mg Oral Daily   celecoxib   200 mg Oral Daily   cosyntropin  0.25 mg Intravenous Once   cyanocobalamin  1,000 mcg Oral q AM   lactose free nutrition  237 mL Oral Daily   levothyroxine   100 mcg Oral Q0600   midodrine  10 mg Oral TID WC   multivitamin with minerals  1 tablet Oral q AM   rosuvastatin   40 mg Oral Daily    Continuous Infusions:    thiamine (VITAMIN B1) injection 500 mg (05/11/24 1059)     LOS: 3 days     Jenny Mar, MD,  FACP, [facility], [facility], [facility]   [facility] [place]      To contact the attending provider between 7A-7P or the covering provider during after hours 7P-7A, please log into the web site [email or web address] and access using universal [place] password for that web site. If you do not have the password, please call the hospital operator.  05/11/2024, 1:18 PM

## 2024-05-12 ENCOUNTER — Ambulatory Visit: Admitting: Dermatology

## 2024-05-12 DIAGNOSIS — G9341 Metabolic encephalopathy: Secondary | ICD-10-CM | POA: Diagnosis not present

## 2024-05-12 DIAGNOSIS — S32009D Unspecified fracture of unspecified lumbar vertebra, subsequent encounter for fracture with routine healing: Secondary | ICD-10-CM

## 2024-05-12 DIAGNOSIS — E871 Hypo-osmolality and hyponatremia: Secondary | ICD-10-CM | POA: Diagnosis not present

## 2024-05-12 LAB — ACTH STIMULATION, 3 TIME POINTS
Cortisol, 30 Min: 5 ug/dL
Cortisol, Base: <0.4 ug/dL

## 2024-05-12 NOTE — Progress Notes (Signed)
 Occupational Therapy Treatment Patient Details Name: Jenny Yates MRN: [id number] DOB: Jun 29, 1960 Today's Date: 05/12/2024   History of present illness Jenny Yates is a 63 y.o. female who presented 05/08/24 after a fall out of bed, decreased intake and severe generalized weakness. Found to have left L4 transverse process fracture and hypotension/AKI/hyponatremia in setting of dehydration. PMH significant for 10/29 L4-5 PLIF, HTN, hypothyroidism   OT comments  Pt is making steady progress towards their acute OT goals. Overall pt's cognition has improved but she continues to have notable moments of confusion and needed cues for safety and spinal precautions. Pt took a shower with mod A for LB bathing and dressing and min A for UB tasks. OT to continue to follow acutely to facilitate progress towards established goals. Pt will continue to benefit from intensive inpatient follow up therapy, >3 hours/day after discharge.        If plan is discharge home, recommend the following:  A lot of help with walking and/or transfers;A lot of help with bathing/dressing/bathroom;Assistance with cooking/housework;Assist for transportation;Help with stairs or ramp for entrance      Recommendations for Other Services Rehab consult    Precautions / Restrictions Precautions Precautions: Fall;Back Precaution Booklet Issued: No Recall of Precautions/Restrictions: Intact Precaution/Restrictions Comments: watch BP (soft) Required Braces or Orthoses: Spinal Brace Spinal Brace: Lumbar corset;Applied in sitting position Restrictions Weight Bearing Restrictions Per Provider Order: No       Mobility Bed Mobility               General bed mobility comments: OOB on arrival    Transfers Overall transfer level: Needs assistance Equipment used: Rolling walker (2 wheels) Transfers: Sit to/from Stand Sit to Stand: Contact guard assist           General transfer comment: cues needed for hand  placement     Balance Overall balance assessment: Needs assistance Sitting-balance support: Feet supported, No upper extremity supported Sitting balance-Leahy Scale: Fair     Standing balance support: Bilateral upper extremity supported, During functional activity, Reliant on assistive device for balance Standing balance-Leahy Scale: Poor                             ADL either performed or assessed with clinical judgement   ADL Overall ADL's : Needs assistance/impaired         Upper Body Bathing: Minimal assistance;Sitting   Lower Body Bathing: Moderate assistance;Sitting/lateral leans;Sit to/from stand Lower Body Bathing Details (indicate cue type and reason): assist for peri area and feet Upper Body Dressing : Set up;Sitting   Lower Body Dressing: Total assistance Lower Body Dressing Details (indicate cue type and reason): total A for socks Toilet Transfer: Contact guard assist;Ambulation Toilet Transfer Details (indicate cue type and reason): functional ambulation WFL with RW, cues for hand placement         Functional mobility during ADLs: Contact guard assist;Rolling walker (2 wheels) General ADL Comments: continues to needs assist for LB tasks and cognition. Pt took a full shower with assist, she did dequence through most ADLs WFL but needed cues for safety    Extremity/Trunk Assessment Upper Extremity Assessment Upper Extremity Assessment: Generalized weakness   Lower Extremity Assessment Lower Extremity Assessment: Defer to PT evaluation        Vision   Vision Assessment?: No apparent visual deficits   Perception Perception Perception: Not tested   Praxis Praxis Praxis: Not tested   Communication Communication  Communication: No apparent difficulties   Cognition Arousal: Alert Behavior During Therapy: WFL for tasks assessed/performed Cognition: Cognition impaired     Awareness: Online awareness impaired Memory impairment (select all  impairments): Short-term memory, Working civil service fast streamer, Conservation officer, historic buildings Attention impairment (select first level of impairment): Selective attention Executive functioning impairment (select all impairments): Organization, Sequencing, Reasoning, Problem solving OT - Cognition Comments: pt's cognition was notably improved however she continues to have moments of confusion, she sequenced through ADLs WTF                 Following commands: Impaired Following commands impaired: Only follows one step commands consistently, Follows multi-step commands inconsistently      Cueing   Cueing Techniques: Verbal cues, Tactile cues  Exercises      Shoulder Instructions       General Comments VSS on RA, IV covered for shower    Pertinent Vitals/ Pain       Pain Assessment Pain Assessment: No/denies pain   Frequency  Min 2X/week        Progress Toward Goals  OT Goals(current goals can now be found in the care plan section)  Progress towards OT goals: Progressing toward goals  Acute Rehab OT Goals Patient Stated Goal: to go to rehab OT Goal Formulation: With patient Time For Goal Achievement: 05/23/24 Potential to Achieve Goals: Good   AM-PAC OT 6 Clicks Daily Activity     Outcome Measure   Help from another person eating meals?: A Little Help from another person taking care of personal grooming?: A Little Help from another person toileting, which includes using toliet, bedpan, or urinal?: A Little Help from another person bathing (including washing, rinsing, drying)?: A Lot Help from another person to put on and taking off regular upper body clothing?: A Little Help from another person to put on and taking off regular lower body clothing?: A Lot 6 Click Score: 16    End of Session Equipment Utilized During Treatment: Rolling walker (2 wheels);Back brace  OT Visit Diagnosis: Unsteadiness on feet (R26.81);Other abnormalities of gait and mobility (R26.89);Muscle  weakness (generalized) (M62.81);History of falling (Z91.81);Pain   Activity Tolerance Patient tolerated treatment well   Patient Left in chair;with call bell/phone within reach;with family/visitor present   Nurse Communication Mobility status        Time: 1019-1110 OT Time Calculation (min): 51 min  Charges: OT General Charges $OT Visit: 1 Visit OT Treatments $Self Care/Home Management : 38-52 mins  Lucie Kendall, OTR/L Acute Rehabilitation Services Office [phone] Secure Chat Communication Preferred   Lucie JONETTA Kendall 05/12/2024, 11:30 AM

## 2024-05-12 NOTE — Progress Notes (Signed)
 Attempted to call patient spouse to notify him of transfer, room change to 5C07.

## 2024-05-12 NOTE — Progress Notes (Signed)
 PROGRESS NOTE   Jenny Yates  FMW:[id number]    DOB: 1960-10-31    DOA: 05/08/2024  PCP: Fleeta Valeria Mayo, MD   I have briefly reviewed patients previous medical records in [facility].   Brief Hospital Course:  63 year old female, lives with her spouse who is a retired Insurance Underwriter, medical history significant for HTN, anxiety/depression, chronic pain, s/p L4-L5 fusion on 04/23/2024 who presented to the ED following mechanical fall at home, confusion, noted to have hypotension, hyponatremia, acute kidney injury in the setting of dehydration.  AKI and hypotension resolved.  AMS somewhat better but still having some waxing and waning confusion.  Overall medically optimized for DC to next level of care i.e. CIR and can be monitored there.   Assessment & Plan:   Mechanical fall at home Physical deconditioning Recent spine surgery Apart from suspicious nondisplaced fracture of left L4 transverse process, extensive imaging below without acute findings. Fall likely multifactorial due to dehydration, hypotension, polypharmacy. Therapies have recommended AIR, CIR following.  Discussed with Dr. Babs, physiatry.  They do not have any beds today.  Moreover, waiting on PT reevaluation and insurance authorization.  Dehydration with hyponatremia Secondary to poor oral intake at home, and HCTZ. Presented with serum sodium of 122. Post bolus IV fluids and brief maintenance IV fluids (stopped on hospital day 1), serum sodium has been stable in the low 130s.  Per spouse, oral intake has gradually improved.  Hesitant to add Remeron due to ongoing issues with AMS.  Stable.  Likely DC HCTZ at DC.  Hypokalemia Hypomagnesemia Replaced.  Hypotension Not sure if all of this is due to volume depletion. Holding antihypertensives (PTA meds: Hydralazine  50 mg twice daily, HCTZ 25 mg daily, Bystolic  20 mg nightly, Benicar  20 mg twice daily) For the first 48 hours, had hypotension with SBP in the 80s.   Midodrine had been added on admission.  However over the last >24 hours, blood pressures have normalized and even trending towards mild hypertension. Unclear if the prolonged hypotension on admission was due to residual effect of polypharmacy antihypertensives that she was on PTA.  Discontinued midodrine. ACTH stim test drawn this morning, results pending.  Even if positive, not sure would start hydrocortisone given hypertensive range BPs.  Acute metabolic encephalopathy Multifactorial due to polypharmacy, dehydration and AKI.  As per discussion with spouse, history of significant alcohol use disorder in the recent past. CT head without acute findings.  MRI brain without acute stroke or acute findings. Delirium precautions.  Minimize opioids and sedatives.  On reduced dose Xanax  here-per patient report, has not really taken much of it over the last 2 weeks.  Discontinued Flexeril.  Monitor. Check thiamine-pending today, B12 levels/2288.  Started high-dose IV thiamine, day 3 of 3 followed by 100 mg daily by mouth. Given history of snoring, VBG without CO2 retention.  Ammonia level normal.  Has not gotten any opioids since a single dose on 11/13 night. UA with rare bacteria and significant pyuria but no UTI symptoms.  After detailed discussion with spouse, monitoring off of antibiotics. Per spouse, mental status was significantly better 11/16 in the daytime and then progressively declined towards the evening.  This morning again more confused.  Appears to be having waxing and waning mental status changes.  Unclear etiology.  Consider outpatient neurocognitive consultation with neurology.  Essential hypertension See discussion above under hypotension.  Continue to hold antihypertensives for now.  Alcohol use disorder Per spouse's report, patient used to drink 4 to  5 glasses of red wine daily prior to recent spine surgery but since then has been drinking maybe half a glass of wine daily.  No alcohol  withdrawal noted.  S/p L4-5 fusion 04/23/2024 Extensive CT T/L/C-spine as noted below, suspicious for nondisplaced fracture of left L4 transverse process. Neurosurgery to follow hospital course.  No recommendations except pursuing rehab.  Acute blood loss anemia Hemoglobin 13.7 on 10/27.  Hemoglobin stable in the 10 g range for the last 3 days.  Hypothyroidism Synthroid .  TSH in June normal.   Vitamin B12 deficiency Continue supplementation, B12 levels as above.  Could consider reducing B12 supplements at DC.   Anxiety disorder/mood disorder As needed Xanax -has not received any here and was not getting much of it at home in the last 2 weeks.  Prior to that was taking more regularly.   Collagenous colitis No diarrhea Continue budesonide   13 x 6 mm ground glass nodule in the right lung apex Noted on CT C/A/P 11/13 Per recommendations, chest CT at 6 and 12 months to confirm persistence, if persistent then CT every 2 years until 5 years.  If growth or solid components develops, consider resection.  Body mass index is 41.47 kg/m.  Class III obesity Complicates care.  Outpatient follow-up.   DVT prophylaxis: SCDs Start: 05/08/24 1236     Code Status: Full Code:  Family Communication: Spouse at bedside. Disposition:  Status is: Inpatient Remains inpatient appropriate because: Ongoing mental status changes.  ACTH stim test.  Awaiting CIR.     Consultants:   Neurosurgery. Physiatry  Procedures:     Subjective:  Interviewed and examined patient along with spouse at bedside.  Patient alert and oriented to self, after a lot of effort, was able to finally say that she is at: Hospital.  Still confused this morning.  Objective:   Vitals:   05/11/24 2339 05/12/24 0314 05/12/24 0727 05/12/24 1149  BP: (!) 164/87 (!) 171/86 (!) 151/78 117/73  Pulse: 73 76    Resp: (!) 25 19 14 19   Temp: 98.3 F (36.8 C) 99.2 F (37.3 C) 97.7 F (36.5 C) 98.1 F (36.7 C)  TempSrc: Oral Oral  Oral   SpO2: 90% 100% 100%   Weight:      Height:        General exam: Middle-age female, moderately built and obese sitting up comfortably in chair.  Spouse at bedside. Respiratory system: Clear to auscultation.  No increased work of breathing. Cardiovascular system: S1 & S2 heard, RRR. No JVD, murmurs, rubs, gallops or clicks. No pedal edema.  Telemetry personally reviewed: Sinus rhythm.  Discontinued telemetry and downgraded to medical bed. Gastrointestinal system: Abdomen is nondistended, soft and nontender. No organomegaly or masses felt. Normal bowel sounds heard. Central nervous system: Mentation as noted above. No focal neurological deficits. Extremities: Symmetric 5 x 5 power. Skin: Flushed bilateral cheeks and forehead.  Does not appear to be a rash. Psychiatry: Judgement and insight impaired. Mood & affect pleasantly confused but not agitated.    Data Reviewed:   I have personally reviewed following labs and imaging studies   CBC: Recent Labs  Lab 05/08/24 0820 05/08/24 0827 05/09/24 0454 05/10/24 0311 05/11/24 0811  WBC 11.5*  --  6.7 6.2 6.0  NEUTROABS 9.1*  --   --   --   --   HGB 11.0*   < > 10.2* 10.7* 10.9*  HCT 32.3*   < > 29.4* 31.4* 31.4*  MCV 94.4  --  92.7 93.2  92.6  PLT 270  --  284 298 305   < > = values in this interval not displayed.    Basic Metabolic Panel: Recent Labs  Lab 05/08/24 1246 05/08/24 1700 05/09/24 0454 05/09/24 1457 05/10/24 0311 05/11/24 0811  NA 129* 129* 131*  --  131* 133*  K 3.6 3.7 3.4*  --  3.8 3.9  CL 93* 94* 93*  --  95* 95*  CO2 25 23 25   --  24 28  GLUCOSE 117* 110* 104*  --  113* 116*  BUN 25* 21 14  --  12 11  CREATININE 0.95 1.03* 0.84  --  0.82 0.80  CALCIUM  9.2 9.2 9.4  --  9.6 9.6  MG  --   --   --  1.4* 2.5*  --     Liver Function Tests: Recent Labs  Lab 05/08/24 0820 05/09/24 0454 05/11/24 0811  AST 39 39 32  ALT 22 20 20   ALKPHOS 96 93 96  BILITOT 0.5 0.8 0.7  PROT 5.3* 5.1* 5.3*  ALBUMIN  2.5* 2.3* 2.4*    CBG: No results for input(s): GLUCAP in the last 168 hours.  Microbiology Studies:  No results found for this or any previous visit (from the past 240 hours).  Radiology Studies:  MR BRAIN WO CONTRAST Result Date: 05/10/2024 EXAM: MRI BRAIN WITHOUT CONTRAST 05/10/2024 01:34:00 PM TECHNIQUE: Multiplanar multisequence MRI of the head/brain was performed without the administration of intravenous contrast. COMPARISON: None available. CLINICAL HISTORY: Mental status change, unknown cause. FINDINGS: BRAIN AND VENTRICLES: Moderate generalized atrophy and white matter changes are advanced for age. White matter changes extend into the brainstem. No acute infarct. No intracranial hemorrhage. No mass. No midline shift. No hydrocephalus. The sella is unremarkable. Normal flow voids. ORBITS: No acute abnormality. SINUSES AND MASTOIDS: No acute abnormality. BONES AND SOFT TISSUES: Normal marrow signal. No acute soft tissue abnormality. IMPRESSION: 1. No acute findings. 2. Moderate generalized atrophy and advanced-for-age white matter changes extending into the brainstem. Electronically signed by: Lonni Necessary MD 05/10/2024 02:33 PM EST RP Workstation: [id number]     Scheduled Meds:    budesonide   9 mg Oral Daily   celecoxib   200 mg Oral Daily   cyanocobalamin  1,000 mcg Oral q AM   lactose free nutrition  237 mL Oral Daily   levothyroxine   100 mcg Oral Q0600   multivitamin with minerals  1 tablet Oral q AM   rosuvastatin   40 mg Oral Daily    Continuous Infusions:       LOS: 4 days     Trenda Mar, MD,  FACP, [facility], [facility], [facility]   [facility] [place]      To contact the attending provider between 7A-7P or the covering provider during after hours 7P-7A, please log into the web site [email or web address] and access using universal [place] password for that web site. If you do not have the password, please call the hospital  operator.  05/12/2024, 12:58 PM

## 2024-05-12 NOTE — Plan of Care (Signed)

## 2024-05-12 NOTE — Progress Notes (Signed)
 Inpatient Rehab Admissions Coordinator:   CIR following.  Per medical team, stable for transfer. I will work on journalist, newspaper. She will need  updated PT note to gain approval but per acute therapy director, PT is unable to see Pt. Today due to staffing. They will see her tomorrow.   Leita Kleine, MS, CCC-SLP Rehab Admissions Coordinator  [phone] (celll) [phone] (office)

## 2024-05-12 NOTE — Progress Notes (Signed)
 Subjective: The patient is alert and pleasant.  She looks and feels much better.  She has no complaints.  Objective: Vital signs in last 24 hours: Temp:  [97.7 F (36.5 C)-99.2 F (37.3 C)] 97.7 F (36.5 C) (11/17 0727) Pulse Rate:  [67-81] 76 (11/17 0314) Resp:  [14-28] 14 (11/17 0727) BP: (76-171)/(45-87) 151/78 (11/17 0727) SpO2:  [90 %-100 %] 100 % (11/17 0727) Estimated body mass index is 41.47 kg/m as calculated from the following:   Height as of this encounter: 4' 10 (1.473 m).   Weight as of this encounter: 90 kg.   Intake/Output from previous day: 11/16 0701 - 11/17 0700 In: 496.3 [P.O.:477; IV Piggyback:19.3] Out: 1700 [Urine:1700] Intake/Output this shift: No intake/output data recorded.  Physical exam the patient is alert, pleasant and oriented.  Her strength is normal.  Her lumbar incision is healing well.  Lab Results: Recent Labs    05/10/24 0311 05/11/24 0811  WBC 6.2 6.0  HGB 10.7* 10.9*  HCT 31.4* 31.4*  PLT 298 305   BMET Recent Labs    05/10/24 0311 05/11/24 0811  NA 131* 133*  K 3.8 3.9  CL 95* 95*  CO2 24 28  GLUCOSE 113* 116*  BUN 12 11  CREATININE 0.82 0.80  CALCIUM  9.6 9.6    Studies/Results: MR BRAIN WO CONTRAST Result Date: 05/10/2024 EXAM: MRI BRAIN WITHOUT CONTRAST 05/10/2024 01:34:00 PM TECHNIQUE: Multiplanar multisequence MRI of the head/brain was performed without the administration of intravenous contrast. COMPARISON: None available. CLINICAL HISTORY: Mental status change, unknown cause. FINDINGS: BRAIN AND VENTRICLES: Moderate generalized atrophy and white matter changes are advanced for age. White matter changes extend into the brainstem. No acute infarct. No intracranial hemorrhage. No mass. No midline shift. No hydrocephalus. The sella is unremarkable. Normal flow voids. ORBITS: No acute abnormality. SINUSES AND MASTOIDS: No acute abnormality. BONES AND SOFT TISSUES: Normal marrow signal. No acute soft tissue abnormality.  IMPRESSION: 1. No acute findings. 2. Moderate generalized atrophy and advanced-for-age white matter changes extending into the brainstem. Electronically signed by: Lonni Necessary MD 05/10/2024 02:33 PM EST RP Workstation: [id number]    Assessment/Plan: Postop day #7: The patient is doing well.  We are awaiting rehab placement.  LOS: 4 days     Jenny Yates 05/12/2024, 8:06 AM     Patient ID: Jenny Yates, female   DOB: [birth year], 63 y.o.   MRN: [id number]

## 2024-05-12 NOTE — Consult Note (Signed)
 Physical Medicine and Rehabilitation Consult Reason for Consult: Impaired functional mobility Referring Physician: Judeth   HPI: Jenny Yates is a 63 y.o. female with a history of hypertension and chronic low back pain status post L4-L5 fusion on 04/23/2024 who presented on 05/08/2024 after a fall at home with confusion.  She was found to be dehydrated with acute kidney injury, hyponatremia.  She was given IV fluids with recovery.  Electrolytes were replaced.  Encephalopathy was felt to be multifactorial.  Imaging of the spine revealed a possible L4 transverse process fracture.  Conservative management was recommended by neurosurgery.  Patient was evaluated by therapies on Friday and was mod assist for sit to stand transfers and min assist 8 feet using a rolling walker.  She was up with Occupational Therapy this morning and was min to mod assist for basic ADLs.  Patient lives at home with her husband in a two-level house with 2 steps to enter.  She can stay on the first floor if needed.  Patient was walking with a rolling walker after initial back surgery, husband was assisting with transfers and mobility as well as ADLs during that period.   Home: Home Living Family/patient expects to be discharged to:: Private residence Living Arrangements: Spouse/significant other Available Help at Discharge: Family, Available 24 hours/day Type of Home: House Home Access: Stairs to enter [employer] of Steps: 2 (2 + a large threshold step) Entrance Stairs-Rails: Right, Left, Can reach both Home Layout: Two level, Able to live on main level with bedroom/bathroom Alternate Level Stairs-Number of Steps: flight Bathroom Shower/Tub: Engineer, Manufacturing Systems: Standard Bathroom Accessibility: Yes Home Equipment: Information systems manager, Grab bars - tub/shower, Medical Laboratory Scientific Officer - single point Additional Comments: spouse retired Insurance Underwriter  Lives With: Spouse  Functional History: Prior Function Prior  Level of Function : Independent/Modified Independent Mobility Comments: youth RW for all mobility since back surgery. pts husband assists with all transfers and mobility. Pt tolerates ~6ft. 4x/day per husband ADLs Comments: pt's husband assists with all aspects of ADLs, sponge bathing only since surgery. [name] husband also reports impaired cognition since surgery Functional Status:  Mobility: Bed Mobility General bed mobility comments: OOB on arrival Transfers Overall transfer level: Needs assistance Equipment used: Rolling walker (2 wheels) Transfers: Sit to/from Stand Sit to Stand: Contact guard assist General transfer comment: cues needed for hand placement Ambulation/Gait Ambulation/Gait assistance: Min assist Gait Distance (Feet): 8 Feet Assistive device: Rolling walker (2 wheels) Gait Pattern/deviations: Decreased stride length, Step-to pattern, Decreased step length - right, Decreased step length - left, Decreased dorsiflexion - right, Decreased dorsiflexion - left, Shuffle, Wide base of support General Gait Details: Pt with a very wide BOS and small steps with poor dorsiflexion, resulting in her sliding her bil feet to advance feet. Verbal and tactile cues needed to shift weight and coordinate steps and RW. MinA for balance. Pt fatigued quickly Gait velocity: reduced Gait velocity interpretation: <1.31 ft/sec, indicative of household ambulator    ADL: ADL Overall ADL's : Needs assistance/impaired Eating/Feeding: Minimal assistance, Sitting Grooming: Minimal assistance, Sitting Upper Body Bathing: Minimal assistance, Sitting Lower Body Bathing: Moderate assistance, Sitting/lateral leans, Sit to/from stand Lower Body Bathing Details (indicate cue type and reason): assist for peri area and feet Upper Body Dressing : Set up, Sitting Lower Body Dressing: Total assistance Lower Body Dressing Details (indicate cue type and reason): total A for socks Toilet Transfer: Contact guard  assist, Ambulation Toilet Transfer Details (indicate cue type and reason): functional  ambulation WFL with RW, cues for hand placement Toileting- Clothing Manipulation and Hygiene: Total assistance, Sit to/from stand Functional mobility during ADLs: Contact guard assist, Rolling walker (2 wheels) General ADL Comments: continues to needs assist for LB tasks and cognition. Pt took a full shower with assist, she did dequence through most ADLs WFL but needed cues for safety  Cognition: Cognition Orientation Level: Oriented X4, Other (comment) (intermittent confusion) Cognition Arousal: Alert Behavior During Therapy: WFL for tasks assessed/performed   Review of Systems  Constitutional:  Positive for malaise/fatigue. Negative for fever.  HENT: Negative.    Eyes: Negative.   Respiratory: Negative.    Cardiovascular: Negative.   Gastrointestinal: Negative.   Genitourinary: Negative.   Musculoskeletal:  Positive for back pain, falls and myalgias.  Skin: Negative.   Neurological:  Positive for weakness.  Psychiatric/Behavioral:  Positive for memory loss.    Past Medical History:  Diagnosis Date   Anemia    Anxiety    Collagenous colitis    Depression    Hyperlipidemia    Hypertension    Hypothyroidism    Lumbar herniated disc    Panic disorder    Past Surgical History:  Procedure Laterality Date   BACK SURGERY     2007   HERNIA REPAIR     TUBAL LIGATION     History reviewed. No pertinent family history. Social History:  reports that she has quit smoking. Her smoking use included cigarettes. She has never used smokeless tobacco. She reports that she does not currently use alcohol. She reports that she does not use drugs. Allergies:  Allergies  Allergen Reactions   Codeine Other (See Comments)    Syncope knocks me to the floor   Epinephrine Other (See Comments)    Oral seizure-like activity   Azithromycin Rash   Iodine Swelling and Rash   Medications Prior to  Admission  Medication Sig Dispense Refill   acetaminophen  (TYLENOL ) 500 MG tablet Take 500 mg by mouth in the morning, at noon, in the evening, and at bedtime. 9am, 3pm, 9pm, 3am     ALPRAZolam  (XANAX ) 0.5 MG tablet Take 1 tablet (0.5 mg total) by mouth 3 (three) times daily as needed. (Patient taking differently: Take 0.5 mg by mouth 3 (three) times daily.) 90 tablet 2   budesonide  (ENTOCORT EC ) 3 MG 24 hr capsule TAKE 3 CAPSULES BY MOUTH EVERY DAY 270 capsule 1   celecoxib  (CELEBREX ) 200 MG capsule Take 200 mg by mouth daily.     Cholecalciferol (VITAMIN D3 PO) Take 1,000 Units by mouth in the morning and at bedtime.     cyanocobalamin (VITAMIN B12) 1000 MCG tablet Take 1,000 mcg by mouth in the morning.     cyclobenzaprine (FLEXERIL) 5 MG tablet Take 1 tablet (5 mg total) by mouth 3 (three) times daily as needed for muscle spasms. (Patient taking differently: Take 5 mg by mouth 3 (three) times daily. 6am, 3pm, midnight) 30 tablet 0   diphenhydrAMINE HCl (BENADRYL PO) Take 1 tablet by mouth as needed (sleep).     hydrALAZINE  (APRESOLINE ) 50 MG tablet Take 1 tablet (50 mg total) by mouth in the morning and at bedtime. TAKE 1 TABLET 2 TIMES DAILYWITH FOOD 180 tablet 1   hydrochlorothiazide  (HYDRODIURIL ) 25 MG tablet TAKE 1 TABLET DAILY 90 tablet 1   lactose free nutrition (BOOST) LIQD Take 237 mLs by mouth daily.     levothyroxine  (SYNTHROID ) 100 MCG tablet Take 1 tablet by mouth once daily 30 tablet 0  Multiple Vitamin (MULTIVITAMIN WITH MINERALS) TABS tablet Take 1 tablet by mouth in the morning.     nebivolol  (BYSTOLIC ) 10 MG tablet Take 2 tablets (20 mg total) by mouth daily. (Patient taking differently: Take 20 mg by mouth at bedtime.) 180 tablet 1   olmesartan  (BENICAR ) 20 MG tablet Take 1 tablet (20 mg total) by mouth in the morning and at bedtime. 180 tablet 3   oxyCODONE-acetaminophen  (PERCOCET/ROXICET) 5-325 MG tablet Take 1-2 tablets by mouth every 6 (six) hours as needed for pain  (Patient taking differently: Take 1 tablet by mouth every 6 (six) hours as needed for moderate pain (pain score 4-6) or severe pain (pain score 7-10). 6am, noon, 6pm, midnight) 40 tablet 0   rosuvastatin  (CRESTOR ) 40 MG tablet Take 1 tablet (40 mg total) by mouth daily. (Patient taking differently: Take 40 mg by mouth at bedtime.) 30 tablet 3     Blood pressure (!) 151/78, pulse 76, temperature 97.7 F (36.5 C), temperature source Oral, resp. rate 14, height 4' 10 (1.473 m), weight 90 kg, SpO2 100%. Physical Exam Constitutional:      General: She is not in acute distress.    Appearance: She is obese.  HENT:     Head: Normocephalic and atraumatic.     Nose: Nose normal.     Mouth/Throat:     Mouth: Mucous membranes are moist.  Eyes:     Extraocular Movements: Extraocular movements intact.     Pupils: Pupils are equal, round, and reactive to light.  Cardiovascular:     Rate and Rhythm: Normal rate.  Pulmonary:     Effort: Pulmonary effort is normal.  Abdominal:     Palpations: Abdomen is soft.  Musculoskeletal:        General: Tenderness (Low back) present. Normal range of motion.     Cervical back: Normal range of motion.     Right lower leg: Edema present.     Left lower leg: Edema present.  Skin:    Comments: Back incision covered by LSO. Chronic stasis changes in both LE's.  Neurological:     Comments: Pt alert. Oriented to person, place, reason she's here, month/year. Delays when giving her address, difficulties with processing thoughts and following conversation. CN exam is non-focal. MMT: 4/5 prox BUE to 5/5 bilateral HI. BLE: 3-/5 HF, 3/5 KE and 4/5 ADF/PF. Sensory exam normal for light touch and pain in all 4 limbs. No limb ataxia or cerebellar signs. No abnormal tone appreciated.    Psychiatric:     Comments: A little flat, tangential at times. cooperative     Results for orders placed or performed during the hospital encounter of 05/08/24 (from the past 24 hours)   ACTH stimulation, 3 time points (baseline, 30 min, 60 min)     Status: None (Preliminary result)   Collection Time: 05/12/24  6:10 AM  Result Value Ref Range   Cortisol, Base <0.4 ug/dL   Cortisol, 30 Min 5.0 ug/dL   Cortisol, 60 Min PENDING ug/dL   MR BRAIN WO CONTRAST Result Date: 05/10/2024 EXAM: MRI BRAIN WITHOUT CONTRAST 05/10/2024 01:34:00 PM TECHNIQUE: Multiplanar multisequence MRI of the head/brain was performed without the administration of intravenous contrast. COMPARISON: None available. CLINICAL HISTORY: Mental status change, unknown cause. FINDINGS: BRAIN AND VENTRICLES: Moderate generalized atrophy and white matter changes are advanced for age. White matter changes extend into the brainstem. No acute infarct. No intracranial hemorrhage. No mass. No midline shift. No hydrocephalus. The sella is unremarkable. Normal flow  voids. ORBITS: No acute abnormality. SINUSES AND MASTOIDS: No acute abnormality. BONES AND SOFT TISSUES: Normal marrow signal. No acute soft tissue abnormality. IMPRESSION: 1. No acute findings. 2. Moderate generalized atrophy and advanced-for-age white matter changes extending into the brainstem. Electronically signed by: Lonni Necessary MD 05/10/2024 02:33 PM EST RP Workstation: [id number]    Assessment/Plan: Diagnosis: 63 year old female with history of L4-L5 PLIF on 04/23/2024 by Dr. Mavis.  She suffered a fall at home postoperatively likely due to dehydration and suffered an L4 transverse process fracture. Does the need for close, 24 hr/day medical supervision in concert with the patient's rehab needs make it unreasonable for this patient to be served in a less intensive setting? Yes Co-Morbidities requiring supervision/potential complications:  -Metabolic encephalopathy Prerenal azotemia/AKI -Hypertension- -postoperative pain -Acute blood loss anemia   Due to bladder management, bowel management, safety, skin/wound care, disease management, medication  administration, pain management, and patient education, does the patient require 24 hr/day rehab nursing? Yes Does the patient require coordinated care of a physician, rehab nurse, therapy disciplines of PT, OT, and potentiall SLP to address physical and functional deficits in the context of the above medical diagnosis(es)? Yes Addressing deficits in the following areas: balance, endurance, locomotion, strength, transferring, bowel/bladder control, bathing, dressing, feeding, grooming, toileting, cognition, and psychosocial support Can the patient actively participate in an intensive therapy program of at least 3 hrs of therapy per day at least 5 days per week? Yes The potential for patient to make measurable gains while on inpatient rehab is excellent Anticipated functional outcomes upon discharge from inpatient rehab are supervision  with PT, supervision and min assist with OT, modified independent with SLP. Estimated rehab length of stay to reach the above functional goals is: 9-12 days Anticipated discharge destination: Home Overall Rehab/Functional Prognosis: excellent  POST ACUTE RECOMMENDATIONS: This patient's condition is appropriate for continued rehabilitative care in the following setting: CIR Patient has agreed to participate in recommended program. Yes Note that insurance prior authorization may be required for reimbursement for recommended care.  Comment: Pt still with cognitive deficits in the areas of problem solving, memory, and attention. Husband can provide assistance at home. Rehab Admissions Coordinator to follow up.       I have personally performed a face to face diagnostic evaluation of this patient. Additionally, I have examined the patient's medical record including any pertinent labs and radiographic images.    Thanks,  Arthea ONEIDA Gunther, MD 05/12/2024

## 2024-05-13 DIAGNOSIS — G9341 Metabolic encephalopathy: Secondary | ICD-10-CM | POA: Diagnosis not present

## 2024-05-13 DIAGNOSIS — E871 Hypo-osmolality and hyponatremia: Secondary | ICD-10-CM | POA: Diagnosis not present

## 2024-05-13 LAB — VITAMIN B1: Vitamin B1 (Thiamine): 98.1 nmol/L (ref 66.5–200.0)

## 2024-05-13 MED ORDER — THIAMINE MONONITRATE 100 MG PO TABS
100.0000 mg | ORAL_TABLET | Freq: Every day | ORAL | Status: DC
  Administered 2024-05-13 – 2024-05-14 (×2): 100 mg via ORAL
  Filled 2024-05-13 (×2): qty 1

## 2024-05-13 NOTE — Plan of Care (Signed)

## 2024-05-13 NOTE — Progress Notes (Signed)
 PROGRESS NOTE   Jenny Yates  FMW:[id number]    DOB: Dec 23, 1960    DOA: 05/08/2024  PCP: Fleeta Valeria Mayo, MD   I have briefly reviewed patients previous medical records in [facility].   Brief Hospital Course:  63 year old female, lives with her spouse who is a retired Insurance Underwriter, medical history significant for HTN, anxiety/depression, chronic pain, s/p L4-L5 fusion on 04/23/2024 who presented to the ED following mechanical fall at home, confusion, noted to have hypotension, hyponatremia, acute kidney injury in the setting of dehydration.  AKI and hypotension resolved.  AMS somewhat better but still having some waxing and waning confusion.  Overall medically optimized for DC to next level of care i.e. CIR and can be monitored there.   Assessment & Plan:   Mechanical fall at home Physical deconditioning Recent spine surgery Apart from suspicious nondisplaced fracture of left L4 transverse process, extensive imaging on admission without acute findings. Fall likely multifactorial due to dehydration, hypotension, polypharmacy. Therapies have recommended AIR, CIR following.   Patient is medically optimized for DC to CIR.  They have started insurance authorization.  Dehydration with hyponatremia Secondary to poor oral intake at home, and HCTZ. Presented with serum sodium of 122. Post bolus IV fluids and brief maintenance IV fluids (stopped on hospital day 1), serum sodium has been stable in the low 130s.  Per spouse, oral intake has gradually improved.  Hesitant to add Remeron due to ongoing issues with AMS.  Stable.  Likely DC HCTZ at DC. Periodically follow BMP.  Ordered labs for 11/19.  See  Hypokalemia Hypomagnesemia Replaced.  Hypotension Not sure if all of this is due to volume depletion. Holding antihypertensives (PTA meds: Hydralazine  50 mg twice daily, HCTZ 25 mg daily, Bystolic  20 mg nightly, Benicar  20 mg twice daily) For the first 48 hours, had hypotension with SBP in  the 80s.  Midodrine had been added on admission.  However blood pressures normalized and even started getting in the hypertensive range, discontinued midodrine. Unclear if the prolonged hypotension on admission was due to residual effect of polypharmacy antihypertensives that she was on PTA.   ACTH stim test drawn 11/17 was an incomplete test.  Base cortisol <0.4, cortisol after 30 minutes 5 but no cortisol level drawn after 60 minutes.  Since 30 minutes cortisol level <18, suggestive of adrenal insufficiency.  However in the absence of ongoing hypotension or significant electrolyte abnormality, not sure if steroids need to be initiated.  Acute metabolic encephalopathy Multifactorial due to polypharmacy, dehydration and AKI.  As per discussion with spouse, history of significant alcohol use disorder in the recent past. CT head without acute findings.  MRI brain without acute stroke or acute findings. Delirium precautions.  Minimize opioids and sedatives.  On reduced dose Xanax  here-per patient report, has not really taken much of it over the last 2 weeks.  Discontinued Flexeril.  Monitor. Check thiamine-pending today, B12 levels/2288 (hold B12 supplements and may have to reduce dose at DC).,  Completed high-dose IV thiamine x 3 days, continue thiamine 100 mg daily by mouth. Given history of snoring, VBG without CO2 retention.  Ammonia level normal.  Has not gotten any opioids since a single dose on 11/13 night. UA with rare bacteria and significant pyuria but no UTI symptoms.  After detailed discussion with spouse, monitoring off of antibiotics. 11/18: Ongoing waxing and waning mental status changes.  However no agitation.  Consider outpatient neurocognitive consultation with Neurology.  Essential hypertension Controlled off of meds.  Continue to hold antihypertensives.  Alcohol use disorder Per spouse's report, patient used to drink 4 to 5 glasses of red wine daily prior to recent spine surgery but  since then has been drinking maybe half a glass of wine daily.  No alcohol withdrawal noted.  S/p L4-5 fusion 04/23/2024 Extensive CT T/L/C-spine as noted below, suspicious for nondisplaced fracture of left L4 transverse process. Neurosurgery to follow hospital course.  No recommendations except pursuing rehab.  Acute blood loss anemia Hemoglobin 13.7 on 10/27.  Hemoglobin stable in the 10 g range.  Hypothyroidism Synthroid .  TSH in June normal.   Vitamin B12 deficiency Continue supplementation, B12 levels as above.  Could consider reducing B12 supplements at DC.   Anxiety disorder/mood disorder As needed Xanax -has not received any here and was not getting much of it at home in the last 2 weeks.  Prior to that was taking more regularly.   Collagenous colitis No diarrhea Continue budesonide   13 x 6 mm ground glass nodule in the right lung apex Noted on CT C/A/P 11/13 Per recommendations, chest CT at 6 and 12 months to confirm persistence, if persistent then CT every 2 years until 5 years.  If growth or solid components develops, consider resection.  Outpatient follow-up.  Body mass index is 40.3 kg/m.  Class III obesity Complicates care.  Outpatient follow-up.   DVT prophylaxis: SCDs Start: 05/08/24 1236     Code Status: Full Code:  Family Communication: None at discharge. Disposition:  Medically optimized for DC to next level of care.  CIR has initiated insurance authorization.     Consultants:   Neurosurgery. Physiatry  Procedures:     Subjective:  Alert and oriented to self and partly to place.  Ongoing confusion.  No other complaints.  As per nursing, no acute issues.  Objective:   Vitals:   05/12/24 1900 05/12/24 1948 05/13/24 0437 05/13/24 0755  BP:  (!) 142/87 (!) 141/78 (!) 152/94  Pulse:  68 84 88  Resp:  16 16 16   Temp:  98.5 F (36.9 C) 98.3 F (36.8 C) 98.7 F (37.1 C)  TempSrc:   Oral Oral  SpO2:  100% 96% 94%  Weight: 90.5 kg     Height:         General exam: Middle-age female, moderately built and obese sitting up comfortably in chair.   Respiratory system: Clear to auscultation.  No increased work of breathing. Cardiovascular system: S1 & S2 heard, RRR. No JVD, murmurs, rubs, gallops or clicks. No pedal edema.  Off telemetry.  Stable. Gastrointestinal system: Abdomen is nondistended, soft and nontender. No organomegaly or masses felt. Normal bowel sounds heard. Central nervous system: Mentation as noted above. No focal neurological deficits. Extremities: Symmetric 5 x 5 power. Skin: Flushed bilateral cheeks and forehead.  Does not appear to be a rash.  Improved. Psychiatry: Judgement and insight impaired. Mood & affect pleasantly confused but not agitated.    Data Reviewed:   I have personally reviewed following labs and imaging studies   CBC: Recent Labs  Lab 05/08/24 0820 05/08/24 0827 05/09/24 0454 05/10/24 0311 05/11/24 0811  WBC 11.5*  --  6.7 6.2 6.0  NEUTROABS 9.1*  --   --   --   --   HGB 11.0*   < > 10.2* 10.7* 10.9*  HCT 32.3*   < > 29.4* 31.4* 31.4*  MCV 94.4  --  92.7 93.2 92.6  PLT 270  --  284 298 305   < > =  values in this interval not displayed.    Basic Metabolic Panel: Recent Labs  Lab 05/08/24 1246 05/08/24 1700 05/09/24 0454 05/09/24 1457 05/10/24 0311 05/11/24 0811  NA 129* 129* 131*  --  131* 133*  K 3.6 3.7 3.4*  --  3.8 3.9  CL 93* 94* 93*  --  95* 95*  CO2 25 23 25   --  24 28  GLUCOSE 117* 110* 104*  --  113* 116*  BUN 25* 21 14  --  12 11  CREATININE 0.95 1.03* 0.84  --  0.82 0.80  CALCIUM  9.2 9.2 9.4  --  9.6 9.6  MG  --   --   --  1.4* 2.5*  --     Liver Function Tests: Recent Labs  Lab 05/08/24 0820 05/09/24 0454 05/11/24 0811  AST 39 39 32  ALT 22 20 20   ALKPHOS 96 93 96  BILITOT 0.5 0.8 0.7  PROT 5.3* 5.1* 5.3*  ALBUMIN 2.5* 2.3* 2.4*    CBG: No results for input(s): GLUCAP in the last 168 hours.  Microbiology Studies:  No results found for this or  any previous visit (from the past 240 hours).  Radiology Studies:  No results found.    Scheduled Meds:    budesonide   9 mg Oral Daily   celecoxib   200 mg Oral Daily   cyanocobalamin  1,000 mcg Oral q AM   lactose free nutrition  237 mL Oral Daily   levothyroxine   100 mcg Oral Q0600   multivitamin with minerals  1 tablet Oral q AM   rosuvastatin   40 mg Oral Daily    Continuous Infusions:       LOS: 5 days     Trenda Mar, MD,  FACP, [facility], [facility], [facility]   [facility] [place]      To contact the attending provider between 7A-7P or the covering provider during after hours 7P-7A, please log into the web site [email or web address] and access using universal  password for that web site. If you do not have the password, please call the hospital operator.  05/13/2024, 1:49 PM

## 2024-05-13 NOTE — Progress Notes (Signed)
 Subjective: The patient is alert and pleasant.  She is awaiting rehab placement.  Objective: Vital signs in last 24 hours: Temp:  [98.1 F (36.7 C)-98.8 F (37.1 C)] 98.7 F (37.1 C) (11/18 0755) Pulse Rate:  [68-88] 88 (11/18 0755) Resp:  [16-19] 16 (11/18 0755) BP: (117-160)/(73-94) 152/94 (11/18 0755) SpO2:  [94 %-100 %] 94 % (11/18 0755) Weight:  [90.5 kg] 90.5 kg (11/17 1900) Estimated body mass index is 40.3 kg/m as calculated from the following:   Height as of this encounter: 4' 11 (1.499 m).   Weight as of this encounter: 90.5 kg.   Intake/Output from previous day: No intake/output data recorded. Intake/Output this shift: No intake/output data recorded.  Physical exam the patient is alert and pleasant.  Her strength is normal.  Lab Results: Recent Labs    05/11/24 0811  WBC 6.0  HGB 10.9*  HCT 31.4*  PLT 305   BMET Recent Labs    05/11/24 0811  NA 133*  K 3.9  CL 95*  CO2 28  GLUCOSE 116*  BUN 11  CREATININE 0.80  CALCIUM  9.6    Studies/Results: No results found.  Assessment/Plan: Status post lumbar fusion: We are awaiting rehab placement.  LOS: 5 days     Jenny Yates 05/13/2024, 7:58 AM     Patient ID: Jenny Yates, female   DOB: 1961/02/16, 63 y.o.   MRN: [id number]

## 2024-05-13 NOTE — Progress Notes (Signed)
   Inpatient Rehabilitation Admissions Coordinator   I Have begun Auth for CIR admit.  Heron Leavell, RN, MSN Rehab Admissions Coordinator [phone] 05/13/2024 11:56 AM

## 2024-05-13 NOTE — Progress Notes (Signed)
 Physical Therapy Treatment Patient Details Name: Jenny Yates MRN: [id number] DOB: 04/16/1961 Today's Date: 05/13/2024   History of Present Illness Jenny Yates is a 63 y.o. female who presented 05/08/24 after a fall out of bed, decreased intake and severe generalized weakness. Found to have left L4 transverse process fracture and hypotension/AKI/hyponatremia in setting of dehydration. PMH significant for 10/29 L4-5 PLIF, HTN, hypothyroidism    PT Comments  Pt with improved ambulation tolerance however continues to require frequent rest breaks and use of RW due to impaired balance and high fall risk. Pt continues to demo impaired cognition and expressive communication. Pt unable to complete a dual task, demo's significant delay in response time and sequencing both simple and multi-step tasks, and demo's impaired short term memory. Pt with significant decline in both function and cognition over the last few months. Prior to surgery pt was indep, participating in yoga 2x/week and exercising 2x/wk. Pt now requiring assist for all transfers, ADLs, and requires use of RW for safe ambulation. Pt to grealy benefit from intense rehab program > 3 hours a day to address both cognitive and functional deficits to achieve maximal functional recovery. Acute PT to cont to follow.   If plan is discharge home, recommend the following: Assistance with cooking/housework;Assist for transportation;Help with stairs or ramp for entrance;A lot of help with walking and/or transfers;A lot of help with bathing/dressing/bathroom;Direct supervision/assist for medications management;Direct supervision/assist for financial management;Supervision due to cognitive status   Can travel by private vehicle        Equipment Recommendations  Rolling walker (2 wheels);BSC/3in1;Wheelchair (measurements PT);Wheelchair cushion (measurements PT) (pediatric height DME (4'10))    Recommendations for Other Services Rehab consult      Precautions / Restrictions Precautions Precautions: Fall;Back Precaution Booklet Issued: No Recall of Precautions/Restrictions: Impaired Precaution/Restrictions Comments: pt able to recall 2/3 precautions with increased time but requires max verbal cues to adhere functionally Required Braces or Orthoses: Spinal Brace Spinal Brace: Lumbar corset;Applied in sitting position Restrictions Weight Bearing Restrictions Per Provider Order: No     Mobility  Bed Mobility               General bed mobility comments: OOB on arrival standing at sink with spouse    Transfers Overall transfer level: Needs assistance Equipment used: Rolling walker (2 wheels) Transfers: Sit to/from Stand Sit to Stand: Contact guard assist           General transfer comment: cues needed for hand placement, increased time    Ambulation/Gait Ambulation/Gait assistance: Min assist Gait Distance (Feet): 150 Feet Assistive device: Rolling walker (2 wheels) Gait Pattern/deviations: Decreased stride length, Step-to pattern, Decreased step length - right, Decreased step length - left, Decreased dorsiflexion - right, Decreased dorsiflexion - left, Shuffle, Wide base of support Gait velocity: reduced Gait velocity interpretation: <1.31 ft/sec, indicative of household ambulator   General Gait Details: Pt with a very wide BOS and small steps with poor dorsiflexion resulting in minimal foot clearance, worse with onset of fatigue. multiple standing rest breaks, max directional cues during turning as pt unable to sequence or complete directional task without max directional cues. Pt was able to read room numbers on the R hand side but had to stop in front of every room and could not complete a dual task   Stairs             Wheelchair Mobility     Tilt Bed    Modified Rankin (Stroke Patients Only)  Balance Overall balance assessment: Needs assistance Sitting-balance support: Feet supported,  No upper extremity supported Sitting balance-Leahy Scale: Fair     Standing balance support: Bilateral upper extremity supported, During functional activity, Reliant on assistive device for balance Standing balance-Leahy Scale: Poor Standing balance comment: reliant on RW, pt observed trying to comb hair without UE support, pt very unsteady and required minA to maintain balance, pt with noted tremors                            Communication Communication Communication: Impaired Factors Affecting Communication: Difficulty expressing self;Reduced clarity of speech (husband reports this has been progressive since surgery)  Cognition Arousal: Alert Behavior During Therapy: Flat affect   PT - Cognitive impairments: Awareness, Memory, Attention, Initiation, Sequencing, Problem solving, Safety/Judgement                       PT - Cognition Comments: pt with noted significat delay in response time, difficulty with sequencing multi-step commands ie. slide your L heel up your R shin, pt complete task using R heel and with significant increase in time. Pt with STM impairment as well Following commands: Impaired Following commands impaired: Follows multi-step commands inconsistently, Follows one step commands with increased time    Cueing Cueing Techniques: Verbal cues, Tactile cues  Exercises      General Comments General comments (skin integrity, edema, etc.): VSS on RA      Pertinent Vitals/Pain Pain Assessment Pain Assessment: Faces Faces Pain Scale: Hurts a little bit Pain Location: back and LEs Pain Descriptors / Indicators: Discomfort, Grimacing    Home Living                          Prior Function            PT Goals (current goals can now be found in the care plan section) Acute Rehab PT Goals Patient Stated Goal: to get better PT Goal Formulation: With patient/family Time For Goal Achievement: 05/23/24 Potential to Achieve Goals:  Good Progress towards PT goals: Progressing toward goals    Frequency    Min 3X/week      PT Plan      Co-evaluation              AM-PAC PT 6 Clicks Mobility   Outcome Measure  Help needed turning from your back to your side while in a flat bed without using bedrails?: A Little Help needed moving from lying on your back to sitting on the side of a flat bed without using bedrails?: A Little Help needed moving to and from a bed to a chair (including a wheelchair)?: A Little Help needed standing up from a chair using your arms (e.g., wheelchair or bedside chair)?: A Lot Help needed to walk in hospital room?: A Little   6 Click Score: 14    End of Session Equipment Utilized During Treatment: Gait belt;Back brace Activity Tolerance: Patient tolerated treatment well;Patient limited by fatigue Patient left: in chair;with call bell/phone within reach;with family/visitor present Nurse Communication: Mobility status PT Visit Diagnosis: Unsteadiness on feet (R26.81);Other abnormalities of gait and mobility (R26.89);Muscle weakness (generalized) (M62.81);Difficulty in walking, not elsewhere classified (R26.2);History of falling (Z91.81) Pain - part of body:  (back)     Time: 8960-8894 PT Time Calculation (min) (ACUTE ONLY): 26 min  Charges:    $Gait Training: 8-22 mins $Neuromuscular Re-education: 8-22  mins PT General Charges $$ ACUTE PT VISIT: 1 Visit                     Norene Ames, PT, DPT Acute Rehabilitation Services Secure chat preferred Office #: [phone]    Norene CHRISTELLA Ames 05/13/2024, 11:15 AM

## 2024-05-13 NOTE — TOC Initial Note (Signed)
 Transition of Care [facility]) - Initial/Assessment Note    Patient Details  Name: Jenny Yates MRN: [id number] Date of Birth: 03-Feb-1961  Transition of [facility]) CM/SW Contact:    Lauraine FORBES Saa, LCSWA Phone Number: 05/13/2024, 9:41 AM  Clinical Narrative:                  9:41 AM Per chart review, therapy recommended patient discharge to [facility]. [facility] has been following patient and to submit insurance authorization upon updated PT note. Patient resides at home with spouse. Patient has a PCP and insurance. Patient does not have prior SNF/HH/DME history. Patient's preferred pharmacy's are [facility] [address]. TOC will continue to follow.  Expected Discharge Plan: IP Rehab Facility Barriers to Discharge: Continued Medical Work up, English As A Second Language Teacher   Patient Goals and CMS Choice Patient states their goals for this hospitalization and ongoing recovery are:: CIR          Expected Discharge Plan and Services In-house Referral: Clinical Social Work Discharge Planning Services: CM Consult Post Acute Care Choice: IP Rehab Living arrangements for the past 2 months: Single Family Home                                      Prior Living Arrangements/Services Living arrangements for the past 2 months: Single Family Home Lives with:: Spouse Patient language and need for interpreter reviewed:: Yes        Need for Family Participation in Patient Care: No (Comment) Care giver support system in place?: Yes (comment)   Criminal Activity/Legal Involvement Pertinent to Current Situation/Hospitalization: No - Comment as needed  Activities of Daily Living   ADL Screening (condition at time of admission) Independently performs ADLs?: No Does the patient have a NEW difficulty with bathing/dressing/toileting/self-feeding that is expected to last >3 days?: Yes (Initiates electronic notice to provider for possible OT consult) Does  the patient have a NEW difficulty with getting in/out of bed, walking, or climbing stairs that is expected to last >3 days?: Yes (Initiates electronic notice to provider for possible PT consult) Does the patient have a NEW difficulty with communication that is expected to last >3 days?: No Is the patient deaf or have difficulty hearing?: No Does the patient have difficulty seeing, even when wearing glasses/contacts?: No Does the patient have difficulty concentrating, remembering, or making decisions?: No  Permission Sought/Granted Permission sought to share information with : Facility Medical Sales Representative, Family Supports Permission granted to share information with : No  Share Information with NAME: Vanecia Limpert  Permission granted to share info w AGENCY: [facility]  Permission granted to share info w Relationship: Spouse  Permission granted to share info w Contact Information: [phone]  Emotional Assessment       Orientation: : Oriented to  Time, Oriented to Situation, Oriented to Self, Oriented to Place Alcohol / Substance Use: Not Applicable Psych Involvement: No (comment)  Admission diagnosis:  Dehydration [E86.0] Hyponatremia [E87.1] AKI (acute kidney injury) [N17.9] Fall in home, initial encounter [W19.XXXA, Y92.009] Closed fracture of transverse process of lumbar vertebra, initial encounter (HCC) [S32.009A] History of lumbar fusion [Z98.1] Patient Active Problem List   Diagnosis Date Noted   AKI (acute kidney injury) 05/08/2024   Hyponatremia 05/08/2024   Spondylolisthesis of lumbar region 04/23/2024   Lumbar radiculopathy, acute 01/22/2024   Squamous cell carcinoma of skin 11/07/2023   Basal  cell carcinoma of skin 11/07/2023   Annual physical exam 05/23/2023   Herniated lumbar disc without myelopathy 05/23/2023   GAD (generalized anxiety disorder) 05/23/2023   Primary osteoarthritis involving multiple joints 05/23/2023   Hypothyroidism 05/23/2023   Collagenous  colitis 05/23/2023   BMI 40.0-44.9, adult (HCC) 05/23/2023   Morbid obesity (HCC) 05/23/2023   Hypercholesterolemia 11/06/2022   Essential hypertension 07/24/2022   Hyperlipidemia 07/24/2022   Chronic pain syndrome 07/24/2022   PCP:  Fleeta Valeria Mayo, MD Pharmacy:   [facility] [address], [place] - [address] [place] [address] Phone: [phone] Fax: [phone]  [facility] [address] [place] [address] Phone: [phone] Fax: [phone]     Social Drivers of Health (SDOH) Social History: SDOH Screenings   Food Insecurity: No Food Insecurity (05/08/2024)  Housing: Low Risk  (05/08/2024)  Transportation Needs: No Transportation Needs (05/08/2024)  Utilities: Not At Risk (05/08/2024)  Depression (PHQ2-9): Low Risk  (08/24/2023)  Tobacco Use: Medium Risk (05/09/2024)   SDOH Interventions:     Readmission Risk Interventions     No data to display

## 2024-05-13 NOTE — Progress Notes (Signed)
   Inpatient Rehabilitation Admissions Coordinator   I await updated PT assessment( last seen 11/14) to begin Auth with [employer] for possible Cir admit.  Heron Leavell, RN, MSN Rehab Admissions Coordinator [phone] 05/13/2024 8:22 AM

## 2024-05-14 ENCOUNTER — Inpatient Hospital Stay (HOSPITAL_COMMUNITY)
Admission: AD | Admit: 2024-05-14 | Discharge: 2024-05-21 | DRG: 559 | Disposition: A | Discharge status: Home / Self Care | Source: Intra-hospital | Attending: Physical Medicine & Rehabilitation | Admitting: Physical Medicine & Rehabilitation

## 2024-05-14 ENCOUNTER — Other Ambulatory Visit: Payer: Self-pay

## 2024-05-14 ENCOUNTER — Encounter (HOSPITAL_COMMUNITY): Payer: Self-pay | Admitting: Physical Medicine & Rehabilitation

## 2024-05-14 ENCOUNTER — Inpatient Hospital Stay (HOSPITAL_COMMUNITY)

## 2024-05-14 DIAGNOSIS — I959 Hypotension, unspecified: Secondary | ICD-10-CM | POA: Diagnosis present

## 2024-05-14 DIAGNOSIS — B961 Klebsiella pneumoniae [K. pneumoniae] as the cause of diseases classified elsewhere: Secondary | ICD-10-CM | POA: Diagnosis not present

## 2024-05-14 DIAGNOSIS — Z885 Allergy status to narcotic agent status: Secondary | ICD-10-CM | POA: Diagnosis not present

## 2024-05-14 DIAGNOSIS — Z981 Arthrodesis status: Secondary | ICD-10-CM

## 2024-05-14 DIAGNOSIS — W19XXXD Unspecified fall, subsequent encounter: Secondary | ICD-10-CM | POA: Diagnosis present

## 2024-05-14 DIAGNOSIS — E785 Hyperlipidemia, unspecified: Secondary | ICD-10-CM | POA: Diagnosis not present

## 2024-05-14 DIAGNOSIS — F32A Depression, unspecified: Secondary | ICD-10-CM | POA: Diagnosis not present

## 2024-05-14 DIAGNOSIS — E871 Hypo-osmolality and hyponatremia: Secondary | ICD-10-CM | POA: Diagnosis not present

## 2024-05-14 DIAGNOSIS — Z881 Allergy status to other antibiotic agents status: Secondary | ICD-10-CM

## 2024-05-14 DIAGNOSIS — D72829 Elevated white blood cell count, unspecified: Secondary | ICD-10-CM

## 2024-05-14 DIAGNOSIS — Z6841 Body Mass Index (BMI) 40.0 and over, adult: Secondary | ICD-10-CM | POA: Diagnosis not present

## 2024-05-14 DIAGNOSIS — K5900 Constipation, unspecified: Secondary | ICD-10-CM | POA: Diagnosis present

## 2024-05-14 DIAGNOSIS — F410 Panic disorder [episodic paroxysmal anxiety] without agoraphobia: Secondary | ICD-10-CM | POA: Diagnosis present

## 2024-05-14 DIAGNOSIS — S32009A Unspecified fracture of unspecified lumbar vertebra, initial encounter for closed fracture: Principal | ICD-10-CM | POA: Diagnosis present

## 2024-05-14 DIAGNOSIS — Z87891 Personal history of nicotine dependence: Secondary | ICD-10-CM | POA: Diagnosis not present

## 2024-05-14 DIAGNOSIS — G9341 Metabolic encephalopathy: Secondary | ICD-10-CM | POA: Diagnosis present

## 2024-05-14 DIAGNOSIS — E039 Hypothyroidism, unspecified: Secondary | ICD-10-CM | POA: Diagnosis present

## 2024-05-14 DIAGNOSIS — Z79899 Other long term (current) drug therapy: Secondary | ICD-10-CM | POA: Diagnosis not present

## 2024-05-14 DIAGNOSIS — E538 Deficiency of other specified B group vitamins: Secondary | ICD-10-CM | POA: Diagnosis present

## 2024-05-14 DIAGNOSIS — E860 Dehydration: Secondary | ICD-10-CM | POA: Diagnosis present

## 2024-05-14 DIAGNOSIS — Z91041 Radiographic dye allergy status: Secondary | ICD-10-CM

## 2024-05-14 DIAGNOSIS — I10 Essential (primary) hypertension: Secondary | ICD-10-CM | POA: Diagnosis present

## 2024-05-14 DIAGNOSIS — E66813 Obesity, class 3: Secondary | ICD-10-CM | POA: Diagnosis not present

## 2024-05-14 DIAGNOSIS — G9340 Encephalopathy, unspecified: Secondary | ICD-10-CM

## 2024-05-14 DIAGNOSIS — Z888 Allergy status to other drugs, medicaments and biological substances status: Secondary | ICD-10-CM

## 2024-05-14 DIAGNOSIS — Z791 Long term (current) use of non-steroidal anti-inflammatories (NSAID): Secondary | ICD-10-CM

## 2024-05-14 DIAGNOSIS — N390 Urinary tract infection, site not specified: Secondary | ICD-10-CM | POA: Diagnosis not present

## 2024-05-14 DIAGNOSIS — N3000 Acute cystitis without hematuria: Secondary | ICD-10-CM | POA: Diagnosis not present

## 2024-05-14 DIAGNOSIS — R0989 Other specified symptoms and signs involving the circulatory and respiratory systems: Secondary | ICD-10-CM | POA: Diagnosis not present

## 2024-05-14 DIAGNOSIS — K5901 Slow transit constipation: Secondary | ICD-10-CM | POA: Diagnosis not present

## 2024-05-14 DIAGNOSIS — S32009D Unspecified fracture of unspecified lumbar vertebra, subsequent encounter for fracture with routine healing: Secondary | ICD-10-CM | POA: Diagnosis not present

## 2024-05-14 DIAGNOSIS — Z4789 Encounter for other orthopedic aftercare: Secondary | ICD-10-CM | POA: Diagnosis not present

## 2024-05-14 DIAGNOSIS — K52831 Collagenous colitis: Secondary | ICD-10-CM | POA: Diagnosis present

## 2024-05-14 DIAGNOSIS — N179 Acute kidney failure, unspecified: Secondary | ICD-10-CM | POA: Diagnosis not present

## 2024-05-14 DIAGNOSIS — M5416 Radiculopathy, lumbar region: Secondary | ICD-10-CM | POA: Diagnosis not present

## 2024-05-14 DIAGNOSIS — Z79890 Hormone replacement therapy (postmenopausal): Secondary | ICD-10-CM | POA: Diagnosis not present

## 2024-05-14 DIAGNOSIS — M48061 Spinal stenosis, lumbar region without neurogenic claudication: Secondary | ICD-10-CM | POA: Diagnosis not present

## 2024-05-14 DIAGNOSIS — M5116 Intervertebral disc disorders with radiculopathy, lumbar region: Secondary | ICD-10-CM | POA: Diagnosis present

## 2024-05-14 LAB — CBC
HCT: 35.3 % — ABNORMAL LOW (ref 36.0–46.0)
Hemoglobin: 11.8 g/dL — ABNORMAL LOW (ref 12.0–15.0)
MCH: 32 pg (ref 26.0–34.0)
MCHC: 33.4 g/dL (ref 30.0–36.0)
MCV: 95.7 fL (ref 80.0–100.0)
Platelets: 271 K/uL (ref 150–400)
RBC: 3.69 MIL/uL — ABNORMAL LOW (ref 3.87–5.11)
RDW: 13 % (ref 11.5–15.5)
WBC: 21.1 K/uL — ABNORMAL HIGH (ref 4.0–10.5)
nRBC: 0 % (ref 0.0–0.2)

## 2024-05-14 LAB — BASIC METABOLIC PANEL WITH GFR
Anion gap: 12 (ref 5–15)
BUN: 15 mg/dL (ref 8–23)
CO2: 27 mmol/L (ref 22–32)
Calcium: 9.3 mg/dL (ref 8.9–10.3)
Chloride: 98 mmol/L (ref 98–111)
Creatinine, Ser: 1.12 mg/dL — ABNORMAL HIGH (ref 0.44–1.00)
GFR, Estimated: 55 mL/min — ABNORMAL LOW (ref >60)
Glucose, Bld: 122 mg/dL — ABNORMAL HIGH (ref 70–99)
Potassium: 4.1 mmol/L (ref 3.5–5.1)
Sodium: 137 mmol/L (ref 135–145)

## 2024-05-14 MED ORDER — BOOST PO LIQD
237.0000 mL | Freq: Every day | ORAL | Status: DC
  Administered 2024-05-15 – 2024-05-21 (×7): 237 mL via ORAL
  Filled 2024-05-14 (×7): qty 237

## 2024-05-14 MED ORDER — ALPRAZOLAM 0.25 MG PO TABS: 0.2500 mg | ORAL_TABLET | Freq: Every evening | ORAL | Status: DC | PRN

## 2024-05-14 MED ORDER — VITAMIN B-1 100 MG PO TABS: 100.0000 mg | ORAL_TABLET | Freq: Every day | ORAL | Status: DC

## 2024-05-14 MED ORDER — ACETAMINOPHEN 650 MG RE SUPP: 650.0000 mg | Freq: Four times a day (QID) | RECTAL | Status: DC | PRN

## 2024-05-14 MED ORDER — CELECOXIB 100 MG PO CAPS
200.0000 mg | ORAL_CAPSULE | Freq: Every day | ORAL | Status: DC
  Administered 2024-05-15 – 2024-05-21 (×7): 200 mg via ORAL
  Filled 2024-05-14 (×7): qty 2

## 2024-05-14 MED ORDER — OXYCODONE HCL 5 MG PO TABS: 5.0000 mg | ORAL_TABLET | Freq: Three times a day (TID) | ORAL | Status: DC | PRN

## 2024-05-14 MED ORDER — ORAL CARE MOUTH RINSE: 15.0000 mL | OROMUCOSAL | Status: DC | PRN

## 2024-05-14 MED ORDER — SODIUM CHLORIDE 0.9 % IV SOLN: INTRAVENOUS | Status: DC

## 2024-05-14 MED ORDER — ACETAMINOPHEN 325 MG PO TABS: 650.0000 mg | ORAL_TABLET | Freq: Four times a day (QID) | ORAL | Status: DC | PRN

## 2024-05-14 MED ORDER — ADULT MULTIVITAMIN W/MINERALS CH
1.0000 | ORAL_TABLET | Freq: Every morning | ORAL | Status: DC
  Administered 2024-05-15 – 2024-05-21 (×7): 1 via ORAL
  Filled 2024-05-14 (×7): qty 1

## 2024-05-14 MED ORDER — THIAMINE MONONITRATE 100 MG PO TABS
100.0000 mg | ORAL_TABLET | Freq: Every day | ORAL | Status: DC
  Administered 2024-05-15 – 2024-05-21 (×7): 100 mg via ORAL
  Filled 2024-05-14 (×7): qty 1

## 2024-05-14 MED ORDER — ROSUVASTATIN CALCIUM 20 MG PO TABS
40.0000 mg | ORAL_TABLET | Freq: Every day | ORAL | Status: DC
  Administered 2024-05-15 – 2024-05-21 (×7): 40 mg via ORAL
  Filled 2024-05-14 (×7): qty 2

## 2024-05-14 MED ORDER — ALBUTEROL SULFATE (2.5 MG/3ML) 0.083% IN NEBU: 2.5000 mg | INHALATION_SOLUTION | RESPIRATORY_TRACT | Status: DC | PRN

## 2024-05-14 MED ORDER — BISACODYL 10 MG RE SUPP: 10.0000 mg | Freq: Every day | RECTAL | Status: DC | PRN

## 2024-05-14 MED ORDER — FLUTICASONE PROPIONATE 50 MCG/ACT NA SUSP: 1.0000 | Freq: Every day | NASAL | Status: DC | PRN

## 2024-05-14 MED ORDER — POLYETHYLENE GLYCOL 3350 17 G PO PACK: 17.0000 g | PACK | Freq: Every day | ORAL | Status: DC | PRN

## 2024-05-14 MED ORDER — LEVOTHYROXINE SODIUM 100 MCG PO TABS
100.0000 ug | ORAL_TABLET | Freq: Every day | ORAL | Status: DC
  Administered 2024-05-15 – 2024-05-21 (×7): 100 ug via ORAL
  Filled 2024-05-14 (×7): qty 1

## 2024-05-14 MED ORDER — ACETAMINOPHEN 325 MG PO TABS
650.0000 mg | ORAL_TABLET | Freq: Four times a day (QID) | ORAL | Status: DC | PRN
  Administered 2024-05-15 – 2024-05-21 (×10): 650 mg via ORAL
  Filled 2024-05-14 (×9): qty 2

## 2024-05-14 MED ORDER — BUDESONIDE 3 MG PO CPEP
9.0000 mg | ORAL_CAPSULE | Freq: Every day | ORAL | Status: DC
  Administered 2024-05-15 – 2024-05-16 (×2): 9 mg via ORAL
  Filled 2024-05-14 (×2): qty 3

## 2024-05-14 NOTE — Plan of Care (Signed)
  Problem: Education: Goal: Knowledge of General Education information will improve Description: Including pain rating scale, medication(s)/side effects and non-pharmacologic comfort measures 05/14/2024 0620 by Gilberto Tinnie KIDD, RN Outcome: Progressing 05/14/2024 0619 by Gilberto Tinnie KIDD, RN Outcome: Progressing   Problem: Health Behavior/Discharge Planning: Goal: Ability to manage health-related needs will improve 05/14/2024 0620 by Gilberto Tinnie KIDD, RN Outcome: Progressing 05/14/2024 0619 by Gilberto Tinnie KIDD, RN Outcome: Progressing   Problem: Clinical Measurements: Goal: Ability to maintain clinical measurements within normal limits will improve 05/14/2024 0620 by Gilberto Tinnie KIDD, RN Outcome: Progressing 05/14/2024 0619 by Gilberto Tinnie KIDD, RN Outcome: Progressing Goal: Will remain free from infection 05/14/2024 0620 by Gilberto Tinnie KIDD, RN Outcome: Progressing 05/14/2024 0619 by Gilberto Tinnie KIDD, RN Outcome: Progressing Goal: Diagnostic test results will improve 05/14/2024 0620 by Gilberto Tinnie KIDD, RN Outcome: Progressing 05/14/2024 0619 by Gilberto Tinnie KIDD, RN Outcome: Progressing Goal: Respiratory complications will improve 05/14/2024 0620 by Gilberto Tinnie KIDD, RN Outcome: Progressing 05/14/2024 0619 by Gilberto Tinnie KIDD, RN Outcome: Progressing Goal: Cardiovascular complication will be avoided 05/14/2024 9379 by Gilberto Tinnie KIDD, RN Outcome: Progressing 05/14/2024 0619 by Gilberto Tinnie KIDD, RN Outcome: Progressing   Problem: Nutrition: Goal: Adequate nutrition will be maintained 05/14/2024 0620 by Gilberto Tinnie KIDD, RN Outcome: Progressing 05/14/2024 0619 by Gilberto Tinnie KIDD, RN Outcome: Progressing   Problem: Coping: Goal: Level of anxiety will decrease 05/14/2024 0620 by Gilberto Tinnie KIDD, RN Outcome: Progressing 05/14/2024 0619 by Gilberto Tinnie KIDD, RN Outcome: Progressing   Problem: Elimination: Goal: Will not experience complications  related to bowel motility 05/14/2024 0620 by Gilberto Tinnie KIDD, RN Outcome: Progressing 05/14/2024 0619 by Gilberto Tinnie KIDD, RN Outcome: Progressing Goal: Will not experience complications related to urinary retention 05/14/2024 0620 by Gilberto Tinnie KIDD, RN Outcome: Progressing 05/14/2024 0619 by Gilberto Tinnie KIDD, RN Outcome: Progressing   Problem: Safety: Goal: Ability to remain free from injury will improve 05/14/2024 0620 by Gilberto Tinnie KIDD, RN Outcome: Progressing 05/14/2024 0619 by Gilberto Tinnie KIDD, RN Outcome: Progressing   Problem: Skin Integrity: Goal: Risk for impaired skin integrity will decrease 05/14/2024 0620 by Gilberto Tinnie KIDD, RN Outcome: Progressing 05/14/2024 0619 by Gilberto Tinnie KIDD, RN Outcome: Progressing

## 2024-05-14 NOTE — Progress Notes (Signed)
 Jenny Arthea DASEN, MD  Physician Physical Medicine and Rehabilitation   Consult Note    Signed   Date of Service: 05/12/2024 11:34 AM  Related encounter: ED to Hosp-Admission (Current) from 05/08/2024 in [facility] GENERAL MED/SURG UNIT   Signed     Expand All Collapse All  Show:Clear all [x] Written[x] Templated[] Copied  Added by: [x] Jenny Arthea DASEN, MD  [] Hover for details          Physical Medicine and Rehabilitation Consult Reason for Consult: Impaired functional mobility Referring Physician: Judeth     HPI: Jenny Yates is a 63 y.o. female with a history of hypertension and chronic low back pain status post L4-L5 fusion on 04/23/2024 who presented on 05/08/2024 after a fall at home with confusion.  She was found to be dehydrated with acute kidney injury, hyponatremia.  She was given IV fluids with recovery.  Electrolytes were replaced.  Encephalopathy was felt to be multifactorial.  Imaging of the spine revealed a possible L4 transverse process fracture.  Conservative management was recommended by neurosurgery.  Patient was evaluated by therapies on Friday and was mod assist for sit to stand transfers and min assist 8 feet using a rolling walker.  She was up with Occupational Therapy this morning and was min to mod assist for basic ADLs.  Patient lives at home with her husband in a two-level house with 2 steps to enter.  She can stay on the first floor if needed.  Patient was walking with a rolling walker after initial back surgery, husband was assisting with transfers and mobility as well as ADLs during that period.     Home: Home Living Family/patient expects to be discharged to:: Private residence Living Arrangements: Spouse/significant other Available Help at Discharge: Family, Available 24 hours/day Type of Home: House Home Access: Stairs to enter [employer] of Steps: 2 (2 + a large threshold step) Entrance Stairs-Rails: Right, Left, Can  reach both Home Layout: Two level, Able to live on main level with bedroom/bathroom Alternate Level Stairs-Number of Steps: flight Bathroom Shower/Tub: Engineer, Manufacturing Systems: Standard Bathroom Accessibility: Yes Home Equipment: Information systems manager, Grab bars - tub/shower, Medical Laboratory Scientific Officer - single point Additional Comments: spouse retired Insurance Underwriter  Lives With: Spouse  Functional History: Prior Function Prior Level of Function : Independent/Modified Independent Mobility Comments: youth RW for all mobility since back surgery. pts husband assists with all transfers and mobility. Pt tolerates ~85ft. 4x/day per husband ADLs Comments: pt's husband assists with all aspects of ADLs, sponge bathing only since surgery. [name] husband also reports impaired cognition since surgery Functional Status:  Mobility: Bed Mobility General bed mobility comments: OOB on arrival Transfers Overall transfer level: Needs assistance Equipment used: Rolling walker (2 wheels) Transfers: Sit to/from Stand Sit to Stand: Contact guard assist General transfer comment: cues needed for hand placement Ambulation/Gait Ambulation/Gait assistance: Min assist Gait Distance (Feet): 8 Feet Assistive device: Rolling walker (2 wheels) Gait Pattern/deviations: Decreased stride length, Step-to pattern, Decreased step length - right, Decreased step length - left, Decreased dorsiflexion - right, Decreased dorsiflexion - left, Shuffle, Wide base of support General Gait Details: Pt with a very wide BOS and small steps with poor dorsiflexion, resulting in her sliding her bil feet to advance feet. Verbal and tactile cues needed to shift weight and coordinate steps and RW. MinA for balance. Pt fatigued quickly Gait velocity: reduced Gait velocity interpretation: <1.31 ft/sec, indicative of household ambulator   ADL: ADL Overall ADL's : Needs assistance/impaired Eating/Feeding: Minimal  assistance, Sitting Grooming: Minimal assistance,  Sitting Upper Body Bathing: Minimal assistance, Sitting Lower Body Bathing: Moderate assistance, Sitting/lateral leans, Sit to/from stand Lower Body Bathing Details (indicate cue type and reason): assist for peri area and feet Upper Body Dressing : Set up, Sitting Lower Body Dressing: Total assistance Lower Body Dressing Details (indicate cue type and reason): total A for socks Toilet Transfer: Contact guard assist, Ambulation Toilet Transfer Details (indicate cue type and reason): functional ambulation WFL with RW, cues for hand placement Toileting- Clothing Manipulation and Hygiene: Total assistance, Sit to/from stand Functional mobility during ADLs: Contact guard assist, Rolling walker (2 wheels) General ADL Comments: continues to needs assist for LB tasks and cognition. Pt took a full shower with assist, she did dequence through most ADLs WFL but needed cues for safety   Cognition: Cognition Orientation Level: Oriented X4, Other (comment) (intermittent confusion) Cognition Arousal: Alert Behavior During Therapy: WFL for tasks assessed/performed     Review of Systems  Constitutional:  Positive for malaise/fatigue. Negative for fever.  HENT: Negative.    Eyes: Negative.   Respiratory: Negative.    Cardiovascular: Negative.   Gastrointestinal: Negative.   Genitourinary: Negative.   Musculoskeletal:  Positive for back pain, falls and myalgias.  Skin: Negative.   Neurological:  Positive for weakness.  Psychiatric/Behavioral:  Positive for memory loss.        Past Medical History:  Diagnosis Date   Anemia     Anxiety     Collagenous colitis     Depression     Hyperlipidemia     Hypertension     Hypothyroidism     Lumbar herniated disc     Panic disorder               Past Surgical History:  Procedure Laterality Date   BACK SURGERY        2007   HERNIA REPAIR       TUBAL LIGATION            History reviewed. No pertinent family history.     Social History:   reports that she has quit smoking. Her smoking use included cigarettes. She has never used smokeless tobacco. She reports that she does not currently use alcohol. She reports that she does not use drugs. Allergies:  Allergies       Allergies  Allergen Reactions   Codeine Other (See Comments)      Syncope knocks me to the floor   Epinephrine  Other (See Comments)      Oral seizure-like activity   Azithromycin Rash   Iodine Swelling and Rash            Medications Prior to Admission  Medication Sig Dispense Refill   acetaminophen  (TYLENOL ) 500 MG tablet Take 500 mg by mouth in the morning, at noon, in the evening, and at bedtime. 9am, 3pm, 9pm, 3am       ALPRAZolam  (XANAX ) 0.5 MG tablet Take 1 tablet (0.5 mg total) by mouth 3 (three) times daily as needed. (Patient taking differently: Take 0.5 mg by mouth 3 (three) times daily.) 90 tablet 2   budesonide  (ENTOCORT EC ) 3 MG 24 hr capsule TAKE 3 CAPSULES BY MOUTH EVERY DAY 270 capsule 1   celecoxib  (CELEBREX ) 200 MG capsule Take 200 mg by mouth daily.       Cholecalciferol (VITAMIN D3 PO) Take 1,000 Units by mouth in the morning and at bedtime.       cyanocobalamin  (VITAMIN B12) 1000 MCG tablet  Take 1,000 mcg by mouth in the morning.       cyclobenzaprine (FLEXERIL) 5 MG tablet Take 1 tablet (5 mg total) by mouth 3 (three) times daily as needed for muscle spasms. (Patient taking differently: Take 5 mg by mouth 3 (three) times daily. 6am, 3pm, midnight) 30 tablet 0   diphenhydrAMINE HCl (BENADRYL PO) Take 1 tablet by mouth as needed (sleep).       hydrALAZINE  (APRESOLINE ) 50 MG tablet Take 1 tablet (50 mg total) by mouth in the morning and at bedtime. TAKE 1 TABLET 2 TIMES DAILYWITH FOOD 180 tablet 1   hydrochlorothiazide  (HYDRODIURIL ) 25 MG tablet TAKE 1 TABLET DAILY 90 tablet 1   lactose free nutrition (BOOST) LIQD Take 237 mLs by mouth daily.       levothyroxine  (SYNTHROID ) 100 MCG tablet Take 1 tablet by mouth once daily 30 tablet 0    Multiple Vitamin (MULTIVITAMIN WITH MINERALS) TABS tablet Take 1 tablet by mouth in the morning.       nebivolol  (BYSTOLIC ) 10 MG tablet Take 2 tablets (20 mg total) by mouth daily. (Patient taking differently: Take 20 mg by mouth at bedtime.) 180 tablet 1   olmesartan  (BENICAR ) 20 MG tablet Take 1 tablet (20 mg total) by mouth in the morning and at bedtime. 180 tablet 3   oxyCODONE-acetaminophen  (PERCOCET/ROXICET) 5-325 MG tablet Take 1-2 tablets by mouth every 6 (six) hours as needed for pain (Patient taking differently: Take 1 tablet by mouth every 6 (six) hours as needed for moderate pain (pain score 4-6) or severe pain (pain score 7-10). 6am, noon, 6pm, midnight) 40 tablet 0   rosuvastatin  (CRESTOR ) 40 MG tablet Take 1 tablet (40 mg total) by mouth daily. (Patient taking differently: Take 40 mg by mouth at bedtime.) 30 tablet 3            Blood pressure (!) 151/78, pulse 76, temperature 97.7 F (36.5 C), temperature source Oral, resp. rate 14, height 4' 10 (1.473 m), weight 90 kg, SpO2 100%. Physical Exam Constitutional:      General: She is not in acute distress.    Appearance: She is obese.  HENT:     Head: Normocephalic and atraumatic.     Nose: Nose normal.     Mouth/Throat:     Mouth: Mucous membranes are moist.  Eyes:     Extraocular Movements: Extraocular movements intact.     Pupils: Pupils are equal, round, and reactive to light.  Cardiovascular:     Rate and Rhythm: Normal rate.  Pulmonary:     Effort: Pulmonary effort is normal.  Abdominal:     Palpations: Abdomen is soft.  Musculoskeletal:        General: Tenderness (Low back) present. Normal range of motion.     Cervical back: Normal range of motion.     Right lower leg: Edema present.     Left lower leg: Edema present.  Skin:    Comments: Back incision covered by LSO. Chronic stasis changes in both LE's.  Neurological:     Comments: Pt alert. Oriented to person, place, reason she's here, month/year. Delays  when giving her address, difficulties with processing thoughts and following conversation. CN exam is non-focal. MMT: 4/5 prox BUE to 5/5 bilateral HI. BLE: 3-/5 HF, 3/5 KE and 4/5 ADF/PF. Sensory exam normal for light touch and pain in all 4 limbs. No limb ataxia or cerebellar signs. No abnormal tone appreciated.    Psychiatric:     Comments: A  little flat, tangential at times. cooperative       Lab Results Last 24 Hours       Results for orders placed or performed during the hospital encounter of 05/08/24 (from the past 24 hours)  ACTH stimulation, 3 time points (baseline, 30 min, 60 min)     Status: None (Preliminary result)    Collection Time: 05/12/24  6:10 AM  Result Value Ref Range    Cortisol, Base <0.4 ug/dL    Cortisol, 30 Min 5.0 ug/dL    Cortisol, 60 Min PENDING ug/dL       Imaging Results (Last 48 hours)  MR BRAIN WO CONTRAST Result Date: 05/10/2024 EXAM: MRI BRAIN WITHOUT CONTRAST 05/10/2024 01:34:00 PM TECHNIQUE: Multiplanar multisequence MRI of the head/brain was performed without the administration of intravenous contrast. COMPARISON: None available. CLINICAL HISTORY: Mental status change, unknown cause. FINDINGS: BRAIN AND VENTRICLES: Moderate generalized atrophy and white matter changes are advanced for age. White matter changes extend into the brainstem. No acute infarct. No intracranial hemorrhage. No mass. No midline shift. No hydrocephalus. The sella is unremarkable. Normal flow voids. ORBITS: No acute abnormality. SINUSES AND MASTOIDS: No acute abnormality. BONES AND SOFT TISSUES: Normal marrow signal. No acute soft tissue abnormality. IMPRESSION: 1. No acute findings. 2. Moderate generalized atrophy and advanced-for-age white matter changes extending into the brainstem. Electronically signed by: Lonni Necessary MD 05/10/2024 02:33 PM EST RP Workstation: [id number]       Assessment/Plan: Diagnosis: 63 year old female with history of L4-L5 PLIF on 04/23/2024 by Dr.  Mavis.  She suffered a fall at home postoperatively likely due to dehydration and suffered an L4 transverse process fracture. Does the need for close, 24 hr/day medical supervision in concert with the patient's rehab needs make it unreasonable for this patient to be served in a less intensive setting? Yes Co-Morbidities requiring supervision/potential complications:  -Metabolic encephalopathy Prerenal azotemia/AKI -Hypertension- -postoperative pain -Acute blood loss anemia   Due to bladder management, bowel management, safety, skin/wound care, disease management, medication administration, pain management, and patient education, does the patient require 24 hr/day rehab nursing? Yes Does the patient require coordinated care of a physician, rehab nurse, therapy disciplines of PT, OT, and potentiall SLP to address physical and functional deficits in the context of the above medical diagnosis(es)? Yes Addressing deficits in the following areas: balance, endurance, locomotion, strength, transferring, bowel/bladder control, bathing, dressing, feeding, grooming, toileting, cognition, and psychosocial support Can the patient actively participate in an intensive therapy program of at least 3 hrs of therapy per day at least 5 days per week? Yes The potential for patient to make measurable gains while on inpatient rehab is excellent Anticipated functional outcomes upon discharge from inpatient rehab are supervision  with PT, supervision and min assist with OT, modified independent with SLP. Estimated rehab length of stay to reach the above functional goals is: 9-12 days Anticipated discharge destination: Home Overall Rehab/Functional Prognosis: excellent   POST ACUTE RECOMMENDATIONS: This patient's condition is appropriate for continued rehabilitative care in the following setting: CIR Patient has agreed to participate in recommended program. Yes Note that insurance prior authorization may be required  for reimbursement for recommended care.   Comment: Pt still with cognitive deficits in the areas of problem solving, memory, and attention. Husband can provide assistance at home. Rehab Admissions Coordinator to follow up.            I have personally performed a face to face diagnostic evaluation of this patient. Additionally, I have examined the  patient's medical record including any pertinent labs and radiographic images.     Thanks,   Arthea ONEIDA Gunther, MD 05/12/2024          Routing History

## 2024-05-14 NOTE — Progress Notes (Signed)
 Occupational Therapy Treatment Patient Details Name: KIIRA BRACH MRN: [id number] DOB: 1961/05/14 Today's Date: 05/14/2024   History of present illness ELLE VEZINA is a 63 y.o. female who presented 05/08/24 after a fall out of bed, decreased intake and severe generalized weakness. Found to have left L4 transverse process fracture and hypotension/AKI/hyponatremia in setting of dehydration. PMH significant for 10/29 L4-5 PLIF, HTN, hypothyroidism   OT comments  Pt progressing towards OT goals this session. Pt was walking in hallway with husband and RN, upon entering the room PT was CGA for transfer to recliner with vc for sequencing and positioning in large chair. Then session focused on AE for LB ADL including sock aide, long handle sponge, toilet aide. Pt and husband present and participatory. Pt initially able to recall 2/3 back precautions and 3/3 by the end. She continues to demonstrate deficits in activity tolerance, ROM, cognition, safety, and will benefit from rehab of >3 hours daily to maximize safety and independence in ADL and functional transfers.       If plan is discharge home, recommend the following:  A lot of help with bathing/dressing/bathroom;Assistance with cooking/housework;Assist for transportation;Help with stairs or ramp for entrance;A little help with walking and/or transfers;Supervision due to cognitive status   Equipment Recommendations  Other (comment) (defer to next venue - adaptive equipment)    Recommendations for Other Services      Precautions / Restrictions Precautions Precautions: Fall;Back Precaution Booklet Issued: No Recall of Precautions/Restrictions: Impaired Precaution/Restrictions Comments: pt able to recall 2/3 precautions with increased time but requires max verbal cues to adhere functionally Required Braces or Orthoses: Spinal Brace Spinal Brace: Lumbar corset;Applied in sitting position Restrictions Weight Bearing Restrictions Per Provider  Order: No       Mobility Bed Mobility               General bed mobility comments: walking in hallway with RN upon arrival, in recliner at the end    Transfers Overall transfer level: Needs assistance Equipment used: Rolling walker (2 wheels) Transfers: Sit to/from Stand Sit to Stand: Contact guard assist           General transfer comment: cues needed for hand placement, increased time - from elevated surface due to height of Pt     Balance Overall balance assessment: Needs assistance Sitting-balance support: Feet supported, No upper extremity supported Sitting balance-Leahy Scale: Fair     Standing balance support: Bilateral upper extremity supported, During functional activity, Reliant on assistive device for balance Standing balance-Leahy Scale: Poor Standing balance comment: reliant on RW or external support in static or dynamic standing balance                           ADL either performed or assessed with clinical judgement   ADL Overall ADL's : Needs assistance/impaired         Upper Body Bathing: Minimal assistance;With adaptive equipment;Cueing for sequencing;Sitting Upper Body Bathing Details (indicate cue type and reason): practiced with long handle sponge Lower Body Bathing: Moderate assistance;Cueing for sequencing;Cueing for back precautions;With adaptive equipment;Sit to/from stand Lower Body Bathing Details (indicate cue type and reason): demonstrated with long handle sponge, cues for back precautions Upper Body Dressing : Moderate assistance Upper Body Dressing Details (indicate cue type and reason): donning brace, husband assisted Lower Body Dressing: Maximal assistance;Sit to/from stand;With adaptive equipment Lower Body Dressing Details (indicate cue type and reason): husband can and does perform but practiced with sock  donner this session as well as verbal education for Biochemist, Clinical: Contact guard  assist;Ambulation;Rolling walker (2 wheels) Toilet Transfer Details (indicate cue type and reason): functional ambulation WFL with RW, cues for hand placement Toileting- Clothing Manipulation and Hygiene: Maximal assistance;Sit to/from stand Toileting - Clothing Manipulation Details (indicate cue type and reason): educated in toilet aide     Functional mobility during ADLs: Contact guard assist;Rolling walker (2 wheels)      Extremity/Trunk Assessment Upper Extremity Assessment Upper Extremity Assessment: Generalized weakness            Vision       Perception     Praxis     Communication Communication Communication: Impaired Factors Affecting Communication: Difficulty expressing self;Reduced clarity of speech (husband reports this has been progressive since surgery)   Cognition Arousal: Alert Behavior During Therapy: Flat affect Cognition: Cognition impaired     Awareness: Online awareness impaired Memory impairment (select all impairments): Short-term memory, Working civil service fast streamer, Conservation officer, historic buildings Attention impairment (select first level of impairment): Selective attention Executive functioning impairment (select all impairments): Organization, Sequencing, Reasoning, Problem solving OT - Cognition Comments: Pt continues to demonstrate deficits in STM as well as safety applications to precautions during functional tasks                 Following commands: Impaired Following commands impaired: Follows multi-step commands inconsistently, Follows one step commands with increased time      Cueing   Cueing Techniques: Verbal cues, Tactile cues  Exercises      Shoulder Instructions       General Comments VSS on RA, husband present and supportive (former critical care RN)    Pertinent Vitals/ Pain       Pain Assessment Pain Assessment: Faces Faces Pain Scale: Hurts a little bit Pain Location: back and LEs Pain Descriptors / Indicators: Discomfort,  Grimacing Pain Intervention(s): Monitored during session, Repositioned  Home Living                                          Prior Functioning/Environment              Frequency  Min 2X/week        Progress Toward Goals  OT Goals(current goals can now be found in the care plan section)  Progress towards OT goals: Progressing toward goals  Acute Rehab OT Goals Patient Stated Goal: get to rehab OT Goal Formulation: With patient Time For Goal Achievement: 05/23/24 Potential to Achieve Goals: Good  Plan      Co-evaluation                 AM-PAC OT 6 Clicks Daily Activity     Outcome Measure   Help from another person eating meals?: A Little Help from another person taking care of personal grooming?: A Little Help from another person toileting, which includes using toliet, bedpan, or urinal?: A Lot Help from another person bathing (including washing, rinsing, drying)?: A Lot Help from another person to put on and taking off regular upper body clothing?: A Little Help from another person to put on and taking off regular lower body clothing?: A Lot 6 Click Score: 15    End of Session Equipment Utilized During Treatment: Rolling walker (2 wheels);Back brace  OT Visit Diagnosis: Unsteadiness on feet (R26.81);Other abnormalities of gait and mobility (R26.89);Muscle weakness (generalized) (M62.81);History of falling (  Z91.81);Pain Pain - part of body:  (back, BLE)   Activity Tolerance Patient tolerated treatment well   Patient Left in chair;with call bell/phone within reach;with family/visitor present   Nurse Communication Mobility status        Time: 1032-1101 OT Time Calculation (min): 29 min  Charges: OT General Charges $OT Visit: 1 Visit OT Treatments $Self Care/Home Management : 23-37 mins  Leita DEL OTR/L Acute Rehabilitation Services Office: [phone]  Leita PARAS [facility] 05/14/2024, 2:04 PM

## 2024-05-14 NOTE — Progress Notes (Signed)
 Subjective: The patient is alert and pleasant.  She still at times slightly confused.  She has no complaints.  She does not feel like she is ready to go home.  She is awaiting rehab placement.  Objective: Vital signs in last 24 hours: Temp:  [98.1 F (36.7 C)-99.3 F (37.4 C)] 98.2 F (36.8 C) (11/19 0749) Pulse Rate:  [64-91] 91 (11/19 0749) Resp:  [16-18] 18 (11/19 0749) BP: (104-131)/(61-68) 111/68 (11/19 0749) SpO2:  [94 %-96 %] 94 % (11/19 0749) Estimated body mass index is 40.3 kg/m as calculated from the following:   Height as of this encounter: 4' 11 (1.499 m).   Weight as of this encounter: 90.5 kg.   Intake/Output from previous day: No intake/output data recorded. Intake/Output this shift: No intake/output data recorded.  Physical exam patient is alert and pleasant.  She is moving her lower extremities well.  Her lumbar incision is healing well.  Lab Results: Recent Labs    05/14/24 0437  WBC 21.1*  HGB 11.8*  HCT 35.3*  PLT 271   BMET Recent Labs    05/14/24 0437  NA 137  K 4.1  CL 98  CO2 27  GLUCOSE 122*  BUN 15  CREATININE 1.12*  CALCIUM  9.3    Studies/Results: No results found.  Assessment/Plan: Postop day #9: We are awaiting rehab placement.  I appreciate the hospitalist team's care for this patient.  LOS: 6 days     Jenny Yates 05/14/2024, 8:46 AM     Patient ID: Jenny Yates, female   DOB: [birth year], 63 y.o.   MRN: [id number]

## 2024-05-14 NOTE — Plan of Care (Signed)
  Problem: Education: Goal: Knowledge of General Education information will improve Description: Including pain rating scale, medication(s)/side effects and non-pharmacologic comfort measures Outcome: Adequate for Discharge   Problem: Health Behavior/Discharge Planning: Goal: Ability to manage health-related needs will improve Outcome: Adequate for Discharge   Problem: Clinical Measurements: Goal: Ability to maintain clinical measurements within normal limits will improve Outcome: Adequate for Discharge Goal: Will remain free from infection Outcome: Adequate for Discharge Goal: Diagnostic test results will improve Outcome: Adequate for Discharge Goal: Respiratory complications will improve Outcome: Adequate for Discharge Goal: Cardiovascular complication will be avoided Outcome: Adequate for Discharge   Problem: Nutrition: Goal: Adequate nutrition will be maintained Outcome: Adequate for Discharge   Problem: Coping: Goal: Level of anxiety will decrease Outcome: Adequate for Discharge   Problem: Elimination: Goal: Will not experience complications related to bowel motility Outcome: Adequate for Discharge Goal: Will not experience complications related to urinary retention Outcome: Adequate for Discharge   Problem: Safety: Goal: Ability to remain free from injury will improve Outcome: Adequate for Discharge   Problem: Skin Integrity: Goal: Risk for impaired skin integrity will decrease Outcome: Adequate for Discharge   Problem: Acute Rehab PT Goals(only PT should resolve) Goal: Pt Will Go Supine/Side To Sit Outcome: Adequate for Discharge Goal: Pt Will Go Sit To Supine/Side Outcome: Adequate for Discharge Goal: Patient Will Transfer Sit To/From Stand Outcome: Adequate for Discharge Goal: Pt Will Transfer Bed To Chair/Chair To Bed Outcome: Adequate for Discharge Goal: Pt Will Ambulate Outcome: Adequate for Discharge Goal: Pt Will Go Up/Down Stairs Outcome: Adequate  for Discharge Goal: Pt Will Verbalize and Adhere to Precautions While Description: PT Will Verbalize and Adhere to Precautions While Performing Mobility Outcome: Adequate for Discharge

## 2024-05-14 NOTE — Progress Notes (Addendum)
   Inpatient Rehabilitation Admissions Coordinator   I have approval and CIR bed to admit her to today. Acute team and TOC made aware. I will make the arrangements. I have paged Attending MD.  Heron Leavell, RN, MSN Rehab Admissions Coordinator [phone] 05/14/2024 9:39 AM

## 2024-05-14 NOTE — Discharge Summary (Addendum)
 Physician Discharge Summary  Patient ID: Jenny Yates MRN: [id number] DOB/AGE: [birth year] 63 y.o.  Admit date: 05/14/2024 Discharge date: 05/21/2024  Discharge Diagnoses:  Principal Problem:   Spinal stenosis of lumbar region Active Problems:   S/P lumbar fusion DVT prophylaxis Pain management AKI/hyponatremia Hypotension Hyperlipidemia Hypothyroidism B12 deficiency 13 x 6 mm ground glass nodule right lung apex Class III obesity History of alcohol and tobacco use Mood stabilization Collagenous colitis UTI  Discharged Condition: Stable  Significant Diagnostic Studies: DG Abd 1 View Result Date: 05/15/2024 CLINICAL DATA:  Constipation. EXAM: ABDOMEN - 1 VIEW COMPARISON:  None Available. FINDINGS: Mild colonic stool burden. No bowel dilatation or evidence of obstruction. No free air or radiopaque calculi. Right upper quadrant cholecystectomy clips. Lower lumbar spinal fusion hardware. No acute osseous pathology. IMPRESSION: Mild colonic stool burden. No bowel obstruction. Electronically Signed   By: Vanetta Chou M.D.   On: 05/15/2024 19:48   DG Chest 2 View Result Date: 05/14/2024 EXAM: 2 VIEW(S) XRAY OF THE CHEST 05/14/2024 05:58:00 PM COMPARISON: CT chest 05/25/2024, chest x-ray 05/08/2024. CLINICAL HISTORY: Leukocytosis. FINDINGS: LUNGS AND PLEURA: Low lung volumes. Perihilar interstitial thickening. No focal pulmonary opacity. No pleural effusion. No pneumothorax. HEART AND MEDIASTINUM: Unchanged cardiomediastinal silhouette. BONES AND SOFT TISSUES: No acute osseous abnormality. IMPRESSION: 1. Low lung volumes with perihilar interstitial thickening. Electronically signed by: Morgane Naveau MD 05/14/2024 11:28 PM EST RP Workstation: [id number]   MR BRAIN WO CONTRAST Result Date: 05/10/2024 EXAM: MRI BRAIN WITHOUT CONTRAST 05/10/2024 01:34:00 PM TECHNIQUE: Multiplanar multisequence MRI of the head/brain was performed without the administration of intravenous contrast.  COMPARISON: None available. CLINICAL HISTORY: Mental status change, unknown cause. FINDINGS: BRAIN AND VENTRICLES: Moderate generalized atrophy and white matter changes are advanced for age. White matter changes extend into the brainstem. No acute infarct. No intracranial hemorrhage. No mass. No midline shift. No hydrocephalus. The sella is unremarkable. Normal flow voids. ORBITS: No acute abnormality. SINUSES AND MASTOIDS: No acute abnormality. BONES AND SOFT TISSUES: Normal marrow signal. No acute soft tissue abnormality. IMPRESSION: 1. No acute findings. 2. Moderate generalized atrophy and advanced-for-age white matter changes extending into the brainstem. Electronically signed by: Lonni Necessary MD 05/10/2024 02:33 PM EST RP Workstation: [id number]   CT T-SPINE NO CHARGE Result Date: 05/08/2024 CLINICAL DATA:  63 year old with fall this morning and complaint of back pain. Had back surgery 2 weeks ago per husband, per patient she had back surgery 15 years ago, arrives with back brace EXAM: CT THORACIC SPINE WITHOUT CONTRAST; CT LUMBAR SPINE WITHOUT CONTRAST TECHNIQUE: Multidetector CT images of the thoracic and lumbar spine were obtained using the standard protocol without intravenous contrast. RADIATION DOSE REDUCTION: This exam was performed according to the departmental dose-optimization program which includes automated exposure control, adjustment of the mA and/or kV according to patient size and/or use of iterative reconstruction technique. COMPARISON:  MRI lumbar spine 03/23/2024, lumbar spine radiographs 02/04/2024 and 01/25/2024 FINDINGS: There are postsurgical changes relating to L4-L5 posterior spinal fusion with transpedicular screws, lateral rods and interbody device. There is 5 mm grade 1 anterolisthesis of L4 on L5, post fusion, similar to preoperative exam. The orthopedic hardware appears intact. The left L5 transpedicular screw tip extends slightly beyond the anterior L5 vertebral body  cortex. Alignment: There is otherwise no significant listhesis. Vertebrae: There is suggestion of nondisplaced fracture of the left L4 transverse process (series 3, image 57 and series 5, image 39) with possible extension into the facet adjacent to the transpedicular screw (series 3, image 57).  Otherwise no acute fracture or focal pathologic process. Again limbus L4 vertebra. Paraspinal and other soft tissues: Ill-defined right apical pulmonary ground-glass opacity, please see separately dictated report for CT chest, abdomen, and pelvis for further relevant evaluation. There is multilevel degenerative intervertebral disc height loss. IMPRESSION: Suspicion for nondisplaced fracture of the left L4 transverse process with possible extension adjacent to the transpedicular screw. Otherwise no acute fracture or traumatic malalignment of the thoracic or lumbar spine. Postsurgical changes relating to L4-L5 posterior spinal fusion. The orthopedic hardware appears intact. Electronically Signed   By: Prentice Spade M.D.   On: 05/08/2024 10:29   CT L-SPINE NO CHARGE Result Date: 05/08/2024 CLINICAL DATA:  63 year old with fall this morning and complaint of back pain. Had back surgery 2 weeks ago per husband, per patient she had back surgery 15 years ago, arrives with back brace EXAM: CT THORACIC SPINE WITHOUT CONTRAST; CT LUMBAR SPINE WITHOUT CONTRAST TECHNIQUE: Multidetector CT images of the thoracic and lumbar spine were obtained using the standard protocol without intravenous contrast. RADIATION DOSE REDUCTION: This exam was performed according to the departmental dose-optimization program which includes automated exposure control, adjustment of the mA and/or kV according to patient size and/or use of iterative reconstruction technique. COMPARISON:  MRI lumbar spine 03/23/2024, lumbar spine radiographs 02/04/2024 and 01/25/2024 FINDINGS: There are postsurgical changes relating to L4-L5 posterior spinal fusion with  transpedicular screws, lateral rods and interbody device. There is 5 mm grade 1 anterolisthesis of L4 on L5, post fusion, similar to preoperative exam. The orthopedic hardware appears intact. The left L5 transpedicular screw tip extends slightly beyond the anterior L5 vertebral body cortex. Alignment: There is otherwise no significant listhesis. Vertebrae: There is suggestion of nondisplaced fracture of the left L4 transverse process (series 3, image 57 and series 5, image 39) with possible extension into the facet adjacent to the transpedicular screw (series 3, image 57). Otherwise no acute fracture or focal pathologic process. Again limbus L4 vertebra. Paraspinal and other soft tissues: Ill-defined right apical pulmonary ground-glass opacity, please see separately dictated report for CT chest, abdomen, and pelvis for further relevant evaluation. There is multilevel degenerative intervertebral disc height loss. IMPRESSION: Suspicion for nondisplaced fracture of the left L4 transverse process with possible extension adjacent to the transpedicular screw. Otherwise no acute fracture or traumatic malalignment of the thoracic or lumbar spine. Postsurgical changes relating to L4-L5 posterior spinal fusion. The orthopedic hardware appears intact. Electronically Signed   By: Prentice Spade M.D.   On: 05/08/2024 10:29   CT CHEST ABDOMEN PELVIS WO CONTRAST Result Date: 05/08/2024 EXAM: CT CHEST, ABDOMEN AND PELVIS WITHOUT CONTRAST 05/08/2024 08:48:00 AM TECHNIQUE: CT of the chest, abdomen and pelvis was performed without the administration of intravenous contrast. Multiplanar reformatted images are provided for review. Automated exposure control, iterative reconstruction, and/or weight based adjustment of the mA/kV was utilized to reduce the radiation dose to as low as reasonably achievable. COMPARISON: None available. CLINICAL HISTORY: Polytrauma, blunt. FINDINGS: CHEST: MEDIASTINUM AND LYMPH NODES: Heart and  pericardium are unremarkable. The central airways are clear. No mediastinal, hilar or axillary lymphadenopathy. LUNGS AND PLEURA: Minimal bibasilar subsegmental atelectasis is noted. A 13 x 6 mm ground-glass opacity is seen in the right lung apex. Follow-up unenhanced chest CT in 3 months is recommended to ensure stability and rule out neoplasm. No focal consolidation or pulmonary edema. No pleural effusion or pneumothorax. ABDOMEN AND PELVIS: LIVER: The liver is unremarkable. GALLBLADDER AND BILE DUCTS: Status post cholecystectomy. No biliary ductal dilatation. SPLEEN:  No acute abnormality. PANCREAS: No acute abnormality. ADRENAL GLANDS: No acute abnormality. KIDNEYS, URETERS AND BLADDER: No stones in the kidneys or ureters. No hydronephrosis. No perinephric or periureteral stranding. Urinary bladder is unremarkable. GI AND BOWEL: Stomach demonstrates no acute abnormality. There is no bowel obstruction. REPRODUCTIVE ORGANS: No acute abnormality. PERITONEUM AND RETROPERITONEUM: No ascites. No free air. VASCULATURE: Aorta is normal in caliber. ABDOMINAL AND PELVIS LYMPH NODES: No lymphadenopathy. REPRODUCTIVE ORGANS: No acute abnormality. BONES AND SOFT TISSUES: No acute osseous abnormality. No focal soft tissue abnormality. IMPRESSION: 1. 13 x 6 mm ground-glass nodule in the right lung apex. Recommend chest CT at 612 months to confirm persistence; if persistent, then CT every 2 years until 5 years. If growth or solid component develops, consider resection. 2. No definite traumatic abnormality seen. Electronically signed by: Lynwood Seip MD 05/08/2024 09:38 AM EST RP Workstation: [id number]   CT CERVICAL SPINE WO CONTRAST Result Date: 05/08/2024 CLINICAL DATA:  63 year old with blunt poly trauma EXAM: CT CERVICAL SPINE WITHOUT CONTRAST TECHNIQUE: Multidetector CT imaging of the cervical spine was performed without intravenous contrast. Multiplanar CT image reconstructions were also generated. RADIATION DOSE  REDUCTION: This exam was performed according to the departmental dose-optimization program which includes automated exposure control, adjustment of the mA and/or kV according to patient size and/or use of iterative reconstruction technique. COMPARISON:  None Available. FINDINGS: Alignment: There is overall straightening of the normal cervical lordosis with multilevel trace, likely degenerative, anterolisthesis C2 on C3, C3 on C4, and C4 on C5 without evidence of acute or traumatic vertebral column listhesis. Skull base and vertebrae: No acute fracture. No primary bone lesion or focal pathologic process. Soft tissues and spinal canal: Limited evaluation of the spinal canal secondary to photon starvation artifact. Within this limitation, no high-grade canal stenosis is seen. Unremarkable extra-spinal soft tissues. Disc levels: There is multilevel uncovertebral spurring and facet arthropathy with up to moderate/ severe neural foraminal narrowing, generally worse on the left. Upper chest: See dedicated chest imaging. Other: None. IMPRESSION: No acute fracture or traumatic malalignment of the cervical spine. Electronically Signed   By: Prentice Spade M.D.   On: 05/08/2024 09:26   CT HEAD WO CONTRAST Result Date: 05/08/2024 CLINICAL DATA:  63 year old with moderate to severe head trauma EXAM: CT HEAD WITHOUT CONTRAST TECHNIQUE: Contiguous axial images were obtained from the base of the skull through the vertex without intravenous contrast. RADIATION DOSE REDUCTION: This exam was performed according to the departmental dose-optimization program which includes automated exposure control, adjustment of the mA and/or kV according to patient size and/or use of iterative reconstruction technique. COMPARISON:  None Available. FINDINGS: Brain: No evidence of acute infarction, hemorrhage, hydrocephalus, extra-axial collection or mass lesion/mass effect.Moderate subcortical and periventricular hypodensity which is nonspecific  but most likely represents the sequela chronic microvascular ischemia. Small chronic infarction left caudate head. Vascular: No hyperdense vessel or unexpected calcification. Skull: Normal. Negative for fracture or focal lesion. Sinuses/Orbits: No acute finding. Other: None. IMPRESSION: No acute intracranial hemorrhage or calvarial fracture. Electronically Signed   By: Prentice Spade M.D.   On: 05/08/2024 09:11   DG Chest Port 1 View Result Date: 05/08/2024 EXAM: 1 VIEW(S) XRAY OF THE CHEST 05/08/2024 08:30:00 AM COMPARISON: PA and lateral radiographs of the chest dated 02/04/2016. CLINICAL HISTORY: Trauma. FINDINGS: LUNGS AND PLEURA: Mild-to-moderate elevation of the right hemidiaphragm. No focal pulmonary opacity. No pleural effusion. No pneumothorax. HEART AND MEDIASTINUM: No acute abnormality of the cardiac and mediastinal silhouettes. BONES AND SOFT TISSUES: No acute  osseous abnormality. IMPRESSION: 1. No acute process. 2. Mild-to-moderate elevation of the right hemidiaphragm. Electronically signed by: Evalene Coho MD 05/08/2024 08:35 AM EST RP Workstation: [id number]   DG Pelvis Portable Result Date: 05/08/2024 EXAM: 1 or 2 view(s) Xray of the pelvis 05/08/2024 08:30:00 AM COMPARISON: None available. CLINICAL HISTORY: Trauma Trauma FINDINGS: BONES AND JOINTS: No acute fracture. No focal osseous lesion. No joint dislocation. SOFT TISSUES: The soft tissues are unremarkable. IMPRESSION: 1. No significant abnormality. Electronically signed by: Evalene Coho MD 05/08/2024 08:34 AM EST RP Workstation: [id number]    Labs:  Basic Metabolic Panel: Recent Labs  Lab 05/21/24 0448  NA 138  K 4.2  CL 102  CO2 26  GLUCOSE 120*  BUN 22  CREATININE 0.74  CALCIUM  9.0    CBC: Recent Labs  Lab 05/21/24 0448  WBC 8.0  HGB 10.4*  HCT 31.9*  MCV 94.9  PLT 327    CBG: No results for input(s): GLUCAP in the last 168 hours.  Brief HPI:   Jenny Yates is a 63 y.o. right-handed female  with history significant for hypertension anxiety with depression as well as panic disorder, hypothyroidism class III obesity BMI 40.30 hyperlipidemia tobacco and alcohol use chronic back pain status post L4-5 fusion 04/23/2024 per Dr. Mavis.  Presented 05/08/2024 after mechanical fall at home with some noted altered mental status as well as noted to be hypotensive.  Per chart review patient lives with spouse two-level home bed and bath on main level 2 steps to entry.  Patient uses a rolling walker for mobility since recent back surgery.  Cranial CT scan showed no acute intracranial abnormality as well as follow-up MRI of the brain showed moderate generalized atrophy no acute findings.  CT of the chest abdomen and pelvis showed a 13 x 6 mm ground glass nodule in the right lung apex recommending CT of the chest 6 to 12 months.  CT lumbar spine showed possible nondisplaced fracture of the left L4 transverse process with hardware intact.  Neurosurgery follow-up advised conservative care patient maintain a lumbar corset applied in sitting position.  Noted admission labs unremarkable except sodium 122 BUN 29 creatinine 1.52 WBC 11,500 urinalysis positive nitrite with rare bacteria but no UTI symptoms initially placed on empiric antibiotics since discontinued.  Magnesium  1.4 ammonia level 28.  In regards to patient's hyponatremia felt to be related to poor p.o. intake and HCTZ was discontinued.  She did receive IV fluid bolus with latest sodium 133 she did have mild vitamin B12 deficiency.  Required midodrine  initially for hypotension normalized midodrine  since discontinued.  In regards to patient's acute metabolic encephalopathy felt to be related to polypharmacy dehydration and mild AKI which improved.  Therapy evaluations completed due to patient's decreased functional mobility was admitted for a comprehensive rehab program.   Hospital Course: Jenny Yates was admitted to rehab 05/14/2024 for inpatient therapies  to consist of PT, ST and OT at least three hours five days a week. Past admission physiatrist, therapy team and rehab RN have worked together to provide customized collaborative inpatient rehab.  Pertaining to patient's L4-L5 PLIF 04/23/2024 per Dr. Mavis.  Pain management with low-dose oxycodone  as well as Celebrex .  Surgical site clean and dry back corset when out of bed.  Hospital course complicated by dehydration acute metabolic encephalopathy continue to improve monitoring of electrolytes.  Mood stabilization with history of anxiety depression continued on Xanax  as needed with emotional support provided.  AKI/hyponatremia with hypotension initially on ProAmatine  since  discontinued patient had been on HCTZ hydralazine  50 mg twice daily and Bystolic  20 mg daily with Benicar  20 mg daily prior to admission resumed as needed monitored with increased mobility.  Encouraged ongoing for fluid intake.  Crestor  ongoing for hyperlipidemia.  Hormone supplement with hypothyroidism.  B12 deficiency follow-up outpatient.  13 x 6 g glass nodule right lung apex follow-up outpatient 6 to 12 months for chest CT. patient with noted collagenous colitis maintained on Entocort and bowel program established.  Klebsiella UTI completing course of Duricef.  Class III obesity BMI 40.30 dietary follow-up.  History of alcohol tobacco use receiving counts regarding cessation of alcohol and tobacco exhibiting no signs of withdrawal.   Blood pressures were monitored on TID basis and remained soft and monitored     Rehab course: During patient's stay in rehab weekly team conferences were held to monitor patient's progress, set goals an  Media Information   Media Information   Document Information  Photos  Incision  05/14/2024 16:37  Attached To:  Hospital Encounter on 05/14/24  Source Information  Mont[place], Almeda CROME, RN  [facility]    Document Information  Photos    05/14/2024 17:31  Attached To:   Hospital Encounter on 05/14/24  Source Information  Urbano Albright, MD  [facility]  d discuss barriers to discharge. At admission, patient required minimal assist 150 feet rolling walker contact-guard sit to stand  He/She  has had improvement in activity tolerance, balance, postural control as well as ability to compensate for deficits. He/She has had improvement in functional use RUE/LUE  and RLE/LLE as well as improvement in awareness.  Working with energy conservation techniques.  Sessions emphasize functional strengthening endurance and independence with transfers and bed mobility.  Performed x 2 trials of the following sequence backward step onto step from rolling walker followed by sitting edge of bed contact-guard standby assist followed by logrolling to supine with contact-guard.  Performed supine to sit via logroll from flat bed with minimal assist.  Perform upper body dressing and PT assisted with lower body dressing.  Patient donned LSO brace in seated position with set up assist to place brace.  Ambulates 180 feet to the main gym using rolling walker for contact-guard.  ACE ins and descends 4-6 inch steps using single handrail with light minimal assist.  Full family teaching completed plan discharge to home       Disposition:  Discharge disposition: 01-Home or Self Care        Diet: Regular  Special Instructions: No driving smoking or alcohol  Back corset when out of bed applied in sitting position  Follow-up 6 to 12 months chest CT for monitoring of 13 x 6 mm ground glass nodule right lung apex  Medications at discharge. 1.  Tylenol  as needed 2.  Xanax  0.25 mg nightly as needed 3.  Entocort-AC 9 mg p.o. daily 4.  Celebrex  200 mg p.o. daily 5.  Synthroid  100 mcg p.o. daily 6.  Multivitamin daily 7.  Oxycodone  5 mg every 8 hours as needed moderate pain 8.  MiraLAX  daily as needed hold for loose stools 9.  Crestor  40 mg p.o. daily 10.  Thiamine  100  mg p.o. daily 11.  Colace 200 mg daily 12.  Avapro  75 mg daily 13.  Duricef 500 mg twice daily x 1 day   30-35 minutes were spent completing discharge summary and discharge planning     Follow-up Information     Fleeta Finger, Selinda,  MD Follow up.   Specialty: Internal Medicine Why: Call for appointment Contact information: [address] [place] [address] [phone]         Urbano Albright, MD Follow up.   Specialty: Physical Medicine and Rehabilitation Why: No formal follow-up needed Contact information: [address] [place] [address] [phone]         Mavis Purchase, MD Follow up.   Specialty: Neurosurgery Why: Call for appointment Contact information: [address] [place] [address] [phone]                 Signed: Toribio JINNY Pitch 05/27/2024, 3:54 PM

## 2024-05-14 NOTE — TOC Transition Note (Signed)
 Transition of Care [facility]) - Discharge Note   Patient Details  Name: DYAN LABARBERA MRN: [id number] Date of Birth: 1961/02/28  Transition of Care [facility]) CM/SW Contact:  Roxie KANDICE Stain, RN Phone Number: 05/14/2024, 10:17 AM   Clinical Narrative:     Patient will transfer to CIR today.  Final next level of care: IP Rehab Facility Barriers to Discharge: Barriers Resolved   Patient Goals and CMS Choice Patient states their goals for this hospitalization and ongoing recovery are:: CIR          Discharge Placement                 CIR      Discharge Plan and Services Additional resources added to the After Visit Summary for   In-house Referral: Clinical Social Work Discharge Planning Services: CM Consult Post Acute Care Choice: IP Rehab                               Social Drivers of Health (SDOH) Interventions SDOH Screenings   Food Insecurity: No Food Insecurity (05/08/2024)  Housing: Low Risk  (05/08/2024)  Transportation Needs: No Transportation Needs (05/08/2024)  Utilities: Not At Risk (05/08/2024)  Depression (PHQ2-9): Low Risk  (08/24/2023)  Tobacco Use: Medium Risk (05/09/2024)     Readmission Risk Interventions     No data to display

## 2024-05-14 NOTE — H&P (Signed)
 Physical Medicine and Rehabilitation Admission H&P        Chief Complaint  Patient presents with   Fall   Back Pain  : HPI: Jenny Yates is a 63 year old right-handed female with history significant for hypertension, anxiety/depression as well as panic disorders, hypothyroidism, class III obesity BMI 40.30, hyperlipidemia, tobacco/alcohol use and chronic back pain status post L4-5 fusion 04/23/2024 per Dr. Mavis who presented 05/08/2024 after mechanical fall at home with some noted altered mental status as well as noted to be hypotensive.  Per chart review patient lives with spouse.  Two-level home bed and bath main level 2 steps to entry.  She had been using a rolling walker for mobility since recent back surgery.  Cranial CT scan showed no acute intracranial abnormality as well as follow-up MRI of the brain showing moderate generalized atrophy no acute findings..  CT of the chest and abdomen showed a 13 x 6 mm ground glass nodule in the right lung apex recommending CT chest 6 to 12 months.  CT lumbar spine showed possible nondisplaced fracture of the left L4 transverse process with hardware intact.  Neurosurgery follow-up advised conservative care and patient is maintained in a lumbar corset applied in sitting position.  Noted admission labs unremarkable except sodium 122 BUN 29 creatinine 1.52, WBC 11,500, urinalysis positive nitrite with rare bacteria but no UTI symptoms she was initially placed on empiric antibiotics since discontinued, magnesium 1.4, ammonia level 28.  In regards to patient's hyponatremia felt to be related to poor p.o. intake and HCTZ which was discontinued.  She did receive IV fluid bolus with latest sodium 133 she did have a mild vitamin B12 deficiency.  She initially required midodrine for hypotension which normalized and midodrine has been discontinued.  In regards to patient's acute metabolic encephalopathy felt to be related to polypharmacy dehydration and mild AKI  which has improved.  Increased WBC today, husband reports hospitalist ordered UA, Cr a little higher, husband reports hospitalist recommended increased oral hydration.  Therapy evaluations completed due to patient decreased functional mobility was admitted for a comprehensive rehab program.   Review of Systems  Constitutional:  Positive for fever (low grade). Negative for chills.  HENT:  Negative for hearing loss.   Eyes:  Negative for blurred vision and double vision.  Respiratory:  Negative for cough, shortness of breath and wheezing.   Cardiovascular:  Negative for chest pain, leg swelling and PND.  Gastrointestinal:  Positive for constipation. Negative for heartburn, nausea and vomiting.  Genitourinary:  Negative for dysuria, flank pain and hematuria.  Musculoskeletal:  Positive for back pain, falls and myalgias.  Skin:  Negative for rash.  Neurological:  Positive for weakness and headaches.  Psychiatric/Behavioral:  Positive for depression. The patient is nervous/anxious and has insomnia.        Anxiety as well as panic attacks  All other systems reviewed and are negative.      Past Medical History:  Diagnosis Date   Anemia     Anxiety     Collagenous colitis     Depression     Hyperlipidemia     Hypertension     Hypothyroidism     Lumbar herniated disc     Panic disorder               Past Surgical History:  Procedure Laterality Date   BACK SURGERY        2007   HERNIA REPAIR  TUBAL LIGATION            History reviewed. No pertinent family history.     Social History:  reports that she has quit smoking. Her smoking use included cigarettes. She has never used smokeless tobacco. She reports that she does not currently use alcohol. She reports that she does not use drugs. Allergies:  Allergies       Allergies  Allergen Reactions   Codeine Other (See Comments)      Syncope knocks me to the floor   Epinephrine  Other (See Comments)      Oral seizure-like  activity   Azithromycin Rash   Iodine Swelling and Rash            Medications Prior to Admission  Medication Sig Dispense Refill   acetaminophen  (TYLENOL ) 500 MG tablet Take 500 mg by mouth in the morning, at noon, in the evening, and at bedtime. 9am, 3pm, 9pm, 3am       ALPRAZolam  (XANAX ) 0.5 MG tablet Take 1 tablet (0.5 mg total) by mouth 3 (three) times daily as needed. (Patient taking differently: Take 0.5 mg by mouth 3 (three) times daily.) 90 tablet 2   budesonide  (ENTOCORT EC ) 3 MG 24 hr capsule TAKE 3 CAPSULES BY MOUTH EVERY DAY 270 capsule 1   celecoxib  (CELEBREX ) 200 MG capsule Take 200 mg by mouth daily.       Cholecalciferol (VITAMIN D3 PO) Take 1,000 Units by mouth in the morning and at bedtime.       cyanocobalamin  (VITAMIN B12) 1000 MCG tablet Take 1,000 mcg by mouth in the morning.       cyclobenzaprine  (FLEXERIL ) 5 MG tablet Take 1 tablet (5 mg total) by mouth 3 (three) times daily as needed for muscle spasms. (Patient taking differently: Take 5 mg by mouth 3 (three) times daily. 6am, 3pm, midnight) 30 tablet 0   diphenhydrAMINE HCl (BENADRYL PO) Take 1 tablet by mouth as needed (sleep).       hydrALAZINE  (APRESOLINE ) 50 MG tablet Take 1 tablet (50 mg total) by mouth in the morning and at bedtime. TAKE 1 TABLET 2 TIMES DAILYWITH FOOD 180 tablet 1   hydrochlorothiazide  (HYDRODIURIL ) 25 MG tablet TAKE 1 TABLET DAILY 90 tablet 1   lactose free nutrition (BOOST) LIQD Take 237 mLs by mouth daily.       levothyroxine  (SYNTHROID ) 100 MCG tablet Take 1 tablet by mouth once daily 30 tablet 0   Multiple Vitamin (MULTIVITAMIN WITH MINERALS) TABS tablet Take 1 tablet by mouth in the morning.       nebivolol  (BYSTOLIC ) 10 MG tablet Take 2 tablets (20 mg total) by mouth daily. (Patient taking differently: Take 20 mg by mouth at bedtime.) 180 tablet 1   olmesartan  (BENICAR ) 20 MG tablet Take 1 tablet (20 mg total) by mouth in the morning and at bedtime. 180 tablet 3    oxyCODONE -acetaminophen  (PERCOCET/ROXICET) 5-325 MG tablet Take 1-2 tablets by mouth every 6 (six) hours as needed for pain (Patient taking differently: Take 1 tablet by mouth every 6 (six) hours as needed for moderate pain (pain score 4-6) or severe pain (pain score 7-10). 6am, noon, 6pm, midnight) 40 tablet 0   rosuvastatin  (CRESTOR ) 40 MG tablet Take 1 tablet (40 mg total) by mouth daily. (Patient taking differently: Take 40 mg by mouth at bedtime.) 30 tablet 3              Home: Home Living Family/patient expects to be discharged to:: Private residence  Living Arrangements: Spouse/significant other Available Help at Discharge: Family, Available 24 hours/day Type of Home: House Home Access: Stairs to enter [employer] of Steps: 2 (2 + a large threshold step) Entrance Stairs-Rails: Right, Left, Can reach both Home Layout: Two level, Able to live on main level with bedroom/bathroom Alternate Level Stairs-Number of Steps: flight Bathroom Shower/Tub: Engineer, Manufacturing Systems: Standard Bathroom Accessibility: Yes Home Equipment: Information systems manager, Grab bars - tub/shower, Medical Laboratory Scientific Officer - single point Additional Comments: spouse retired Insurance Underwriter  Lives With: Spouse   Functional History: Prior Function Prior Level of Function : Independent/Modified Independent Mobility Comments: youth RW for all mobility since back surgery. pts husband assists with all transfers and mobility. Pt tolerates ~51ft. 4x/day per husband ADLs Comments: pt's husband assists with all aspects of ADLs, sponge bathing only since surgery. [name] husband also reports impaired cognition since surgery   Functional Status:  Mobility: Bed Mobility General bed mobility comments: OOB on arrival standing at sink with spouse Transfers Overall transfer level: Needs assistance Equipment used: Rolling walker (2 wheels) Transfers: Sit to/from Stand Sit to Stand: Contact guard assist General transfer comment: cues needed  for hand placement, increased time Ambulation/Gait Ambulation/Gait assistance: Min assist Gait Distance (Feet): 150 Feet Assistive device: Rolling walker (2 wheels) Gait Pattern/deviations: Decreased stride length, Step-to pattern, Decreased step length - right, Decreased step length - left, Decreased dorsiflexion - right, Decreased dorsiflexion - left, Shuffle, Wide base of support General Gait Details: Pt with a very wide BOS and small steps with poor dorsiflexion resulting in minimal foot clearance, worse with onset of fatigue. multiple standing rest breaks, max directional cues during turning as pt unable to sequence or complete directional task without max directional cues. Pt was able to read room numbers on the R hand side but had to stop in front of every room and could not complete a dual task Gait velocity: reduced Gait velocity interpretation: <1.31 ft/sec, indicative of household ambulator   ADL: ADL Overall ADL's : Needs assistance/impaired Eating/Feeding: Minimal assistance, Sitting Grooming: Minimal assistance, Sitting Upper Body Bathing: Minimal assistance, Sitting Lower Body Bathing: Moderate assistance, Sitting/lateral leans, Sit to/from stand Lower Body Bathing Details (indicate cue type and reason): assist for peri area and feet Upper Body Dressing : Set up, Sitting Lower Body Dressing: Total assistance Lower Body Dressing Details (indicate cue type and reason): total A for socks Toilet Transfer: Contact guard assist, Ambulation Toilet Transfer Details (indicate cue type and reason): functional ambulation WFL with RW, cues for hand placement Toileting- Clothing Manipulation and Hygiene: Total assistance, Sit to/from stand Functional mobility during ADLs: Contact guard assist, Rolling walker (2 wheels) General ADL Comments: continues to needs assist for LB tasks and cognition. Pt took a full shower with assist, she did dequence through most ADLs WFL but needed cues for  safety   Cognition: Cognition Orientation Level: Oriented X4 Cognition Arousal: Alert Behavior During Therapy: Flat affect   Physical Exam: Blood pressure 111/68, pulse 91, temperature 98.2 F (36.8 C), temperature source Oral, resp. rate 18, height 4' 11 (1.499 m), weight 90.5 kg, SpO2 94%.   General: No apparent distress HEENT: Head is normocephalic, atraumatic, sclera anicteric, oral mucosa dry, dentition intact, wearing glasses Neck: Supple without JVD or lymphadenopathy Heart: Reg rate and rhythm. No murmurs rubs or gallops Chest: CTA bilaterally without wheezes, rales, or rhonchi; no distress Abdomen: Soft, non-tender, non-distended, bowel sounds positive. Extremities: No clubbing, cyanosis, or edema. Pulses are 2+ Psych: [name] affect is appropriate. Pt is cooperative.  pleasant Skin: small amount of drainage noted from wound- please see images, seen by Dr. Dalene today per husband Neuro:     Mental Status: AAOx3, responses a little delayed Speech/Languate: Naming and repetition intact, fluent, follows simple commands CRANIAL NERVES: II: PERRL. Visual fields full III, IV, VI: EOM intact, no gaze preference or deviation V: normal sensation bilaterally VII: no asymmetry VIII: normal hearing to speech IX, X: normal palatal elevation XI: head turn intact  XII: Tongue midline     MOTOR: RUE: 4-/5 Deltoid, 4/5 Biceps, 4/5 Triceps,4/5 Grip LUE: 4-/5 Deltoid, 4/5 Biceps, 4/5 Triceps, 4/5 Grip RLE: HF 3/5, KE 4/5, ADF 4/5, APF 4/5 LLE: HF 3/5, KE 4/5, ADF 4/5, APF 4/5     REFLEXES:no hyperreflexia noted, no ankle clonus   SENSORY: Normal to touch all 4 extremities   Coordination: Normal finger to nose b/l    MSK: no joint swelling       Lab Results Last 48 Hours        Results for orders placed or performed during the hospital encounter of 05/08/24 (from the past 48 hours)  CBC     Status: Abnormal    Collection Time: 05/14/24  4:37 AM  Result Value Ref Range     WBC 21.1 (H) 4.0 - 10.5 K/uL    RBC 3.69 (L) 3.87 - 5.11 MIL/uL    Hemoglobin 11.8 (L) 12.0 - 15.0 g/dL    HCT 64.6 (L) 63.9 - 46.0 %    MCV 95.7 80.0 - 100.0 fL    MCH 32.0 26.0 - 34.0 pg    MCHC 33.4 30.0 - 36.0 g/dL    RDW 86.9 88.4 - 84.4 %    Platelets 271 150 - 400 K/uL    nRBC 0.0 0.0 - 0.2 %      Comment: Performed at [facility], [address] [place], [place] [address]  Basic metabolic panel     Status: Abnormal    Collection Time: 05/14/24  4:37 AM  Result Value Ref Range    Sodium 137 135 - 145 mmol/L    Potassium 4.1 3.5 - 5.1 mmol/L    Chloride 98 98 - 111 mmol/L    CO2 27 22 - 32 mmol/L    Glucose, Bld 122 (H) 70 - 99 mg/dL      Comment: Glucose reference range applies only to samples taken after fasting for at least 8 hours.    BUN 15 8 - 23 mg/dL    Creatinine, Ser 8.87 (H) 0.44 - 1.00 mg/dL    Calcium  9.3 8.9 - 10.3 mg/dL    GFR, Estimated 55 (L) >60 mL/min      Comment: (NOTE) Calculated using the CKD-EPI Creatinine Equation (2021)      Anion gap 12 5 - 15      Comment: Performed at [facility], [address] [place], [place] [address]      Imaging Results (Last 48 hours)  No results found.         Blood pressure 111/68, pulse 91, temperature 98.2 F (36.8 C), temperature source Oral, resp. rate 18, height 4' 11 (1.499 m), weight 90.5 kg, SpO2 94%.   Medical Problem List and Plan: 1. Functional deficits secondary to history of L4-L5 PLIF 04/23/2024 by Dr. Mavis complicated by dehydration/acute metabolic encephalopathy             -patient may shower-cover incision please              -  ELOS/Goals: 7-10 days, sup PT, Sup to min A OT             -Admit to CIR 2.  Antithrombotics: -DVT/anticoagulation:  Mechanical: Antiembolism stockings, thigh (TED hose) Bilateral lower extremities             -antiplatelet therapy: N/A 3. Pain Management: Celebrex  200 mg daily, oxycodone  5 mg every 8 hours as needed moderate pain 4.  Mood/Behavior/Sleep/anxiety/panic attacks.  Xanax  0.25 mg nightly as needed             -antipsychotic agents: N/A 5. Neuropsych/cognition: This patient is capable of making decisions on her own behalf. 6. Skin/Wound Care: Routine skin check 7. Fluids/Electrolytes/Nutrition: Routine in and outs with follow-up chemistries 8.  AKI/hyponatremia/hypotension.  Patient initially with ProAmatine  since discontinued.  Prior to admission patient on HCTZ 25 mg daily, hydralazine  50 mg twice daily, Bystolic  20 mg daily, Benicar  20 mg twice daily.  Monitor with increased mobility and resume as needed             -11/19 Cr/BUN was a little higher this AM, encourage oral fluids, recheck tomorrow  9.  Hyperlipidemia.  Crestor  10.  Hypothyroidism.  Synthroid  11.  B12 deficiency.  Follow-up outpatient 12.  13 x 6 mm ground glass nodule right lung apex.  Follow-up outpatient 6 to 12 months chest CT. 13.  Class III obesity.  BMI 40.30.  Dietary follow-up 14.  History of alcohol and tobacco use.  Provide counseling 15. Leukocytosis             -Check CXR, UA, recheck tomorrow         Toribio JINNY Pitch, PA-C 05/14/2024   I have personally performed a face to face diagnostic evaluation of this patient and formulated the key components of the plan.  Additionally, I have personally reviewed laboratory data, imaging studies, as well as relevant notes and concur with the physician assistant's documentation above.   The patient's status has not changed from the original H&P.  Any changes in documentation from the acute care chart have been noted above.   Murray Collier, MD

## 2024-05-14 NOTE — Progress Notes (Signed)
 Urbano Albright, MD  Physician Physical Medicine and Rehabilitation   PMR Pre-admission    Signed   Date of Service: 05/09/2024  4:05 PM  Related encounter: ED to Hosp-Admission (Current) from 05/08/2024 in [facility] GENERAL MED/SURG UNIT   Signed     Expand All Collapse All  Show:Clear all [x] Written[x] Templated[x] Copied  Added by: [x] Alison Heron MATSU, RN[x] Urbano Albright, MD  [] Hover for details PMR Admission Coordinator Pre-Admission Assessment   Patient: Jenny Yates is an 63 y.o., female MRN: [id number] DOB: 1960-10-30 Height: 4' 11 (149.9 cm) Weight: 90.5 kg   Insurance Information HMO:     PPO:      PCP:      IPA:      80/20:      OTHER: Bronze S [employer] network ( marketplace) PRIMARY: [employer] QHP      Policy#: [id number]      Subscriber: pt CM Name: CM      Phone#: [phone]     Fax#: [phone] Pre-Cert#: [id number]  auth for CIR from faxed approval with [employer] for admit 05/13/24 with next review date 05/19/24.  Updates due to [phone] fax .  Benefits:  Phone #: [phone]     Name: 11/14 Eff. Date: 06/27/23 until 06/25/24     Deduct: $7500 ( met $6678.38)      Out of Pocket Max: $9200 (met)      Life Max: none CIR: 50%      SNF: 50% limit 90 days Outpatient: $50 per visit     Co-Pay:  Home Health: $50 per visit      Co-Pay:  DME: 50%     Co-Pay: 50% Providers: in network  SECONDARY: none      Policy#:      Phone#:    Artist:       Phone#:    The Data Processing Manager" for patients in Inpatient Rehabilitation Facilities with attached "Privacy Act Statement-Health Care Records" was provided and verbally reviewed with: Patient and Family   Emergency Contact Information Contact Information       Name Relation Home Work Mobile    [place] Spouse     [phone]         Other Contacts   None on File        Current Medical History  Patient Admitting Diagnosis: Debility   History of  Present Illness:    Jenny Yates is a 63 year old right-handed female with history significant for hypertension, anxiety/depression as well as panic disorders, hypothyroidism, class III obesity BMI 40.30, hyperlipidemia, tobacco/alcohol use and chronic back pain status post L4-5 fusion 04/23/2024 per Dr. Mavis who presented 05/08/2024 after mechanical fall at home with some noted altered mental status as well as noted to be hypotensive.      Cranial CT scan showed no acute intracranial abnormality as well as follow-up MRI of the brain showing moderate generalized atrophy no acute findings..  CT of the chest and abdomen showed a 13 x 6 mm ground glass nodule in the right lung apex recommending CT chest 6 to 12 months.  CT lumbar spine showed possible nondisplaced fracture of the left L4 transverse process with hardware intact.  Neurosurgery follow-up advised conservative care and patient is maintained in a lumbar corset applied in sitting position.  Noted admission labs unremarkable except sodium 122 BUN 29 creatinine 1.52, WBC 11,500, urinalysis positive nitrite with rare bacteria but no UTI symptoms she was initially placed on empiric antibiotics since  discontinued, magnesium 1.4, ammonia level 28.  In regards to patient's hyponatremia felt to be related to poor p.o. intake and HCTZ which was discontinued.  She did receive IV fluid bolus with latest sodium 133 she did have a mild vitamin B12 deficiency.  She initially required midodrine for hypotension which normalized and midodrine has been discontinued.  In regards to patient's acute metabolic encephalopathy felt to be related to polypharmacy dehydration and mild AKI which has improved.    Patient's medical record from [facility] has been reviewed by the rehabilitation admission coordinator and physician.   Past Medical History      Past Medical History:  Diagnosis Date   Anemia     Anxiety     Collagenous colitis     Depression      Hyperlipidemia     Hypertension     Hypothyroidism     Lumbar herniated disc     Panic disorder          Has the patient had major surgery during 100 days prior to admission? Yes   Family History   family history is not on file.   Current Medications  Current Medications    Current Facility-Administered Medications:    0.9 %  sodium chloride infusion, , Intravenous, Continuous, Al-Sultani, Anmar, MD   acetaminophen  (TYLENOL ) tablet 650 mg, 650 mg, Oral, Q6H PRN, 650 mg at 05/13/24 1648 **OR** acetaminophen  (TYLENOL ) suppository 650 mg, 650 mg, Rectal, Q6H PRN, Ghimire, Shanker M, MD   albuterol (PROVENTIL) (2.5 MG/3ML) 0.083% nebulizer solution 2.5 mg, 2.5 mg, Nebulization, Q2H PRN, Ghimire, Donalda HERO, MD   ALPRAZolam  (XANAX ) tablet 0.25 mg, 0.25 mg, Oral, QHS PRN, Hongalgi, Anand D, MD, 0.25 mg at 05/14/24 0235   bisacodyl (DULCOLAX) suppository 10 mg, 10 mg, Rectal, Daily PRN, Ghimire, Donalda HERO, MD   budesonide  (ENTOCORT EC ) 24 hr capsule 9 mg, 9 mg, Oral, Daily, Ghimire, Shanker M, MD, 9 mg at 05/14/24 1002   celecoxib  (CELEBREX ) capsule 200 mg, 200 mg, Oral, Daily, Hongalgi, Anand D, MD, 200 mg at 05/14/24 1002   fluticasone  (FLONASE ) 50 MCG/ACT nasal spray 1 spray, 1 spray, Each Nare, Daily PRN, Ghimire, Donalda HERO, MD   lactose free nutrition (Boost) liquid 237 mL, 237 mL, Oral, Daily, Hongalgi, Anand D, MD, 237 mL at 05/12/24 9173   levothyroxine  (SYNTHROID ) tablet 100 mcg, 100 mcg, Oral, Q0600, Ghimire, Shanker M, MD, 100 mcg at 05/14/24 0607   multivitamin with minerals tablet 1 tablet, 1 tablet, Oral, q AM, Ghimire, Donalda HERO, MD, 1 tablet at 05/14/24 9392   Oral care mouth rinse, 15 mL, Mouth Rinse, PRN, Hongalgi, Anand D, MD   oxyCODONE (Oxy IR/ROXICODONE) immediate release tablet 5 mg, 5 mg, Oral, Q8H PRN, Hongalgi, Anand D, MD   polyethylene glycol (MIRALAX / GLYCOLAX) packet 17 g, 17 g, Oral, Daily PRN, Ghimire, Donalda HERO, MD   rosuvastatin  (CRESTOR ) tablet 40 mg, 40  mg, Oral, Daily, Ghimire, Shanker M, MD, 40 mg at 05/14/24 1002   thiamine (VITAMIN B1) tablet 100 mg, 100 mg, Oral, Daily, Hongalgi, Anand D, MD, 100 mg at 05/14/24 1002     Patients Current Diet:  Diet Order                  Diet Heart Room service appropriate? Yes; Fluid consistency: Thin  Diet effective now                       Precautions /  Restrictions Precautions Precautions: Fall, Back Precaution Booklet Issued: No Precaution/Restrictions Comments: pt able to recall 2/3 precautions with increased time but requires max verbal cues to adhere functionally Spinal Brace: Lumbar corset, Applied in sitting position Restrictions Weight Bearing Restrictions Per Provider Order: No    Has the patient had 2 or more falls or a fall with injury in the past year? Yes   Prior Activity Level Community (5-7x/wk): prior to 4 25, independent with adls; decline since April   Prior Functional Level Self Care: Did the patient need help bathing, dressing, using the toilet or eating? Needed some help   Indoor Mobility: Did the patient need assistance with walking from room to room (with or without device)? Needed some help   Stairs: Did the patient need assistance with internal or external stairs (with or without device)? Needed some help   Functional Cognition: Did the patient need help planning regular tasks such as shopping or remembering to take medications? Needed some help   Patient Information Are you of Hispanic, Latino/a,or Spanish origin?: A. No, not of Hispanic, Latino/a, or Spanish origin What is your race?: A. White Do you need or want an interpreter to communicate with a doctor or health care staff?: 0. No   Patient's Response To:  Health Literacy and Transportation Is the patient able to respond to health literacy and transportation needs?: No Health Literacy - How often do you need to have someone help you when you read instructions, pamphlets, or other written material  from your doctor or pharmacy?: Patient unable to respond In the past 12 months, has lack of transportation kept you from medical appointments or from getting medications?: No In the past 12 months, has lack of transportation kept you from meetings, work, or from getting things needed for daily living?: No Higher Education Careers Adviser obtained via proxy:  (spouse answered)   Journalist, Newspaper / Equipment Home Equipment: Shower seat, Grab bars - tub/shower, Medical Laboratory Scientific Officer - single point   Prior Device Use: Indicate devices/aids used by the patient prior to current illness, exacerbation or injury? Walker   Current Functional Level Cognition   Orientation Level: Oriented X4    Extremity Assessment (includes Sensation/Coordination)   Upper Extremity Assessment: Generalized weakness  Lower Extremity Assessment: Defer to PT evaluation RLE Deficits / Details: grossly 4/5 bil with MMT; erythema, edema, and warm to palpation bil lower legs (R>L), husband reports worse than baseline potentially due to not wearing TED hose; denied numbness/tingling LLE Deficits / Details: grossly 4/5 bil with MMT; erythema, edema, and warm to palpation bil lower legs (R>L), husband reports worse than baseline potentially due to not wearing TED hose; denied numbness/tingling     ADLs   Overall ADL's : Needs assistance/impaired Eating/Feeding: Minimal assistance, Sitting Grooming: Minimal assistance, Sitting Upper Body Bathing: Minimal assistance, With adaptive equipment, Cueing for sequencing, Sitting Upper Body Bathing Details (indicate cue type and reason): practiced with long handle sponge Lower Body Bathing: Moderate assistance, Cueing for sequencing, Cueing for back precautions, With adaptive equipment, Sit to/from stand Lower Body Bathing Details (indicate cue type and reason): demonstrated with long handle sponge, cues for back precautions Upper Body Dressing : Moderate assistance Upper Body Dressing Details  (indicate cue type and reason): donning brace, husband assisted Lower Body Dressing: Maximal assistance, Sit to/from stand, With adaptive equipment Lower Body Dressing Details (indicate cue type and reason): husband can and does perform but practiced with sock donner this session as well as verbal education for [employer]  Transfer: Contact guard assist, Ambulation, Rolling walker (2 wheels) Toilet Transfer Details (indicate cue type and reason): functional ambulation WFL with RW, cues for hand placement Toileting- Clothing Manipulation and Hygiene: Maximal assistance, Sit to/from stand Toileting - Clothing Manipulation Details (indicate cue type and reason): educated in toilet aide Functional mobility during ADLs: Contact guard assist, Rolling walker (2 wheels) General ADL Comments: continues to needs assist for LB tasks and cognition. Pt took a full shower with assist, she did dequence through most ADLs WFL but needed cues for safety     Mobility   General bed mobility comments: walking in hallway with RN upon arrival, in recliner at the end     Transfers   Overall transfer level: Needs assistance Equipment used: Rolling walker (2 wheels) Transfers: Sit to/from Stand Sit to Stand: Contact guard assist General transfer comment: cues needed for hand placement, increased time - from elevated surface due to height of Pt     Ambulation / Gait / Stairs / Wheelchair Mobility   Ambulation/Gait Ambulation/Gait assistance: Editor, Commissioning (Feet): 150 Feet Assistive device: Rolling walker (2 wheels) Gait Pattern/deviations: Decreased stride length, Step-to pattern, Decreased step length - right, Decreased step length - left, Decreased dorsiflexion - right, Decreased dorsiflexion - left, Shuffle, Wide base of support General Gait Details: Pt with a very wide BOS and small steps with poor dorsiflexion resulting in minimal foot clearance, worse with onset of fatigue. multiple  standing rest breaks, max directional cues during turning as pt unable to sequence or complete directional task without max directional cues. Pt was able to read room numbers on the R hand side but had to stop in front of every room and could not complete a dual task Gait velocity: reduced Gait velocity interpretation: <1.31 ft/sec, indicative of household ambulator     Posture / Balance Balance Overall balance assessment: Needs assistance Sitting-balance support: Feet supported, No upper extremity supported Sitting balance-Leahy Scale: Fair Postural control: Posterior lean Standing balance support: Bilateral upper extremity supported, During functional activity, Reliant on assistive device for balance Standing balance-Leahy Scale: Poor Standing balance comment: reliant on RW or external support in static or dynamic standing balance     Special considerations/life events  Fall precautions orthostatic    Previous Home Environment  Living Arrangements: Spouse/significant other  Lives With: Spouse Available Help at Discharge: Family, Available 24 hours/day Type of Home: House Home Layout: Two level, Able to live on main level with bedroom/bathroom Alternate Level Stairs-Number of Steps: flight Home Access: Stairs to enter Entrance Stairs-Rails: Right, Left, Can reach both Entrance Stairs-Number of Steps: 2 (2 + a large threshold step) Bathroom Shower/Tub: Engineer, Manufacturing Systems: Standard Bathroom Accessibility: Yes How Accessible: Accessible via walker Home Care Services: No Additional Comments: spouse retired ICU nurse   Discharge Living Setting Plans for Discharge Living Setting: Patient's home, House, Lives with (comment) (spouse) Type of Home at Discharge: House Discharge Home Layout: Two level, Able to live on main level with bedroom/bathroom Alternate Level Stairs-Number of Steps: flight Discharge Home Access: Stairs to enter Entrance Stairs-Rails: Right, Left, Can  reach both Entrance Stairs-Number of Steps: 2 + 2 large threshold Discharge Bathroom Shower/Tub: Tub/shower unit Discharge Bathroom Toilet: Standard Discharge Bathroom Accessibility: Yes How Accessible: Accessible via walker Does the patient have any problems obtaining your medications?: No   Social/Family/Support Systems Patient Roles: Spouse Contact Information: spouse Anticipated Caregiver: spouse Anticipated Caregiver's Contact Information: see contacts Ability/Limitations of Caregiver: no limitaitons; retired Engineer, 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  Discharge Plan Discussed with Primary Caregiver: Yes Is Caregiver In Agreement with Plan?: Yes Does Caregiver/Family have Issues with Lodging/Transportation while Pt is in Rehab?: No   Goals Patient/Family Goal for Rehab: supervision PT, supervision to min OT Expected length of stay: ELOS 7 to 10 days Pt/Family Agrees to Admission and willing to participate: Yes Program Orientation Provided & Reviewed with Pt/Caregiver Including Roles  & Responsibilities: Yes   Decrease burden of Care through IP rehab admission: n/a   Possible need for SNF placement upon discharge: not anticipated   Patient Condition: This patient's condition remains as documented in the consult dated 05/12/24, in which the Rehabilitation Physician determined and documented that the patient's condition is appropriate for intensive rehabilitative care in an inpatient rehabilitation facility. Will admit to inpatient rehab today.   Preadmission Screen Completed By:  Alison Heron Lot, RN MSN 05/14/2024 12:05 PM ______________________________________________________________________   Discussed status with Dr. Urbano on 05/14/24 at 1206 and received approval for admission today.   Admission Coordinator:  Alison Heron Lot, RN MSN time [id number] Date 05/14/24    Assessment/Plan: Diagnosis: L4-L5 PLIF 04/23/2024 by Dr. Mavis complicated by dehydration/acute  metabolic encephalopathy  Does the need for close, 24 hr/day Medical supervision in concert with the patient's rehab needs make it unreasonable for this patient to be served in a less intensive setting? Yes Co-Morbidities requiring supervision/potential complications: hyponatremia, hypotension, hld, hypothyroidism, B12 deficiency, nodule R lung, Obesity, hx of alcohol abuse Due to bladder management, bowel management, safety, skin/wound care, disease management, medication administration, pain management, and patient education, does the patient require 24 hr/day rehab nursing? Yes Does the patient require coordinated care of a physician, rehab nurse, PT, OT, and SLP to address physical and functional deficits in the context of the above medical diagnosis(es)? Yes Addressing deficits in the following areas: balance, endurance, locomotion, strength, transferring, bowel/bladder control, bathing, dressing, feeding, grooming, toileting, and psychosocial support Can the patient actively participate in an intensive therapy program of at least 3 hrs of therapy 5 days a week? Yes The potential for patient to make measurable gains while on inpatient rehab is excellent Anticipated functional outcomes upon discharge from inpatient rehab: supervision PT, supervision and min assist OT, n/a SLP Estimated rehab length of stay to reach the above functional goals is: 7-10 Anticipated discharge destination: Home 10. Overall Rehab/Functional Prognosis: excellent     MD Signature: Murray Urbano          Revision History  Date/Time User Provider Type Action  05/14/2024 12:09 PM Urbano Murray, MD Physician Sign  05/14/2024 12:06 PM Alison Heron MATSU, RN Rehab Admission Coordinator Share  05/14/2024 11:47 AM Alison Heron MATSU, RN Rehab Admission Coordinator Share  05/14/2024  9:01 AM Alison Heron MATSU, RN Rehab Admission Coordinator Share  05/14/2024  8:47 AM Alison Heron MATSU, RN Rehab Admission  Coordinator Share  05/11/2024 11:42 AM Alison Heron MATSU, RN Rehab Admission Coordinator Share  05/09/2024  4:33 PM Alison Heron MATSU, RN Rehab Admission Coordinator Share   View Details Report

## 2024-05-14 NOTE — H&P (Addendum)
 Physical Medicine and Rehabilitation Admission H&P    Chief Complaint  Patient presents with   Fall   Back Pain  : HPI: Jenny Yates is a 63 year old right-handed female with history significant for hypertension, anxiety/depression as well as panic disorders, hypothyroidism, class III obesity BMI 40.30, hyperlipidemia, tobacco/alcohol use and chronic back pain status post L4-5 fusion 04/23/2024 per Dr. Mavis who presented 05/08/2024 after mechanical fall at home with some noted altered mental status as well as noted to be hypotensive.  Per chart review patient lives with spouse.  Two-level home bed and bath main level 2 steps to entry.  She had been using a rolling walker for mobility since recent back surgery.  Cranial CT scan showed no acute intracranial abnormality as well as follow-up MRI of the brain showing moderate generalized atrophy no acute findings..  CT of the chest and abdomen showed a 13 x 6 mm ground glass nodule in the right lung apex recommending CT chest 6 to 12 months.  CT lumbar spine showed possible nondisplaced fracture of the left L4 transverse process with hardware intact.  Neurosurgery follow-up advised conservative care and patient is maintained in a lumbar corset applied in sitting position.  Noted admission labs unremarkable except sodium 122 BUN 29 creatinine 1.52, WBC 11,500, urinalysis positive nitrite with rare bacteria but no UTI symptoms she was initially placed on empiric antibiotics since discontinued, magnesium 1.4, ammonia level 28.  In regards to patient's hyponatremia felt to be related to poor p.o. intake and HCTZ which was discontinued.  She did receive IV fluid bolus with latest sodium 133 she did have a mild vitamin B12 deficiency.  She initially required midodrine for hypotension which normalized and midodrine has been discontinued.  In regards to patient's acute metabolic encephalopathy felt to be related to polypharmacy dehydration and mild AKI which  has improved.  Increased WBC today, husband reports hospitalist ordered UA, Cr a little higher, husband reports hospitalist recommended increased oral hydration.  Therapy evaluations completed due to patient decreased functional mobility was admitted for a comprehensive rehab program.  Review of Systems  Constitutional:  Positive for fever (low grade). Negative for chills.  HENT:  Negative for hearing loss.   Eyes:  Negative for blurred vision and double vision.  Respiratory:  Negative for cough, shortness of breath and wheezing.   Cardiovascular:  Negative for chest pain, leg swelling and PND.  Gastrointestinal:  Positive for constipation. Negative for heartburn, nausea and vomiting.  Genitourinary:  Negative for dysuria, flank pain and hematuria.  Musculoskeletal:  Positive for back pain, falls and myalgias.  Skin:  Negative for rash.  Neurological:  Positive for weakness and headaches.  Psychiatric/Behavioral:  Positive for depression. The patient is nervous/anxious and has insomnia.        Anxiety as well as panic attacks  All other systems reviewed and are negative.  Past Medical History:  Diagnosis Date   Anemia    Anxiety    Collagenous colitis    Depression    Hyperlipidemia    Hypertension    Hypothyroidism    Lumbar herniated disc    Panic disorder    Past Surgical History:  Procedure Laterality Date   BACK SURGERY     2007   HERNIA REPAIR     TUBAL LIGATION     History reviewed. No pertinent family history. Social History:  reports that she has quit smoking. Her smoking use included cigarettes. She has never used smokeless tobacco. She  reports that she does not currently use alcohol. She reports that she does not use drugs. Allergies:  Allergies  Allergen Reactions   Codeine Other (See Comments)    Syncope knocks me to the floor   Epinephrine Other (See Comments)    Oral seizure-like activity   Azithromycin Rash   Iodine Swelling and Rash   Medications  Prior to Admission  Medication Sig Dispense Refill   acetaminophen  (TYLENOL ) 500 MG tablet Take 500 mg by mouth in the morning, at noon, in the evening, and at bedtime. 9am, 3pm, 9pm, 3am     ALPRAZolam  (XANAX ) 0.5 MG tablet Take 1 tablet (0.5 mg total) by mouth 3 (three) times daily as needed. (Patient taking differently: Take 0.5 mg by mouth 3 (three) times daily.) 90 tablet 2   budesonide  (ENTOCORT EC ) 3 MG 24 hr capsule TAKE 3 CAPSULES BY MOUTH EVERY DAY 270 capsule 1   celecoxib  (CELEBREX ) 200 MG capsule Take 200 mg by mouth daily.     Cholecalciferol (VITAMIN D3 PO) Take 1,000 Units by mouth in the morning and at bedtime.     cyanocobalamin (VITAMIN B12) 1000 MCG tablet Take 1,000 mcg by mouth in the morning.     cyclobenzaprine (FLEXERIL) 5 MG tablet Take 1 tablet (5 mg total) by mouth 3 (three) times daily as needed for muscle spasms. (Patient taking differently: Take 5 mg by mouth 3 (three) times daily. 6am, 3pm, midnight) 30 tablet 0   diphenhydrAMINE HCl (BENADRYL PO) Take 1 tablet by mouth as needed (sleep).     hydrALAZINE  (APRESOLINE ) 50 MG tablet Take 1 tablet (50 mg total) by mouth in the morning and at bedtime. TAKE 1 TABLET 2 TIMES DAILYWITH FOOD 180 tablet 1   hydrochlorothiazide  (HYDRODIURIL ) 25 MG tablet TAKE 1 TABLET DAILY 90 tablet 1   lactose free nutrition (BOOST) LIQD Take 237 mLs by mouth daily.     levothyroxine  (SYNTHROID ) 100 MCG tablet Take 1 tablet by mouth once daily 30 tablet 0   Multiple Vitamin (MULTIVITAMIN WITH MINERALS) TABS tablet Take 1 tablet by mouth in the morning.     nebivolol  (BYSTOLIC ) 10 MG tablet Take 2 tablets (20 mg total) by mouth daily. (Patient taking differently: Take 20 mg by mouth at bedtime.) 180 tablet 1   olmesartan  (BENICAR ) 20 MG tablet Take 1 tablet (20 mg total) by mouth in the morning and at bedtime. 180 tablet 3   oxyCODONE-acetaminophen  (PERCOCET/ROXICET) 5-325 MG tablet Take 1-2 tablets by mouth every 6 (six) hours as needed for  pain (Patient taking differently: Take 1 tablet by mouth every 6 (six) hours as needed for moderate pain (pain score 4-6) or severe pain (pain score 7-10). 6am, noon, 6pm, midnight) 40 tablet 0   rosuvastatin  (CRESTOR ) 40 MG tablet Take 1 tablet (40 mg total) by mouth daily. (Patient taking differently: Take 40 mg by mouth at bedtime.) 30 tablet 3      Home: Home Living Family/patient expects to be discharged to:: Private residence Living Arrangements: Spouse/significant other Available Help at Discharge: Family, Available 24 hours/day Type of Home: House Home Access: Stairs to enter [employer] of Steps: 2 (2 + a large threshold step) Entrance Stairs-Rails: Right, Left, Can reach both Home Layout: Two level, Able to live on main level with bedroom/bathroom Alternate Level Stairs-Number of Steps: flight Bathroom Shower/Tub: Engineer, Manufacturing Systems: Standard Bathroom Accessibility: Yes Home Equipment: Shower seat, Grab bars - tub/shower, Medical Laboratory Scientific Officer - single point Additional Comments: spouse retired Insurance Underwriter  Lives With: Spouse   Functional History: Prior Function Prior Level of Function : Independent/Modified Independent Mobility Comments: youth RW for all mobility since back surgery. pts husband assists with all transfers and mobility. Pt tolerates ~59ft. 4x/day per husband ADLs Comments: pt's husband assists with all aspects of ADLs, sponge bathing only since surgery. [name] husband also reports impaired cognition since surgery  Functional Status:  Mobility: Bed Mobility General bed mobility comments: OOB on arrival standing at sink with spouse Transfers Overall transfer level: Needs assistance Equipment used: Rolling walker (2 wheels) Transfers: Sit to/from Stand Sit to Stand: Contact guard assist General transfer comment: cues needed for hand placement, increased time Ambulation/Gait Ambulation/Gait assistance: Min assist Gait Distance (Feet): 150  Feet Assistive device: Rolling walker (2 wheels) Gait Pattern/deviations: Decreased stride length, Step-to pattern, Decreased step length - right, Decreased step length - left, Decreased dorsiflexion - right, Decreased dorsiflexion - left, Shuffle, Wide base of support General Gait Details: Pt with a very wide BOS and small steps with poor dorsiflexion resulting in minimal foot clearance, worse with onset of fatigue. multiple standing rest breaks, max directional cues during turning as pt unable to sequence or complete directional task without max directional cues. Pt was able to read room numbers on the R hand side but had to stop in front of every room and could not complete a dual task Gait velocity: reduced Gait velocity interpretation: <1.31 ft/sec, indicative of household ambulator    ADL: ADL Overall ADL's : Needs assistance/impaired Eating/Feeding: Minimal assistance, Sitting Grooming: Minimal assistance, Sitting Upper Body Bathing: Minimal assistance, Sitting Lower Body Bathing: Moderate assistance, Sitting/lateral leans, Sit to/from stand Lower Body Bathing Details (indicate cue type and reason): assist for peri area and feet Upper Body Dressing : Set up, Sitting Lower Body Dressing: Total assistance Lower Body Dressing Details (indicate cue type and reason): total A for socks Toilet Transfer: Contact guard assist, Ambulation Toilet Transfer Details (indicate cue type and reason): functional ambulation WFL with RW, cues for hand placement Toileting- Clothing Manipulation and Hygiene: Total assistance, Sit to/from stand Functional mobility during ADLs: Contact guard assist, Rolling walker (2 wheels) General ADL Comments: continues to needs assist for LB tasks and cognition. Pt took a full shower with assist, she did dequence through most ADLs WFL but needed cues for safety  Cognition: Cognition Orientation Level: Oriented X4 Cognition Arousal: Alert Behavior During Therapy:  Flat affect  Physical Exam: Blood pressure 111/68, pulse 91, temperature 98.2 F (36.8 C), temperature source Oral, resp. rate 18, height 4' 11 (1.499 m), weight 90.5 kg, SpO2 94%.  General: No apparent distress HEENT: Head is normocephalic, atraumatic, sclera anicteric, oral mucosa dry, dentition intact, wearing glasses Neck: Supple without JVD or lymphadenopathy Heart: Reg rate and rhythm. No murmurs rubs or gallops Chest: CTA bilaterally without wheezes, rales, or rhonchi; no distress Abdomen: Soft, non-tender, non-distended, bowel sounds positive. Extremities: No clubbing, cyanosis, or edema. Pulses are 2+ Psych: [name] affect is appropriate. Pt is cooperative. pleasant Skin: small amount of drainage noted from wound- please see images, seen by Dr. Dalene today per husband Neuro:    Mental Status: AAOx3, responses a little delayed Speech/Languate: Naming and repetition intact, fluent, follows simple commands CRANIAL NERVES: II: PERRL. Visual fields full III, IV, VI: EOM intact, no gaze preference or deviation V: normal sensation bilaterally VII: no asymmetry VIII: normal hearing to speech IX, X: normal palatal elevation XI: head turn intact  XII: Tongue midline   MOTOR: RUE: 4-/5 Deltoid, 4/5 Biceps,  4/5 Triceps,4/5 Grip LUE: 4-/5 Deltoid, 4/5 Biceps, 4/5 Triceps, 4/5 Grip RLE: HF 3/5, KE 4/5, ADF 4/5, APF 4/5 LLE: HF 3/5, KE 4/5, ADF 4/5, APF 4/5   REFLEXES:no hyperreflexia noted, no ankle clonus  SENSORY: Normal to touch all 4 extremities  Coordination: Normal finger to nose b/l   MSK: no joint swelling    Results for orders placed or performed during the hospital encounter of 05/08/24 (from the past 48 hours)  CBC     Status: Abnormal   Collection Time: 05/14/24  4:37 AM  Result Value Ref Range   WBC 21.1 (H) 4.0 - 10.5 K/uL   RBC 3.69 (L) 3.87 - 5.11 MIL/uL   Hemoglobin 11.8 (L) 12.0 - 15.0 g/dL   HCT 64.6 (L) 63.9 - 53.9 %   MCV 95.7 80.0 - 100.0 fL    MCH 32.0 26.0 - 34.0 pg   MCHC 33.4 30.0 - 36.0 g/dL   RDW 86.9 88.4 - 84.4 %   Platelets 271 150 - 400 K/uL   nRBC 0.0 0.0 - 0.2 %    Comment: Performed at [facility], [address], [place], [place] [address]  Basic metabolic panel     Status: Abnormal   Collection Time: 05/14/24  4:37 AM  Result Value Ref Range   Sodium 137 135 - 145 mmol/L   Potassium 4.1 3.5 - 5.1 mmol/L   Chloride 98 98 - 111 mmol/L   CO2 27 22 - 32 mmol/L   Glucose, Bld 122 (H) 70 - 99 mg/dL    Comment: Glucose reference range applies only to samples taken after fasting for at least 8 hours.   BUN 15 8 - 23 mg/dL   Creatinine, Ser 8.87 (H) 0.44 - 1.00 mg/dL   Calcium  9.3 8.9 - 10.3 mg/dL   GFR, Estimated 55 (L) >60 mL/min    Comment: (NOTE) Calculated using the CKD-EPI Creatinine Equation (2021)    Anion gap 12 5 - 15    Comment: Performed at [facility], [address] [place], [place] [address]   No results found.    Blood pressure 111/68, pulse 91, temperature 98.2 F (36.8 C), temperature source Oral, resp. rate 18, height 4' 11 (1.499 m), weight 90.5 kg, SpO2 94%.  Medical Problem List and Plan: 1. Functional deficits secondary to history of L4-L5 PLIF 04/23/2024 by Dr. Mavis complicated by dehydration/acute metabolic encephalopathy  -patient may shower-cover incision please   -ELOS/Goals: 7-10 days, sup PT, Sup to min A OT  -Admit to CIR 2.  Antithrombotics: -DVT/anticoagulation:  Mechanical: Antiembolism stockings, thigh (TED hose) Bilateral lower extremities  -antiplatelet therapy: N/A 3. Pain Management: Celebrex  200 mg daily, oxycodone 5 mg every 8 hours as needed moderate pain 4. Mood/Behavior/Sleep/anxiety/panic attacks.  Xanax  0.25 mg nightly as needed  -antipsychotic agents: N/A 5. Neuropsych/cognition: This patient is capable of making decisions on her own behalf. 6. Skin/Wound Care: Routine skin check 7. Fluids/Electrolytes/Nutrition: Routine in and outs with  follow-up chemistries 8.  AKI/hyponatremia/hypotension.  Patient initially with ProAmatine since discontinued.  Prior to admission patient on HCTZ 25 mg daily, hydralazine  50 mg twice daily, Bystolic  20 mg daily, Benicar  20 mg twice daily.  Monitor with increased mobility and resume as needed  -11/19 Cr/BUN was a little higher this AM, encourage oral fluids, recheck tomorrow  9.  Hyperlipidemia.  Crestor  10.  Hypothyroidism.  Synthroid  11.  B12 deficiency.  Follow-up outpatient 12.  13 x 6 mm ground glass nodule  right lung apex.  Follow-up outpatient 6 to 12 months chest CT. 13.  Class III obesity.  BMI 40.30.  Dietary follow-up 14.  History of alcohol and tobacco use.  Provide counseling 15. Leukocytosis  -Check CXR, UA, recheck tomorrow     Toribio JINNY Pitch, PA-C 05/14/2024  I have personally performed a face to face diagnostic evaluation of this patient and formulated the key components of the plan.  Additionally, I have personally reviewed laboratory data, imaging studies, as well as relevant notes and concur with the physician assistant's documentation above.  The patient's status has not changed from the original H&P.  Any changes in documentation from the acute care chart have been noted above.  Murray Collier, MD

## 2024-05-14 NOTE — Progress Notes (Signed)
 Inpatient Rehabilitation Admission Medication Review by a Pharmacist  A complete drug regimen review was completed for this patient to identify any potential clinically significant medication issues.  High Risk Drug Classes Is patient taking? Indication by Medication  Antipsychotic No   Anticoagulant No   Antibiotic No   Opioid Yes Oxycodone  - pain  Antiplatelet No   Hypoglycemics/insulin No   Vasoactive Medication No   Chemotherapy No   Other Yes Tylenol /celebrex  - pain Xanax  - anxiety Entocort - colitis Flonase  - allergy Synthroid  - hypothyroidism MVI/thiamine  - supplement Crestor  - HLD     Type of Medication Issue Identified Description of Issue Recommendation(s)  Drug Interaction(s) (clinically significant)     Duplicate Therapy     Allergy     No Medication Administration End Date     Incorrect Dose     Additional Drug Therapy Needed     Significant med changes from prior encounter (inform family/care partners about these prior to discharge).    Other  HCTZ 25 mg daily, hydralazine  50 mg twice daily, Bystolic  20 mg daily, Benicar  20 mg twice daily  Resume as needed in CIR or at discharge    Clinically significant medication issues were identified that warrant physician communication and completion of prescribed/recommended actions by midnight of the next day:  No  Name of provider notified for urgent issues identified:   Provider Method of Notification:     Pharmacist comments:   Time spent performing this drug regimen review (minutes):  20   Rocky Slade, PharmD, BCPS 05/14/2024 3:37 PM

## 2024-05-14 NOTE — Discharge Instructions (Addendum)
 Inpatient Rehab Discharge Instructions  [facility] Discharge date and time: No discharge date for patient encounter.   Activities/Precautions/ Functional Status: Activity: Lumbar corset when out of bed applied in sitting position Diet: Regular Wound Care: Routine skin checks Functional status:  ___ No restrictions     ___ Walk up steps independently ___ 24/7 supervision/assistance   ___ Walk up steps with assistance ___ Intermittent supervision/assistance  ___ Bathe/dress independently ___ Walk with walker     __x_ Bathe/dress with assistance ___ Walk Independently    ___ Shower independently ___ Walk with assistance    ___ Shower with assistance ___ No alcohol     ___ Return to work/school ________  Special Instructions: No driving smoking or alcohol  COMMUNITY REFERRALS UPON DISCHARGE:    Outpatient: PT    Agency: [facility]    Phone: [phone]              Appointment Date/Time: *Please expect follow-up within 7-10 business days to schedule your appointment. If you have not received follow-up, be sure to contact the site directly.*  My questions have been answered and I understand these instructions. I will adhere to these goals and the provided educational materials after my discharge from the hospital.  Patient/Caregiver Signature _______________________________ Date __________  Clinician Signature _______________________________________ Date __________  Please bring this form and your medication list with you to all your follow-up doctor's appointments.

## 2024-05-15 ENCOUNTER — Inpatient Hospital Stay (HOSPITAL_COMMUNITY)

## 2024-05-15 DIAGNOSIS — E871 Hypo-osmolality and hyponatremia: Secondary | ICD-10-CM

## 2024-05-15 DIAGNOSIS — Z981 Arthrodesis status: Secondary | ICD-10-CM

## 2024-05-15 DIAGNOSIS — D72829 Elevated white blood cell count, unspecified: Secondary | ICD-10-CM | POA: Diagnosis not present

## 2024-05-15 DIAGNOSIS — K52831 Collagenous colitis: Secondary | ICD-10-CM | POA: Diagnosis not present

## 2024-05-15 DIAGNOSIS — S32009D Unspecified fracture of unspecified lumbar vertebra, subsequent encounter for fracture with routine healing: Secondary | ICD-10-CM

## 2024-05-15 DIAGNOSIS — N179 Acute kidney failure, unspecified: Secondary | ICD-10-CM | POA: Diagnosis not present

## 2024-05-15 LAB — CBC WITH DIFFERENTIAL/PLATELET
Abs Immature Granulocytes: 0.13 K/uL — ABNORMAL HIGH (ref 0.00–0.07)
Basophils Absolute: 0.1 K/uL (ref 0.0–0.1)
Basophils Relative: 0 %
Eosinophils Absolute: 0.1 K/uL (ref 0.0–0.5)
Eosinophils Relative: 0 %
HCT: 30.9 % — ABNORMAL LOW (ref 36.0–46.0)
Hemoglobin: 10.2 g/dL — ABNORMAL LOW (ref 12.0–15.0)
Immature Granulocytes: 1 %
Lymphocytes Relative: 11 %
Lymphs Abs: 2.1 K/uL (ref 0.7–4.0)
MCH: 31.6 pg (ref 26.0–34.0)
MCHC: 33 g/dL (ref 30.0–36.0)
MCV: 95.7 fL (ref 80.0–100.0)
Monocytes Absolute: 1.4 K/uL — ABNORMAL HIGH (ref 0.1–1.0)
Monocytes Relative: 8 %
Neutro Abs: 14.9 K/uL — ABNORMAL HIGH (ref 1.7–7.7)
Neutrophils Relative %: 80 %
Platelets: 242 K/uL (ref 150–400)
RBC: 3.23 MIL/uL — ABNORMAL LOW (ref 3.87–5.11)
RDW: 13.2 % (ref 11.5–15.5)
WBC: 18.7 K/uL — ABNORMAL HIGH (ref 4.0–10.5)
nRBC: 0 % (ref 0.0–0.2)

## 2024-05-15 LAB — COMPREHENSIVE METABOLIC PANEL WITH GFR
ALT: 22 U/L (ref 0–44)
AST: 21 U/L (ref 15–41)
Albumin: 2.3 g/dL — ABNORMAL LOW (ref 3.5–5.0)
Alkaline Phosphatase: 81 U/L (ref 38–126)
Anion gap: 13 (ref 5–15)
BUN: 15 mg/dL (ref 8–23)
CO2: 23 mmol/L (ref 22–32)
Calcium: 8.8 mg/dL — ABNORMAL LOW (ref 8.9–10.3)
Chloride: 96 mmol/L — ABNORMAL LOW (ref 98–111)
Creatinine, Ser: 0.88 mg/dL (ref 0.44–1.00)
GFR, Estimated: >60 mL/min (ref >60)
Glucose, Bld: 99 mg/dL (ref 70–99)
Potassium: 3.5 mmol/L (ref 3.5–5.1)
Sodium: 132 mmol/L — ABNORMAL LOW (ref 135–145)
Total Bilirubin: 0.8 mg/dL (ref 0.0–1.2)
Total Protein: 5.4 g/dL — ABNORMAL LOW (ref 6.5–8.1)

## 2024-05-15 LAB — URINALYSIS, W/ REFLEX TO CULTURE (INFECTION SUSPECTED)
Bilirubin Urine: NEGATIVE
Glucose, UA: NEGATIVE mg/dL
Ketones, ur: NEGATIVE mg/dL
Nitrite: NEGATIVE
Protein, ur: 30 mg/dL — AB
Specific Gravity, Urine: 1.004 — ABNORMAL LOW (ref 1.005–1.030)
pH: 6 (ref 5.0–8.0)

## 2024-05-15 MED ORDER — CEPHALEXIN 250 MG PO CAPS: 250.0000 mg | ORAL_CAPSULE | Freq: Four times a day (QID) | ORAL | Status: DC

## 2024-05-15 MED ORDER — DOCUSATE SODIUM 100 MG PO CAPS
200.0000 mg | ORAL_CAPSULE | Freq: Every day | ORAL | Status: DC
  Administered 2024-05-15 – 2024-05-21 (×7): 200 mg via ORAL
  Filled 2024-05-15 (×8): qty 2

## 2024-05-15 MED ORDER — CEFADROXIL 500 MG PO CAPS
500.0000 mg | ORAL_CAPSULE | Freq: Two times a day (BID) | ORAL | Status: DC
  Administered 2024-05-15 – 2024-05-20 (×10): 500 mg via ORAL
  Filled 2024-05-15 (×10): qty 1

## 2024-05-15 MED ORDER — BOOST / RESOURCE BREEZE PO LIQD CUSTOM
1.0000 | Freq: Three times a day (TID) | ORAL | Status: DC
  Administered 2024-05-15: 237 mL via ORAL
  Administered 2024-05-15 – 2024-05-16 (×3): 1 via ORAL

## 2024-05-15 NOTE — Evaluation (Signed)
 Occupational Therapy Assessment and Plan  Patient Details  Name: Jenny Yates MRN: [id number] Date of Birth: 1961/05/24  OT Diagnosis: abnormal posture, acute pain, lumbago (low back pain), and muscle weakness (generalized) Rehab Potential: Rehab Potential (ACUTE ONLY): Good ELOS: 7-10 days   Today's Date: 05/15/2024 OT Individual Time: 8947-8781 OT Individual Time Calculation (min): 86 min     Hospital Problem: Principal Problem:   Lumbar transverse process fracture (HCC)   Past Medical History:  Past Medical History:  Diagnosis Date   Anemia    Anxiety    Collagenous colitis    Depression    Hyperlipidemia    Hypertension    Hypothyroidism    Lumbar herniated disc    Panic disorder    Past Surgical History:  Past Surgical History:  Procedure Laterality Date   BACK SURGERY     2007   HERNIA REPAIR     TUBAL LIGATION      Assessment & Plan Clinical Impression: Jenny Yates is a 63 year old right-handed female with history significant for hypertension, anxiety/depression as well as panic disorders, hypothyroidism, class III obesity BMI 40.30, hyperlipidemia, tobacco/alcohol use and chronic back pain status post L4-5 fusion 04/23/2024 per Dr. Mavis who presented 05/08/2024 after mechanical fall at home with some noted altered mental status as well as noted to be hypotensive. Per chart review patient lives with spouse. Two-level home bed and bath main level 2 steps to entry. She had been using a rolling walker for mobility since recent back surgery. Cranial CT scan showed no acute intracranial abnormality as well as follow-up MRI of the brain showing moderate generalized atrophy no acute findings.. CT of the chest and abdomen showed a 13 x 6 mm ground glass nodule in the right lung apex recommending CT chest 6 to 12 months. CT lumbar spine showed possible nondisplaced fracture of the left L4 transverse process with hardware intact. Neurosurgery follow-up advised  conservative care and patient is maintained in a lumbar corset applied in sitting position. Noted admission labs unremarkable except sodium 122 BUN 29 creatinine 1.52, WBC 11,500, urinalysis positive nitrite with rare bacteria but no UTI symptoms she was initially placed on empiric antibiotics since discontinued, magnesium 1.4, ammonia level 28. In regards to patient's hyponatremia felt to be related to poor p.o. intake and HCTZ which was discontinued. She did receive IV fluid bolus with latest sodium 133 she did have a mild vitamin B12 deficiency. She initially required midodrine for hypotension which normalized and midodrine has been discontinued. In regards to patient's acute metabolic encephalopathy felt to be related to polypharmacy dehydration and mild AKI which has improved. Increased WBC today, husband reports hospitalist ordered UA, Cr a little higher, husband reports hospitalist recommended increased oral hydration. Therapy evaluations completed due to patient decreased functional mobility was admitted for a comprehensive rehab program. .  Patient transferred to CIR on 05/14/2024 .    Patient currently requires mod with basic self-care skills secondary to muscle weakness, decreased cardiorespiratoy endurance, decreased memory, and decreased standing balance, decreased postural control, and decreased balance strategies.  Prior to hospitalization, patient could complete ADL with min.  Patient will benefit from skilled intervention to decrease level of assist with basic self-care skills and increase independence with basic self-care skills prior to discharge home with care partner.  Anticipate patient will require 24 hour supervision and follow up outpatient.  OT - End of Session Activity Tolerance: Improving Endurance Deficit: Yes OT Assessment Rehab Potential (ACUTE ONLY): Good OT Barriers to  Discharge: None OT Patient demonstrates impairments in the following area(s):  Balance;Safety;Sensory;Skin Integrity;Endurance;Motor;Pain OT Basic ADL's Functional Problem(s): Grooming;Bathing;Dressing;Toileting OT Advanced ADL's Functional Problem(s): None OT Transfers Functional Problem(s): Toilet;Tub/Shower OT Additional Impairment(s): None OT Plan OT Intensity: Minimum of 1-2 x/day, 45 to 90 minutes OT Frequency: 5 out of 7 days OT Duration/Estimated Length of Stay: 7-10 days OT Treatment/Interventions: Balance/vestibular training;Discharge planning;Functional electrical stimulation;Pain management;Self Care/advanced ADL retraining;Therapeutic Activities;UE/LE Coordination activities;Cognitive remediation/compensation;Functional mobility training;Disease mangement/prevention;Patient/family education;Skin care/wound managment;Therapeutic Exercise;Community reintegration;DME/adaptive equipment instruction;Neuromuscular re-education;Splinting/orthotics;Psychosocial support;UE/LE Strength taining/ROM;Wheelchair propulsion/positioning OT Self Feeding Anticipated Outcome(s): mod I OT Basic Self-Care Anticipated Outcome(s): SBA OT Toileting Anticipated Outcome(s): SBA OT Bathroom Transfers Anticipated Outcome(s): SBA OT Recommendation Recommendations for Other Services: None Patient destination: Home Follow Up Recommendations: Outpatient OT Equipment Recommended: None recommended by OT Equipment Details: owns all OT equipment   OT Evaluation Precautions/Restrictions  Precautions Precautions: Fall;Back Recall of Precautions/Restrictions: Intact Precaution/Restrictions Comments: able to recall 3/3 precautions Required Braces or Orthoses: Spinal Brace Spinal Brace: Lumbar corset;Applied in sitting position Restrictions Weight Bearing Restrictions Per Provider Order: No General Chart Reviewed: Yes Family/Caregiver Present: Yes (husband [name]) Vital Signs   Pain Pain Assessment Pain Scale: 0-10 Pain Score: 1  Pain Type: Surgical pain Pain Location: Back Pain  Intervention(s): Ambulation/increased activity;Shower;Rest;Hot/Cold interventions;Relaxation;Repositioned;Emotional support Multiple Pain Sites: No Home Living/Prior Functioning Home Living Family/patient expects to be discharged to:: Private residence Living Arrangements: Spouse/significant other Available Help at Discharge: Family, Available 24 hours/day Type of Home: House Home Access: Stairs to enter [employer] of Steps: 2 (2 + a large threshold step) Entrance Stairs-Rails: Right, Left, Can reach both Home Layout: Two level, Able to live on main level with bedroom/bathroom Alternate Level Stairs-Number of Steps: Rarely went upstairs Bathroom Shower/Tub: Armed Forces Operational Officer Accessibility: Yes Additional Comments: spouse retired Insurance Underwriter  Lives With: Spouse IADL History Homemaking Responsibilities: Yes Meal Prep Responsibility: Secondary Laundry Responsibility: Secondary Cleaning Responsibility: Secondary Child Care Responsibility: No Homemaking Comments: 2 cats Current License: Yes Occupation: Retired Advertising Account Planner: travel Prior Function Level of Independence: Requires assistive device for independence (had been using a cane since april and since recent back surgery spouse assisting with ADLs)  Able to Take Stairs?: Yes Driving: Yes Vocation: Retired Leisure: Hobbies-yes (Comment) Vision Baseline Vision/History: 1 Wears glasses Ability to See in Adequate Light: 0 Adequate Patient Visual Report: No change from baseline Vision Assessment?: No apparent visual deficits Perception  Perception: Within Functional Limits Praxis Praxis: WFL Cognition Cognition Overall Cognitive Status: Impaired/Different from baseline (very mild deficits) Arousal/Alertness: Awake/alert Orientation Level: Person;Place;Situation Person: Oriented Place: Oriented Situation: Oriented Memory: Appears intact Awareness: Appears intact Problem  Solving: Appears intact Safety/Judgment: Appears intact Brief Interview for Mental Status (BIMS) Repetition of Three Words (First Attempt): 3 Temporal Orientation: Year: Correct Temporal Orientation: Month: Accurate within 5 days Temporal Orientation: Day: Correct Recall: Sock: Yes, no cue required Recall: Blue: Yes, no cue required Recall: Bed: Yes, no cue required BIMS Summary Score: 15 Sensation Sensation Light Touch: Impaired by gross assessment Hot/Cold: Appears Intact Proprioception: Appears Intact Stereognosis: Not tested Additional Comments: below knee to feet, pt reporting occasional tingling but not constant Coordination Gross Motor Movements are Fluid and Coordinated: No Fine Motor Movements are Fluid and Coordinated: Yes Coordination and Movement Description: general weakness and debility Motor  Motor Motor: Abnormal postural alignment and control Motor - Skilled Clinical Observations: generalized weakness and muscle fatigue  Trunk/Postural Assessment  Cervical Assessment Cervical Assessment: Within Functional Limits Thoracic Assessment Thoracic Assessment: Exceptions to [facility] (  rounded shoulders) Lumbar Assessment Lumbar Assessment: Exceptions to [facility] (posterior pelvic tilt) Postural Control Postural Control: Deficits on evaluation Righting Reactions: delayed Postural Limitations: inadequate  Balance Balance Balance Assessed: Yes Static Sitting Balance Static Sitting - Balance Support: Feet supported Static Sitting - Level of Assistance: 5: Stand by assistance Dynamic Sitting Balance Dynamic Sitting - Balance Support: Feet supported Dynamic Sitting - Level of Assistance: 5: Stand by assistance Static Standing Balance Static Standing - Balance Support: Bilateral upper extremity supported;During functional activity Static Standing - Level of Assistance: 4: Min assist (CGA) Dynamic Standing Balance Dynamic Standing - Balance Support: During functional  activity Dynamic Standing - Level of Assistance: 4: Min assist (CGA) Extremity/Trunk Assessment RUE Assessment RUE Assessment: Exceptions to [facility] Active Range of Motion (AROM) Comments: WFL General Strength Comments: 4-/5, general weakness LUE Assessment LUE Assessment: Exceptions to [facility] Active Range of Motion (AROM) Comments: WFL General Strength Comments: 4-/5, general weakness  Care Tool Care Tool Self Care Eating   Eating Assist Level: Supervision/Verbal cueing    Oral Care    Oral Care Assist Level: Supervision/Verbal cueing    Bathing         Assist Level: Minimal Assistance - Patient > 75%    Upper Body Dressing(including orthotics)       Assist Level: Contact Guard/Touching assist    Lower Body Dressing (excluding footwear)     Assist for lower body dressing: Moderate Assistance - Patient 50 - 74%    Putting on/Taking off footwear     Assist for footwear: Dependent - Patient 0%       Care Tool Toileting Toileting activity   Assist for toileting: Minimal Assistance - Patient > 75%     Care Tool Bed Mobility Roll left and right activity   Roll left and right assist level: Minimal Assistance - Patient > 75%    Sit to lying activity   Sit to lying assist level: Minimal Assistance - Patient > 75%    Lying to sitting on side of bed activity   Lying to sitting on side of bed assist level: the ability to move from lying on the back to sitting on the side of the bed with no back support.: Minimal Assistance - Patient > 75%     Care Tool Transfers Sit to stand transfer   Sit to stand assist level: Contact Guard/Touching assist    Chair/bed transfer   Chair/bed transfer assist level: Contact Guard/Touching assist     Toilet transfer   Assist Level: Contact Guard/Touching assist     Care Tool Cognition  Expression of Ideas and Wants Expression of Ideas and Wants: 4. Without difficulty (complex and basic) - expresses complex messages without difficulty  and with speech that is clear and easy to understand  Understanding Verbal and Non-Verbal Content Understanding Verbal and Non-Verbal Content: 4. Understands (complex and basic) - clear comprehension without cues or repetitions   Memory/Recall Ability Memory/Recall Ability : That he or she is in a hospital/hospital unit;Staff names and faces;Current season   Refer to Care Plan for Long Term Goals  SHORT TERM GOAL WEEK 1 OT Short Term Goal 1 (Week 1): STG=LTG d/t ELOS  Recommendations for other services: Therapeutic Recreation  Pet therapy   Skilled Therapeutic Intervention ADL ADL Equipment Provided: Long-handled sponge Eating: Set up Where Assessed-Eating: Bed level Grooming: Setup Where Assessed-Grooming: Sitting at sink Upper Body Bathing: Supervision/safety Where Assessed-Upper Body Bathing: Shower Lower Body Bathing: Minimal assistance Where Assessed-Lower Body Bathing: Water Quality Scientist  Dressing: Supervision/safety Where Assessed-Upper Body Dressing: Edge of bed Lower Body Dressing: Moderate assistance Where Assessed-Lower Body Dressing: Edge of bed Toileting: Minimal assistance Where Assessed-Toileting: Teacher, Adult Education: Furniture Conservator/restorer Method: Proofreader: Engineer, Technical Sales: Not assessed Film/video Editor: Administrator, Arts Method: Designer, Industrial/product: Event Organiser  Bed Mobility Bed Mobility: Rolling Right;Rolling Left;Supine to Sit;Sit to Supine Rolling Right: Minimal Assistance - Patient > 75% Rolling Left: Minimal Assistance - Patient > 75% Supine to Sit: Moderate Assistance - Patient 50-74% Sit to Supine: Minimal Assistance - Patient > 75% Transfers Sit to Stand: Contact Guard/Touching assist Stand to Sit: Contact Guard/Touching assist  1:1 evaluation and treatment session initiated this date. OT roles, goals and purpose discussed with pt as  well as therapy schedule. ADL completed this date with levels of assist listed above. OT checking pt's spouse off for transfers with good safety awareness. Pt would benefit from skilled OT in IPR setting in order to maximize independence with ADLs upon D/C.   Discharge Criteria: Patient will be discharged from OT if patient refuses treatment 3 consecutive times without medical reason, if treatment goals not met, if there is a change in medical status, if patient makes no progress towards goals or if patient is discharged from hospital.  The above assessment, treatment plan, treatment alternatives and goals were discussed and mutually agreed upon: by patient and by family  Camie Hoe, OTD, OTR/L 05/15/2024, 12:48 PM

## 2024-05-15 NOTE — Plan of Care (Signed)
  Problem: RH Grooming Goal: LTG Patient will perform grooming w/assist,cues/equip (OT) Description: LTG: Patient will perform grooming with assist, with/without cues using equipment (OT) Flowsheets (Taken 05/15/2024 1222) LTG: Pt will perform grooming with assistance level of: Independent with assistive device    Problem: RH Bathing Goal: LTG Patient will bathe all body parts with assist levels (OT) Description: LTG: Patient will bathe all body parts with assist levels (OT) Flowsheets (Taken 05/15/2024 1222) LTG: Pt will perform bathing with assistance level/cueing: Supervision/Verbal cueing   Problem: RH Dressing Goal: LTG Patient will perform upper body dressing (OT) Description: LTG Patient will perform upper body dressing with assist, with/without cues (OT). Flowsheets (Taken 05/15/2024 1222) LTG: Pt will perform upper body dressing with assistance level of: Set up assist Goal: LTG Patient will perform lower body dressing w/assist (OT) Description: LTG: Patient will perform lower body dressing with assist, with/without cues in positioning using equipment (OT) Flowsheets (Taken 05/15/2024 1222) LTG: Pt will perform lower body dressing with assistance level of: Supervision/Verbal cueing   Problem: RH Toileting Goal: LTG Patient will perform toileting task (3/3 steps) with assistance level (OT) Description: LTG: Patient will perform toileting task (3/3 steps) with assistance level (OT)  Flowsheets (Taken 05/15/2024 1222) LTG: Pt will perform toileting task (3/3 steps) with assistance level: Supervision/Verbal cueing   Problem: RH Toilet Transfers Goal: LTG Patient will perform toilet transfers w/assist (OT) Description: LTG: Patient will perform toilet transfers with assist, with/without cues using equipment (OT) Flowsheets (Taken 05/15/2024 1222) LTG: Pt will perform toilet transfers with assistance level of: Supervision/Verbal cueing   Problem: RH Tub/Shower Transfers Goal: LTG  Patient will perform tub/shower transfers w/assist (OT) Description: LTG: Patient will perform tub/shower transfers with assist, with/without cues using equipment (OT) Flowsheets (Taken 05/15/2024 1222) LTG: Pt will perform tub/shower stall transfers with assistance level of: Supervision/Verbal cueing

## 2024-05-15 NOTE — Progress Notes (Addendum)
 PROGRESS NOTE   Subjective/Complaints: Feels a little constipated, LBM 11/17 documented.  Reports slept well last night.  ROS: No fever, chills, chest pain, new motor or sensory changes, chronic anxiety, nausea, vomiting Son chronic shortness of breath at baseline + Constipation  Objective:   DG Chest 2 View Result Date: 05/14/2024 EXAM: 2 VIEW(S) XRAY OF THE CHEST 05/14/2024 05:58:00 PM COMPARISON: CT chest 05/25/2024, chest x-ray 05/08/2024. CLINICAL HISTORY: Leukocytosis. FINDINGS: LUNGS AND PLEURA: Low lung volumes. Perihilar interstitial thickening. No focal pulmonary opacity. No pleural effusion. No pneumothorax. HEART AND MEDIASTINUM: Unchanged cardiomediastinal silhouette. BONES AND SOFT TISSUES: No acute osseous abnormality. IMPRESSION: 1. Low lung volumes with perihilar interstitial thickening. Electronically signed by: Kate Plummer MD 05/14/2024 11:28 PM EST RP Workstation: [id number]   Recent Labs    05/14/24 0437 05/15/24 0442  WBC 21.1* 18.7*  HGB 11.8* 10.2*  HCT 35.3* 30.9*  PLT 271 242   Recent Labs    05/14/24 0437 05/15/24 0442  NA 137 132*  K 4.1 3.5  CL 98 96*  CO2 27 23  GLUCOSE 122* 99  BUN 15 15  CREATININE 1.12* 0.88  CALCIUM  9.3 8.8*    Intake/Output Summary (Last 24 hours) at 05/15/2024 1433 Last data filed at 05/15/2024 0805 Gross per 24 hour  Intake 360 ml  Output 700 ml  Net -340 ml        Physical Exam: Vital Signs Blood pressure (!) 104/59, pulse 80, temperature 97.9 F (36.6 C), temperature source Oral, resp. rate 18, height 4' 11 (1.499 m), weight 90.7 kg, SpO2 95%.  General: No apparent distress HEENT: Head is normocephalic, atraumatic, sclera anicteric, oral mucosa dry,  wearing glasses Heart: Reg rate and rhythm. No murmurs rubs or gallops Chest: CTA bilaterally without wheezes, rales, or rhonchi; no distress Abdomen: Soft, non-tender, non-distended, bowel sounds  positive. Extremities: No clubbing, cyanosis, or edema. Pulses are 2+ Psych: [name] affect is a little flat, cooperative  skin: Incision looks better than yesterday, less redness today and no significant drainage noted today   neuro: Alert and awake, responses little delayed, fluent, follows commands, cranial nerves II through XII grossly intact,      MOTOR: RUE: 4-/5 Deltoid, 4/5 Biceps, 4/5 Triceps,4/5 Grip LUE: 4-/5 Deltoid, 4/5 Biceps, 4/5 Triceps, 4/5 Grip RLE: HF 3/5, KE 4/5, ADF 4/5, APF 4/5 LLE: HF 3/5, KE 4/5, ADF 4/5, APF 4/5    SENSORY: Normal to touch all 4 extremities   MSK: no joint swelling  Prior neuro assessment is c/w today's exam 05/15/2024.  Image from 11/19      Assessment/Plan: 1. Functional deficits which require 3+ hours per day of interdisciplinary therapy in a comprehensive inpatient rehab setting. Physiatrist is providing close team supervision and 24 hour management of active medical problems listed below. Physiatrist and rehab team continue to assess barriers to discharge/monitor patient progress toward functional and medical goals  Care Tool:  Bathing              Bathing assist Assist Level: Minimal Assistance - Patient > 75%     Upper Body Dressing/Undressing Upper body dressing        Upper body assist Assist Level:  Contact Guard/Touching assist    Lower Body Dressing/Undressing Lower body dressing            Lower body assist Assist for lower body dressing: Moderate Assistance - Patient 50 - 74%     Toileting Toileting    Toileting assist Assist for toileting: Minimal Assistance - Patient > 75%     Transfers Chair/bed transfer  Transfers assist     Chair/bed transfer assist level: Contact Guard/Touching assist     Locomotion Ambulation   Ambulation assist      Assist level: Contact Guard/Touching assist Assistive device: Walker-rolling Max distance: 150   Walk 10 feet activity   Assist      Assist level: Contact Guard/Touching assist Assistive device: Walker-rolling   Walk 50 feet activity   Assist    Assist level: Contact Guard/Touching assist Assistive device: Walker-rolling    Walk 150 feet activity   Assist    Assist level: Contact Guard/Touching assist Assistive device: Walker-rolling    Walk 10 feet on uneven surface  activity   Assist           Wheelchair     Assist Is the patient using a wheelchair?: Yes Type of Wheelchair: Manual    Wheelchair assist level: Moderate Assistance - Patient 50 - 74% Max wheelchair distance: 5 ft    Wheelchair 50 feet with 2 turns activity    Assist    Wheelchair 50 feet with 2 turns activity did not occur: Safety/medical concerns (weakness/fatigue/pain)       Wheelchair 150 feet activity     Assist  Wheelchair 150 feet activity did not occur: Safety/medical concerns (weakness/fatigue/pain)       Blood pressure (!) 104/59, pulse 80, temperature 97.9 F (36.6 C), temperature source Oral, resp. rate 18, height 4' 11 (1.499 m), weight 90.7 kg, SpO2 95%.  Medical Problem List and Plan: 1. Functional deficits secondary to history of L4-L5 PLIF 04/23/2024 by Dr. Mavis due to spinal stenosis, lumbar radiculopathy and  neurogenic claudication complicated by dehydration/acute metabolic encephalopathy. She had likely nondisplaced fracture of the left L4 transverse process after a fall              -patient may shower-cover incision please              -ELOS/Goals: 7-10 days, sup PT, Sup to min A OT             - Continue CIR 2.  Antithrombotics: -DVT/anticoagulation:  Mechanical: Antiembolism stockings, thigh (TED hose) Bilateral lower extremities             -antiplatelet therapy: N/A 3. Pain Management: Celebrex  200 mg daily, oxycodone  5 mg every 8 hours as needed moderate pain 4. Mood/Behavior/Sleep/anxiety/panic attacks.  Xanax  0.25 mg nightly as needed             -antipsychotic  agents: N/A 5. Neuropsych/cognition: This patient is capable of making decisions on her own behalf. 6. Skin/Wound Care: Routine skin check 7. Fluids/Electrolytes/Nutrition: Routine in and outs with follow-up chemistries  - 11/20 check prealbumin tomorrow, continue protein supplements 8.  AKI/hyponatremia/hypotension.  Patient initially with ProAmatine  since discontinued.  Prior to admission patient on HCTZ 25 mg daily, hydralazine  50 mg twice daily, Bystolic  20 mg daily, Benicar  20 mg twice daily.  Monitor with increased mobility and resume as needed             -11/19 Cr/BUN was a little higher this AM, encourage oral fluids, recheck tomorrow   -  Creatinine improved today to 0.88, sodium a little low at 132 but this appears to be more chronic issue, continue to monitor 9.  Hyperlipidemia.  Crestor  10.  Hypothyroidism.  Synthroid  11.  B12 deficiency.  Follow-up outpatient 12.  13 x 6 mm ground glass nodule right lung apex.  Follow-up outpatient 6 to 12 months chest CT. 13.  Class III obesity.  BMI 40.30.  Dietary follow-up 14.  History of alcohol and tobacco use.  Provide counseling 15. Leukocytosis             -Check CXR, UA, recheck tomorrow  -11/20 back incision looks better today no significant drainage noted and less redness, chest x-ray showed low lung volumes, incentive spirometry probably would be helpful to prevent atelectasis.  UA still not completed, discussed with nursing they said they are working on it.  WBC is still elevated but trended down to 18.7.  Not febrile.  Recheck labs again tomorrow.  Possible budesonide  could be contributing 16.  Collagenous colitis  - On Entocort (budesonide )  - Patient reporting some mild constipation, will add some Colace.  Being cautious with laxatives to avoid causing much diarrhea.  Will check x-ray regarding stool burden      LOS: 1 days A FACE TO FACE EVALUATION WAS PERFORMED  Murray Collier 05/15/2024, 2:33 PM

## 2024-05-15 NOTE — Evaluation (Signed)
 Physical Therapy Assessment and Plan  Patient Details  Name: Jenny Yates MRN: [id number] Date of Birth: 12-14-1960  PT Diagnosis: Abnormal posture, Abnormality of gait, Difficulty walking, Low back pain, and Muscle weakness Rehab Potential: Good ELOS: 7-10 days   Today's Date: 05/15/2024 PT Individual Time: 0900-1000, 8584-8474 PT Individual Time Calculation (min): 60 min, 70 min   Hospital Problem: Active Problems:   S/P lumbar fusion   Past Medical History:  Past Medical History:  Diagnosis Date   Anemia    Anxiety    Collagenous colitis    Depression    Hyperlipidemia    Hypertension    Hypothyroidism    Lumbar herniated disc    Panic disorder    Past Surgical History:  Past Surgical History:  Procedure Laterality Date   BACK SURGERY     2007   HERNIA REPAIR     TUBAL LIGATION      Assessment & Plan Clinical Impression: Patient is a 63 y.o. year old female with history significant for hypertension, anxiety/depression as well as panic disorders, hypothyroidism, class III obesity BMI 40.30, hyperlipidemia, tobacco/alcohol use and chronic back pain status post L4-5 fusion 04/23/2024 per Dr. Mavis who presented 05/08/2024 after mechanical fall at home with some noted altered mental status as well as noted to be hypotensive. Per chart review patient lives with spouse. Two-level home bed and bath main level 2 steps to entry. She had been using a rolling walker for mobility since recent back surgery. Cranial CT scan showed no acute intracranial abnormality as well as follow-up MRI of the brain showing moderate generalized atrophy no acute findings.. CT of the chest and abdomen showed a 13 x 6 mm ground glass nodule in the right lung apex recommending CT chest 6 to 12 months. CT lumbar spine showed possible nondisplaced fracture of the left L4 transverse process with hardware intact. Neurosurgery follow-up advised conservative care and patient is maintained in a lumbar corset  applied in sitting position. Noted admission labs unremarkable except sodium 122 BUN 29 creatinine 1.52, WBC 11,500, urinalysis positive nitrite with rare bacteria but no UTI symptoms she was initially placed on empiric antibiotics since discontinued, magnesium 1.4, ammonia level 28. In regards to patient's hyponatremia felt to be related to poor p.o. intake and HCTZ which was discontinued. She did receive IV fluid bolus with latest sodium 133 she did have a mild vitamin B12 deficiency. She initially required midodrine for hypotension which normalized and midodrine has been discontinued. In regards to patient's acute metabolic encephalopathy felt to be related to polypharmacy dehydration and mild AKI which has improved. Increased WBC today, husband reports hospitalist ordered UA, Cr a little higher, husband reports hospitalist recommended increased oral hydration. Therapy evaluations completed due to patient decreased functional mobility was admitted for a comprehensive rehab program.  Patient transferred to CIR on 05/14/2024 .   Patient currently requires min with mobility secondary to muscle weakness and muscle joint tightness, decreased cardiorespiratoy endurance, decreased coordination, and decreased sitting balance, decreased standing balance, decreased balance strategies, and difficulty maintaining precautions.  Prior to hospitalization, patient was modified independent  with mobility and lived with Spouse in a House home.  Home access is 2 (2 + a large threshold step)Stairs to enter.  Patient will benefit from skilled PT intervention to maximize safe functional mobility, minimize fall risk, and decrease caregiver burden for planned discharge home with 24 hour supervision.  Anticipate patient will benefit from follow up OP at discharge.  PT - End  of Session Activity Tolerance: Tolerates 30+ min activity with multiple rests Endurance Deficit: Yes PT Assessment Rehab Potential (ACUTE/IP ONLY): Good PT  Barriers to Discharge: None PT Patient demonstrates impairments in the following area(s): Balance;Endurance;Pain;Safety;Sensory;Skin Integrity PT Transfers Functional Problem(s): Bed Mobility;Bed to Chair;Car;Furniture PT Locomotion Functional Problem(s): Ambulation;Wheelchair Mobility;Stairs PT Plan PT Intensity: Minimum of 1-2 x/day ,45 to 90 minutes PT Frequency: 5 out of 7 days PT Duration Estimated Length of Stay: 7-10 days PT Treatment/Interventions: Ambulation/gait training;Discharge planning;Functional mobility training;Psychosocial support;Therapeutic Activities;Visual/perceptual remediation/compensation;Balance/vestibular training;Disease management/prevention;Neuromuscular re-education;Skin care/wound management;Therapeutic Exercise;Wheelchair propulsion/positioning;UE/LE Strength taining/ROM;Splinting/orthotics;Pain management;DME/adaptive equipment instruction;Cognitive remediation/compensation;Community reintegration;Functional electrical stimulation;Patient/family education;Stair training;UE/LE Coordination activities PT Transfers Anticipated Outcome(s): supervision PT Locomotion Anticipated Outcome(s): supervision/mod I PT Recommendation Recommendations for Other Services: Therapeutic Recreation consult Follow Up Recommendations: Outpatient PT Patient destination: Home Equipment Recommended: To be determined Equipment Details: Pt currently has RW, shower seat, tub/shower grab bars, and SPC   PT Evaluation Precautions/Restrictions Precautions Precautions: Fall;Back Recall of Precautions/Restrictions: Intact Precaution/Restrictions Comments: able to recall 3/3 precautions Required Braces or Orthoses: Spinal Brace (when OOB) Spinal Brace: Lumbar corset;Applied in sitting position Restrictions Weight Bearing Restrictions Per Provider Order: No Pain Interference Pain Interference Pain Effect on Sleep: 1. Rarely or not at all Pain Interference with Therapy Activities: 1.  Rarely or not at all Pain Interference with Day-to-Day Activities: 1. 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  Lives With: Spouse Prior Function Level of Independence: Requires assistive device for independence (Pt started to use cane in April, before then, no AD for ambulation)  Able to Take Stairs?: Yes Driving: Yes Vocation: Retired Leisure: Hobbies-yes (Comment) Vision/Perception  Vision - History Ability to See in Adequate Light: 0 Adequate Perception Perception: Within Functional Limits Praxis Praxis: WFL  Cognition Overall Cognitive Status: Impaired/Different from baseline (very mild deficits) Arousal/Alertness: Awake/alert Orientation Level: Oriented X4 Memory: Appears intact Awareness: Appears intact Problem Solving: Appears intact Safety/Judgment: Appears intact Sensation Sensation Light Touch: Appears Intact Hot/Cold: Appears Intact Proprioception: Appears Intact Stereognosis: Not tested Additional Comments: below knee to feet, pt reporting occasional tingling but not constant Coordination Gross Motor Movements are Fluid and Coordinated: No Fine Motor Movements are Fluid and Coordinated: Yes Coordination and Movement Description: general weakness and debility Motor  Motor Motor: Abnormal postural alignment and control Motor - Skilled Clinical Observations: generalized weakness and muscle fatigue   Trunk/Postural Assessment  Cervical  Assessment Cervical Assessment: Exceptions to [facility] (forward head) Thoracic Assessment Thoracic Assessment: Exceptions to [facility] (rounded shoulders) Lumbar Assessment Lumbar Assessment: Exceptions to [facility] (posterior pelvic tilt) Postural Control Postural Control: Deficits on evaluation Righting Reactions: delayed Postural Limitations: inadequate  Balance Balance Balance Assessed: Yes Static Sitting Balance Static Sitting - Balance Support: Feet supported Static Sitting - Level of Assistance: 5: Stand by assistance Dynamic Sitting Balance Dynamic Sitting - Balance Support: Feet supported Dynamic Sitting - Level of Assistance: 5: Stand by assistance Static Standing Balance Static Standing - Balance Support: Bilateral upper extremity supported Static Standing - Level of Assistance: 4: Min assist (CGA) Dynamic Standing Balance Dynamic Standing - Balance Support: During functional activity;Right upper extremity supported;Left upper extremity supported  Dynamic Standing - Level of Assistance: 4: Min assist (CGA) Extremity Assessment  RUE Assessment RUE Assessment: Exceptions to [facility] Active Range of Motion (AROM) Comments: WFL General Strength Comments: 4-/5, general weakness LUE Assessment LUE Assessment: Exceptions to [facility] Active Range of Motion (AROM) Comments: WFL General Strength Comments: 4-/5, general weakness RLE Assessment RLE Assessment: Exceptions to [facility] Active Range of Motion (AROM) Comments: WFL General Strength Comments: Hip grossly 3/5; knee/ankles grossly 4/5 LLE Assessment LLE Assessment: Exceptions to [facility] Active Range of Motion (AROM) Comments: WFL General Strength Comments: Hip grossly 3/5; knee/ankles grossly 4/5  Care Tool Care Tool Bed Mobility Roll left and right activity   Roll left and right assist level: Minimal Assistance - Patient > 75%    Sit to lying activity   Sit to lying assist level: Moderate Assistance - Patient 50 - 74%    Lying to sitting on  side of bed activity   Lying to sitting on side of bed assist level: the ability to move from lying on the back to sitting on the side of the bed with no back support.: Moderate Assistance - Patient 50 - 74%     Care Tool Transfers Sit to stand transfer   Sit to stand assist level: Contact Guard/Touching assist    Chair/bed transfer   Chair/bed transfer assist level: Contact Guard/Touching assist    Car transfer   Car transfer assist level: Moderate Assistance - Patient 50 - 74%      Care Tool Locomotion Ambulation   Assist level: Contact Guard/Touching assist Assistive device: Walker-rolling Max distance: 150  Walk 10 feet activity   Assist level: Contact Guard/Touching assist Assistive device: Walker-rolling   Walk 50 feet with 2 turns activity   Assist level: Contact Guard/Touching assist Assistive device: Walker-rolling  Walk 150 feet activity   Assist level: Contact Guard/Touching assist Assistive device: Walker-rolling  Walk 10 feet on uneven surfaces activity   Assist level: Contact Guard/Touching assist Assistive device: Walker-rolling  Stairs   Assist level: Minimal Assistance - Patient > 75% Stairs assistive device: 2 hand rails Max number of stairs: 12  Walk up/down 1 step activity   Walk up/down 1 step (curb) assist level: Contact Guard/Touching assist Walk up/down 1 step or curb assistive device: 2 hand rails  Walk up/down 4 steps activity   Walk up/down 4 steps assist level: Contact Guard/Touching assist Walk up/down 4 steps assistive device: 2 hand rails  Walk up/down 12 steps activity   Walk up/down 12 steps assist level: Minimal Assistance - Patient > 75% Walk up/down 12 steps assistive device: 2 hand rails  Pick up small objects from floor Pick up small object from the floor (from standing position) activity did not occur: Safety/medical concerns (weakness/fatigue/pain) Pick up small object from the floor assist level: Dependent - Patient 0%     Wheelchair Is the patient using a wheelchair?: Yes Type of Wheelchair: Manual   Wheelchair assist level: Moderate Assistance - Patient 50 - 74% Max wheelchair distance: 5 ft  Wheel 50 feet with 2 turns activity Wheelchair 50 feet with 2 turns activity did not occur: Safety/medical concerns (weakness/fatigue/pain)    Wheel 150 feet activity Wheelchair 150 feet activity did not occur: Safety/medical concerns (weakness/fatigue/pain)      Refer to Care Plan for Long Term Goals  SHORT TERM GOAL WEEK 1 PT Short Term Goal 1 (Week 1): Pt will perform supine <> sit transfers with min A PT Short Term Goal 2 (Week 1): Pt will perform  stand pivot transfers with supervision PT Short Term Goal 3 (Week 1): Pt will perform car transfer with min A  Recommendations for other services: Therapeutic Recreation  Pet therapy and Other groups  Session 1: Evaluation completed (see details above) with patient education regarding purpose of PT evaluation, PT POC and goals, therapy schedule, weekly team meetings, and other CIR information including safety plan and fall risk safety. Pt agreeable to PT eval. [name] husband present at bedside. Pt reported 2/10 pain in low back/base of spine. Pt with heightened anxiety at times cause some shakiness. Pt able to recall 3/3 spinal precautions. Pt required some assist with donning LSO brace. Pt has difficulty and significant fatigue with WC propulsion as this was a novel task her for. At end of session, pt supine in bed, bed alarm set, and all needs within reach. However, I do not anticipate she will need a WC. Pt performed the below functional mobility tasks with the specified levels of skilled cuing and assistance.  Session 2: Pt supine in bed upon arrival, husband at bedside. Pt reported 1/10 pain in low back and agreeable to therapy. Session emphasized functional strengthening and endurance with transfers, stair negotiation, and ambulation. Pt performed short distance  ambulatory transfer to toilet. Pt required min A for clothing management and sat to toilet with CGA. Pt continent of urine (nursing notified). Pt ambulated 150 ft on level surface using RW with CGA overall. Pt asc/desc 10 ft ramp using RW with CGA as well. Pt participated in car transfer with mod A, pt has a Honda CRV. Pt participated in stair negotiation - pt asc/desc 12-6 inch steps using B HR. Pt required CGA for initial 4 steps, however, required min A for steps 5-12 due to fatigue and weakness. At end of session, pt remained seated in recliner and all needs within reach and husband present.  Skilled Therapeutic Intervention Mobility Bed Mobility Bed Mobility: Rolling Right;Rolling Left;Supine to Sit;Sit to Supine Rolling Right: Minimal Assistance - Patient > 75% Rolling Left: Minimal Assistance - Patient > 75% Supine to Sit: Moderate Assistance - Patient 50-74% Sit to Supine: Moderate Assistance - Patient 50-74% Transfers Transfers: Sit to Stand;Stand to Sit;Stand Pivot Transfers Sit to Stand: Contact Guard/Touching assist Stand to Sit: Contact Guard/Touching assist Stand Pivot Transfers: Contact Guard/Touching assist Transfer (Assistive device): Rolling walker Locomotion  Gait Ambulation: Yes Gait Assistance: Contact Guard/Touching assist Assistive device: Rolling walker Gait Gait: Yes Gait Pattern: Impaired Gait Pattern: Trunk flexed;Step-through pattern;Decreased step length - right;Decreased step length - left;Wide base of support Gait velocity: decreased Stairs / Additional Locomotion Stairs: Yes Stairs Assistance: Minimal Assistance - Patient > 75%;Contact Guard/Touching assist (CGA for 4 steps, min A for 8 and 12 steps) Stair Management Technique: Two rails;Alternating pattern;Step to pattern Height of Stairs: 6 Ramp: Contact Guard/touching Technical Sales Engineer Mobility: Yes Wheelchair Assistance: Moderate Assistance - Patient 50 - 74% Wheelchair  Propulsion: Both upper extremities Wheelchair Parts Management: Needs assistance Distance: 5 ft   Discharge Criteria: Patient will be discharged from PT if patient refuses treatment 3 consecutive times without medical reason, if treatment goals not met, if there is a change in medical status, if patient makes no progress towards goals or if patient is discharged from hospital.  The above assessment, treatment plan, treatment alternatives and goals were discussed and mutually agreed upon: by patient and by family  Comer CHRISTELLA Levora Comer Levora, PT, DPT 05/15/2024, 4:17 PM

## 2024-05-15 NOTE — Progress Notes (Signed)
 Inpatient Rehabilitation  Patient information reviewed and entered into eRehab system by [facility]. Karen Kays., CCC/SLP, PPS Coordinator.  Information including medical coding, functional ability and quality indicators will be reviewed and updated through discharge.

## 2024-05-16 DIAGNOSIS — N3000 Acute cystitis without hematuria: Secondary | ICD-10-CM

## 2024-05-16 DIAGNOSIS — K52831 Collagenous colitis: Secondary | ICD-10-CM | POA: Diagnosis not present

## 2024-05-16 DIAGNOSIS — D72829 Elevated white blood cell count, unspecified: Secondary | ICD-10-CM | POA: Diagnosis not present

## 2024-05-16 DIAGNOSIS — N179 Acute kidney failure, unspecified: Secondary | ICD-10-CM | POA: Diagnosis not present

## 2024-05-16 DIAGNOSIS — Z981 Arthrodesis status: Secondary | ICD-10-CM | POA: Diagnosis not present

## 2024-05-16 LAB — CBC
HCT: 31.6 % — ABNORMAL LOW (ref 36.0–46.0)
Hemoglobin: 10.6 g/dL — ABNORMAL LOW (ref 12.0–15.0)
MCH: 31.5 pg (ref 26.0–34.0)
MCHC: 33.5 g/dL (ref 30.0–36.0)
MCV: 94 fL (ref 80.0–100.0)
Platelets: 253 K/uL (ref 150–400)
RBC: 3.36 MIL/uL — ABNORMAL LOW (ref 3.87–5.11)
RDW: 12.9 % (ref 11.5–15.5)
WBC: 14.2 K/uL — ABNORMAL HIGH (ref 4.0–10.5)
nRBC: 0 % (ref 0.0–0.2)

## 2024-05-16 LAB — PREALBUMIN: Prealbumin: 8 mg/dL — ABNORMAL LOW (ref 18–38)

## 2024-05-16 LAB — BASIC METABOLIC PANEL WITH GFR
Anion gap: 13 (ref 5–15)
BUN: 22 mg/dL (ref 8–23)
CO2: 23 mmol/L (ref 22–32)
Calcium: 8.7 mg/dL — ABNORMAL LOW (ref 8.9–10.3)
Chloride: 98 mmol/L (ref 98–111)
Creatinine, Ser: 0.82 mg/dL (ref 0.44–1.00)
GFR, Estimated: >60 mL/min (ref >60)
Glucose, Bld: 178 mg/dL — ABNORMAL HIGH (ref 70–99)
Potassium: 3.5 mmol/L (ref 3.5–5.1)
Sodium: 134 mmol/L — ABNORMAL LOW (ref 135–145)

## 2024-05-16 MED ORDER — BUDESONIDE 3 MG PO CPEP
6.0000 mg | ORAL_CAPSULE | Freq: Every day | ORAL | Status: DC
  Administered 2024-05-17 – 2024-05-21 (×5): 6 mg via ORAL
  Filled 2024-05-16 (×5): qty 2

## 2024-05-16 NOTE — Progress Notes (Signed)
 Physical Therapy Session Note  Patient Details  Name: Jenny Yates MRN: [id number] Date of Birth: 03-Nov-1960  Today's Date: 05/16/2024 PT Individual Time: 0901-1002, 1415-1530 PT Individual Time Calculation (min): 61 min, 75 min   Short Term Goals: Week 1:  PT Short Term Goal 1 (Week 1): Pt will perform supine <> sit transfers with min A PT Short Term Goal 2 (Week 1): Pt will perform stand pivot transfers with supervision PT Short Term Goal 3 (Week 1): Pt will perform car transfer with min A  Skilled Therapeutic Interventions/Progress Updates:     Treatment 1: Pt supine in bed upon arrival, husband donning pt's compression socks. Pt denies pain and a agreeable to therapy. Session emphasized functional strengthening and endurance with transfers. Pt sat to EOB with min A. Pt performed UB dressing and PT assisted with LB dressing. Pt stood to RW with CGA to donn LSO brace. Pt performed short distance ambulatory transfer to toilet with CGA. Pt continent of bladder and performed perihygiene. Min A required to reach pants to avoid breaking precautions, however, pt able to pull them up with CGA. Pt balanced at sink to wash her hands and brush her teeth with single UE support on sink. Pt transferred dependent in [facility] to/from ortho gym for time/energy conservation. Pt performed car transfers to a higher level (about the level of a Honda CRV) with and without 4 inch step. Pt practiced using step in forward and bkwd directions to get into car. Min A overall without step and CGA using step. [name] husband plans to measure height of seat in their car. At end of session pt transferred to recliner with CGA where she remained with husband present.  Treatment 2: Pt seated in recliner upon arrival. Pt denies pain and agreeable to therapy. Session emphasized functional strengthening, endurance, and independence with transfers and bed mobility. Pt transferred recliner <> WC with CGA using RW. Pt transferred dependent  in [facility] to/from rehab apartment for time/energy conservation. Pt practiced sitting to side of bed using 6 in step. Pt performed multiple trials of the following sequence - backwards step onto step from RW followed by sitting EOB with CGA, followed by log rolling to supine with mod A initially progressing to CGA. Pt required supine rest breaks due to fatigue. Pt stated practicing this task was very helpful and meaningful. Once back in room, pt performed short distance ambulatory transfer to toilet with CGA. Pt continent of bladder and performed perihygiene without assist, CGA required for clothing management. Pt seated in recliner, all needs within reach, and husband present in room at end of session.  Therapy Documentation Precautions:  Precautions Precautions: Fall, Back Recall of Precautions/Restrictions: Intact Precaution/Restrictions Comments: able to recall 3/3 precautions Required Braces or Orthoses: Spinal Brace (when OOB) Spinal Brace: Lumbar corset, Applied in sitting position Restrictions Weight Bearing Restrictions Per Provider Order: No  Therapy/Group: Individual Therapy  Comer CHRISTELLA Levora Comer Levora, PT, DPT 05/16/2024, 8:02 AM

## 2024-05-16 NOTE — Progress Notes (Addendum)
 PROGRESS NOTE   Subjective/Complaints: Patient had small bowel movement yesterday but says she feels a little constipated.  Patient reports for years she adjust her budesonide  dose from 9 mg to 6 or even 3mg  when this happens.  Abdominal x-ray showed only mild colonic stool burden.  Patient was started on antibiotic for UTI yesterday.  Husband reports incision looked better this morning without significant drainage.  ROS: No fever, chills, chest pain, new motor or sensory changes, chronic anxiety, nausea, vomiting Son chronic shortness of breath at baseline + Constipation  Objective:   DG Abd 1 View Result Date: 05/15/2024 CLINICAL DATA:  Constipation. EXAM: ABDOMEN - 1 VIEW COMPARISON:  None Available. FINDINGS: Mild colonic stool burden. No bowel dilatation or evidence of obstruction. No free air or radiopaque calculi. Right upper quadrant cholecystectomy clips. Lower lumbar spinal fusion hardware. No acute osseous pathology. IMPRESSION: Mild colonic stool burden. No bowel obstruction. Electronically Signed   By: Vanetta Chou M.D.   On: 05/15/2024 19:48   DG Chest 2 View Result Date: 05/14/2024 EXAM: 2 VIEW(S) XRAY OF THE CHEST 05/14/2024 05:58:00 PM COMPARISON: CT chest 05/25/2024, chest x-ray 05/08/2024. CLINICAL HISTORY: Leukocytosis. FINDINGS: LUNGS AND PLEURA: Low lung volumes. Perihilar interstitial thickening. No focal pulmonary opacity. No pleural effusion. No pneumothorax. HEART AND MEDIASTINUM: Unchanged cardiomediastinal silhouette. BONES AND SOFT TISSUES: No acute osseous abnormality. IMPRESSION: 1. Low lung volumes with perihilar interstitial thickening. Electronically signed by: Kate Plummer MD 05/14/2024 11:28 PM EST RP Workstation: [id number]   Recent Labs    05/15/24 0442 05/16/24 0612  WBC 18.7* 14.2*  HGB 10.2* 10.6*  HCT 30.9* 31.6*  PLT 242 253   Recent Labs    05/15/24 0442 05/16/24 0612  NA 132*  134*  K 3.5 3.5  CL 96* 98  CO2 23 23  GLUCOSE 99 178*  BUN 15 22  CREATININE 0.88 0.82  CALCIUM  8.8* 8.7*    Intake/Output Summary (Last 24 hours) at 05/16/2024 1239 Last data filed at 05/16/2024 0100 Gross per 24 hour  Intake 540 ml  Output 350 ml  Net 190 ml        Physical Exam: Vital Signs Blood pressure (!) 147/73, pulse 69, temperature 98.1 F (36.7 C), temperature source Oral, resp. rate 17, height 4' 11 (1.499 m), weight 91.7 kg, SpO2 94%.  General: No apparent distress, appears comfortable sitting in wheelchair HEENT: Head is normocephalic, atraumatic, sclera anicteric, oral mucosa dry,  wearing glasses Heart: Reg rate and rhythm. No murmurs rubs or gallops Chest: CTA bilaterally without wheezes, rales, or rhonchi; no distress Abdomen: Soft, non-tender, non-distended, bowel sounds positive. Extremities: No clubbing, cyanosis, or edema. Pulses are 2+ Psych: Pleasant, smiling when I came in skin: Incision looked better yesterday-not seen today yet-discussed with patient and husband asking nursing to take a picture of it after she gets back into bed later today  neuro: Alert and awake, responses little delayed, fluent, follows commands, cranial nerves II through XII grossly intact,      MOTOR: RUE: 4-/5 Deltoid, 4/5 Biceps, 4/5 Triceps,4/5 Grip LUE: 4-/5 Deltoid, 4/5 Biceps, 4/5 Triceps, 4/5 Grip RLE: HF 3/5, KE 4/5, ADF 4/5, APF 4/5 LLE: HF 3/5, KE  4/5, ADF 4/5, APF 4/5    SENSORY: Normal to touch all 4 extremities   MSK: no joint swelling  Prior neuro assessment is c/w today's exam 05/16/2024.  ADDENDUM photo 05/16/24      Assessment/Plan: 1. Functional deficits which require 3+ hours per day of interdisciplinary therapy in a comprehensive inpatient rehab setting. Physiatrist is providing close team supervision and 24 hour management of active medical problems listed below. Physiatrist and rehab team continue to assess barriers to discharge/monitor  patient progress toward functional and medical goals  Care Tool:  Bathing              Bathing assist Assist Level: Minimal Assistance - Patient > 75%     Upper Body Dressing/Undressing Upper body dressing        Upper body assist Assist Level: Contact Guard/Touching assist    Lower Body Dressing/Undressing Lower body dressing            Lower body assist Assist for lower body dressing: Moderate Assistance - Patient 50 - 74%     Toileting Toileting    Toileting assist Assist for toileting: Minimal Assistance - Patient > 75%     Transfers Chair/bed transfer  Transfers assist     Chair/bed transfer assist level: Contact Guard/Touching assist     Locomotion Ambulation   Ambulation assist      Assist level: Contact Guard/Touching assist Assistive device: Walker-rolling Max distance: 150   Walk 10 feet activity   Assist     Assist level: Contact Guard/Touching assist Assistive device: Walker-rolling   Walk 50 feet activity   Assist    Assist level: Contact Guard/Touching assist Assistive device: Walker-rolling    Walk 150 feet activity   Assist    Assist level: Contact Guard/Touching assist Assistive device: Walker-rolling    Walk 10 feet on uneven surface  activity   Assist     Assist level: Contact Guard/Touching assist Assistive device: Walker-rolling   Wheelchair     Assist Is the patient using a wheelchair?: Yes Type of Wheelchair: Manual    Wheelchair assist level: Moderate Assistance - Patient 50 - 74% Max wheelchair distance: 5 ft    Wheelchair 50 feet with 2 turns activity    Assist    Wheelchair 50 feet with 2 turns activity did not occur:  (weakness/fatigue/pain)   Assist Level: Total Assistance - Patient < 25%   Wheelchair 150 feet activity     Assist  Wheelchair 150 feet activity did not occur:  (weakness/fatigue/pain)   Assist Level: Total Assistance - Patient < 25%   Blood  pressure (!) 147/73, pulse 69, temperature 98.1 F (36.7 C), temperature source Oral, resp. rate 17, height 4' 11 (1.499 m), weight 91.7 kg, SpO2 94%.  Medical Problem List and Plan: 1. Functional deficits secondary to history of L4-L5 PLIF 04/23/2024 by Dr. Mavis due to spinal stenosis, lumbar radiculopathy and  neurogenic claudication complicated by dehydration/acute metabolic encephalopathy. She had likely nondisplaced fracture of the left L4 transverse process after a fall              -patient may shower-cover incision please              -ELOS/Goals: 7-10 days, sup PT, Sup to min A OT             - Continue CIR 2.  Antithrombotics: -DVT/anticoagulation:  Mechanical: Antiembolism stockings, thigh (TED hose) Bilateral lower extremities             -  antiplatelet therapy: N/A 3. Pain Management: Celebrex  200 mg daily, oxycodone  5 mg every 8 hours as needed moderate pain 4. Mood/Behavior/Sleep/anxiety/panic attacks.  Xanax  0.25 mg nightly as needed             -antipsychotic agents: N/A 5. Neuropsych/cognition: This patient is capable of making decisions on her own behalf. 6. Skin/Wound Care: Routine skin check 7. Fluids/Electrolytes/Nutrition: Routine in and outs with follow-up chemistries  - 11/20 check prealbumin tomorrow, continue protein supplements 8.  AKI/hyponatremia/hypotension.  Patient initially with ProAmatine  since discontinued.  Prior to admission patient on HCTZ 25 mg daily, hydralazine  50 mg twice daily, Bystolic  20 mg daily, Benicar  20 mg twice daily.  Monitor with increased mobility and resume as needed             -11/19 Cr/BUN was a little higher this AM, encourage oral fluids, recheck tomorrow   - Creatinine improved today to 0.88, sodium a little low at 132 but this appears to be more chronic issue, continue to monitor  - 11/21 sodium improved to 134, prealbumin low nutrition consult encourage protein intake 9.  Hyperlipidemia.  Crestor  10.  Hypothyroidism.   Synthroid  11.  B12 deficiency.  Follow-up outpatient 12.  13 x 6 mm ground glass nodule right lung apex.  Follow-up outpatient 6 to 12 months chest CT. 13.  Class III obesity.  BMI 40.30.  Dietary follow-up 14.  History of alcohol and tobacco use.  Provide counseling 15. Leukocytosis             -Check CXR, UA, recheck tomorrow  -11/20 back incision looks better today no significant drainage noted and less redness, chest x-ray showed low lung volumes, incentive spirometry probably would be helpful to prevent atelectasis.  UA still not completed, discussed with nursing they said they are working on it.  WBC is still elevated but trended down to 18.7.  Not febrile.  Recheck labs again tomorrow.  Possible budesonide  could be contributing  - 11/21 patient to get nursing to take picture of incision after she gets back into bed.  WBC improved to 14.2. Continue monitor incision 16.  Collagenous colitis  - On Entocort (budesonide )  - Patient reporting some mild constipation, will add some Colace.  Being cautious with laxatives to avoid causing much diarrhea.  Will check x-ray regarding stool burden  - 11/21 decrease budesonide  to 6 mg, discussed MiraLAX  as needed.  Will try to avoid stimulant laxatives   17. UTI  - 11/21 Duricef started monitor cultures  LOS: 2 days A FACE TO FACE EVALUATION WAS PERFORMED  Murray Collier 05/16/2024, 12:39 PM

## 2024-05-16 NOTE — Progress Notes (Signed)
 [facility] Individual Statement of Services  Patient Name:  Jenny Yates  Date:  05/16/2024  Welcome to the [facility].  Our goal is to provide you with an individualized program based on your diagnosis and situation, designed to meet your specific needs.  With this comprehensive rehabilitation program, you will be expected to participate in at least 3 hours of rehabilitation therapies Monday-Friday, with modified therapy programming on the weekends.  Your rehabilitation program will include the following services:  Physical Therapy (PT), Occupational Therapy (OT), Speech Therapy (ST), 24 hour per day rehabilitation nursing, Therapeutic Recreaction (TR), Care Coordinator, Rehabilitation Medicine, Nutrition Services, and Pharmacy Services  Weekly team conferences will be held on Wednesday to discuss your progress.  Your Inpatient Rehabilitation Care Coordinator will talk with you frequently to get your input and to update you on team discussions.  Team conferences with you and your family in attendance may also be held.  Expected length of stay: 7-10 years Overall anticipated outcome: Supervision/Verbal cueing   Depending on your progress and recovery, your program may change. Your Inpatient Rehabilitation Care Coordinator will coordinate services and will keep you informed of any changes. Your Inpatient Rehabilitation Care Coordinator's name and contact numbers are listed  below.  The following services may also be recommended but are not provided by the [facility]:  Driving Evaluations Home Health Rehabiltiation Services Outpatient Rehabilitation Services Vocational Rehabilitation   Arrangements will be made to provide these services after discharge if needed.  Arrangements include referral to agencies that provide these services.  Your insurance has been verified to be: [employer]ET[employer] / [employer]  Your primary doctor is:  Fleeta Valeria Mayo, MD  Pertinent information will be shared with your doctor and your insurance company.  Inpatient Rehabilitation Care Coordinator:  Di'Asia Loreli SIERRAS [phone] or ELIGAH BRINKS  Information discussed with and copy given to patient by: [name], 05/16/2024, 10:50 AM

## 2024-05-16 NOTE — Discharge Summary (Signed)
 Physician Discharge Summary   Patient: Jenny Yates MRN: [id number] DOB: 06-24-1961  Admit date:     05/08/2024  Discharge date: 05/14/2024  Discharge Physician: Duffy Al-Sultani   PCP: Fleeta Valeria Mayo, MD   Recommendations at discharge:   Follow up on Cr levels, patient requested deferring starting IVF in favor of attempts at improved PO intake. Follow up on UA and Urine cultures. Patient with symptoms of UTI but antibiotics deferred until urine sample was provided.   Discharge Diagnoses: Principal Problem:   AKI (acute kidney injury) Active Problems:   Essential hypertension   Hyperlipidemia   Chronic pain syndrome   GAD (generalized anxiety disorder)   Hypothyroidism   Collagenous colitis   Hyponatremia  Resolved Problems:   * No resolved hospital problems. *  Hospital Course: 64 year old female, lives with her spouse who is a retired Insurance Underwriter, medical history significant for HTN, anxiety/depression, chronic pain, s/p L4-L5 fusion on 04/23/2024 who presented to the ED following mechanical fall at home, confusion, noted to have hypotension, hyponatremia, acute kidney injury in the setting of dehydration.   Mechanical fall at home Physical deconditioning Recent spine surgery Apart from suspicious nondisplaced fracture of left L4 transverse process, extensive imaging on admission without acute findings. Fall likely multifactorial due to dehydration, hypotension, polypharmacy. Therapies have recommended AIR Patient was medically optimized for DC to CIR.    Dehydration with hyponatremia Secondary to poor oral intake at home, and HCTZ. Presented with serum sodium of 122. Post bolus IV fluids and brief maintenance IV fluids (stopped on hospital day 1), serum sodium has been stable in the low 130s.  Per spouse, oral intake has gradually improved.  Hesitant to add Remeron due to ongoing issues with AMS.   Sodium normal at 137 at the time of discharge  AKI Cr was elevated to  high of 1.60 on admission thought to be likely hemodynamically mediated in the setting of poor oral intake and hydrochlorothiazide  + ARB use at home. UA negative for proteinuria. Resolved with IVFs  Was again noted to be elevated on the day of discharge to 1.12 from 0.8 the prior day. Discussed with patient and husband starting IVFs but they ultimately opted for trial of increased PO intake for one day instead. Recommend continued monitoring of creatinine and ensuring resolution of renal failure with continued encouragement of PO intake.   Possible UTI Leukocytosis Patient complained of symptoms of dysuria on the day of discharge with leukocytosis elevated to 21.1. Was unable to provide urine sample prior to discharge to CIR. Recommended holding off antibiotics until a UA sample is collected to enable successful culture of urinary pathogen. Please follow up on UA collection, start antibiotics and adjust based on Ucx growth and susceptibilities.   Hypokalemia Hypomagnesemia Replaced.   Hypotension Not sure if all of this is due to volume depletion. Home antihypertensives were held (PTA meds: Hydralazine  50 mg twice daily, HCTZ 25 mg daily, Bystolic  20 mg nightly, Benicar  20 mg twice daily) For the first 48 hours, had hypotension with SBP in the 80s.  Midodrine  had been added on admission.  However blood pressures normalized and even started getting in the hypertensive range, therefore, midodrine  was discontinued. Unclear if the prolonged hypotension on admission was due to residual effect of polypharmacy antihypertensives that she was on PTA.   ACTH  stim test drawn 11/17 was an incomplete test.  Base cortisol <0.4, cortisol after 30 minutes 5 but no cortisol level drawn after 60 minutes.  Since 30  minutes cortisol level <18, suggestive of adrenal insufficiency.  However in the absence of ongoing hypotension or significant electrolyte abnormality, not sure if steroids need to be initiated. BP holding  well in low-normal range on discharge. Home antihypertensives held accordingly. Recommend continued monitoring and resumption as tolerated. Ultimately, may require monitoring and titration in the outpatient setting.  Acute metabolic encephalopathy Multifactorial due to polypharmacy, dehydration and AKI.  As per discussion with spouse, history of significant alcohol use disorder in the recent past. CT head without acute findings.  MRI brain without acute stroke or acute findings. Delirium precautions.  Minimize opioids and sedatives.  On reduced dose Xanax  here-per patient report, has not really taken much of it over the last 2 weeks.  Discontinued Flexeril .  Monitor. Check thiamine -pending today, B12 levels/2288 (hold B12 supplements and may have to reduce dose at DC).,  Completed high-dose IV thiamine  x 3 days, continue thiamine  100 mg daily by mouth. Given history of snoring, VBG without CO2 retention.  Ammonia level normal.  Has not gotten any opioids since a single dose on 11/13 night. Initial UA with rare bacteria and significant pyuria but no UTI symptoms.  After detailed discussion with spouse, monitored off of antibiotics.  Continued to have ongoing waxing and waning mental status changes.  However no agitation.  Consider outpatient neurocognitive consultation with Neurology.   Essential hypertension Controlled off of meds.  Continue to hold home antihypertensives as noted above   Alcohol use disorder Per spouse's report, patient used to drink 4 to 5 glasses of red wine daily prior to recent spine surgery but since then has been drinking maybe half a glass of wine daily.  No alcohol withdrawal noted.   S/p L4-5 fusion 04/23/2024 Extensive CT T/L/C-spine as noted below, suspicious for nondisplaced fracture of left L4 transverse process. Neurosurgery followed hospital course.  No recommendations except pursuing rehab.   Acute blood loss anemia Hemoglobin 13.7 on 10/27.  Hemoglobin  remained stable in the 10s-11s. Noted 11.8 on day of discharge.   Hypothyroidism Continue Synthroid .  TSH in June normal.   Vitamin B12 deficiency Continue supplementation, B12 levels as above.  Could consider reducing B12 supplements at DC.   Anxiety disorder/mood disorder As needed Xanax -has not received any here and was not getting much of it at home in the last 2 weeks.  Prior to that was taking more regularly.   Collagenous colitis No diarrhea Continue budesonide    13 x 6 mm ground glass nodule in the right lung apex Noted on CT C/A/P 11/13 Per recommendations, chest CT at 6 and 12 months to confirm persistence, if persistent then CT every 2 years until 5 years.  If growth or solid components develops, consider resection.  Outpatient follow-up.   Body mass index is 40.3 kg/m.  Class III obesity Complicates care.  Outpatient follow-up.        Consultants: Neurosurgery Procedures performed: None  Disposition: AIR at CIR Diet recommendation:  Discharge Diet Orders (From admission, onward)     Start     Ordered   05/14/24 0000  Diet - low sodium heart healthy        05/14/24 1246            DISCHARGE MEDICATION: Allergies as of 05/14/2024       Reactions   Codeine Other (See Comments)   Syncope knocks me to the floor   Epinephrine  Other (See Comments)   Oral seizure-like activity   Azithromycin Rash   Iodine Swelling,  Rash        Medication List     PAUSE taking these medications    hydrALAZINE  50 MG tablet Wait to take this until your doctor or other care provider tells you to start again. Commonly known as: APRESOLINE  Take 1 tablet (50 mg total) by mouth in the morning and at bedtime. TAKE 1 TABLET 2 TIMES DAILYWITH FOOD   hydrochlorothiazide  25 MG tablet Wait to take this until your doctor or other care provider tells you to start again. Commonly known as: HYDRODIURIL  TAKE 1 TABLET DAILY   nebivolol  10 MG tablet Wait to take this until  your doctor or other care provider tells you to start again. Commonly known as: BYSTOLIC  Take 2 tablets (20 mg total) by mouth daily. What changed: when to take this   olmesartan  20 MG tablet Wait to take this until your doctor or other care provider tells you to start again. Commonly known as: BENICAR  Take 1 tablet (20 mg total) by mouth in the morning and at bedtime.       STOP taking these medications    BENADRYL PO   cyclobenzaprine  5 MG tablet Commonly known as: FLEXERIL    oxyCODONE -acetaminophen  5-325 MG tablet Commonly known as: PERCOCET/ROXICET       TAKE these medications    acetaminophen  325 MG tablet Commonly known as: TYLENOL  Take 2 tablets (650 mg total) by mouth every 6 (six) hours as needed for mild pain (pain score 1-3) or fever (or Fever >/= 101). What changed:  medication strength how much to take when to take this reasons to take this additional instructions   acetaminophen  650 MG suppository Commonly known as: TYLENOL  Place 1 suppository (650 mg total) rectally every 6 (six) hours as needed for mild pain (pain score 1-3) or fever (or Fever >/= 101). What changed: You were already taking a medication with the same name, and this prescription was added. Make sure you understand how and when to take each.   albuterol  (2.5 MG/3ML) 0.083% nebulizer solution Commonly known as: PROVENTIL  Take 3 mLs (2.5 mg total) by nebulization every 2 (two) hours as needed for wheezing.   ALPRAZolam  0.25 MG tablet Commonly known as: XANAX  Take 1 tablet (0.25 mg total) by mouth at bedtime as needed for anxiety. What changed:  medication strength how much to take when to take this reasons to take this   bisacodyl  10 MG suppository Commonly known as: DULCOLAX Place 1 suppository (10 mg total) rectally daily as needed for moderate constipation.   budesonide  3 MG 24 hr capsule Commonly known as: ENTOCORT EC  TAKE 3 CAPSULES BY MOUTH EVERY DAY   celecoxib  200 MG  capsule Commonly known as: CELEBREX  Take 200 mg by mouth daily.   cyanocobalamin  1000 MCG tablet Commonly known as: VITAMIN B12 Take 1,000 mcg by mouth in the morning.   lactose free nutrition Liqd Take 237 mLs by mouth daily.   levothyroxine  100 MCG tablet Commonly known as: SYNTHROID  Take 1 tablet by mouth once daily   mouth rinse Liqd solution 15 mLs by Mouth Rinse route as needed (for oral care).   multivitamin with minerals Tabs tablet Take 1 tablet by mouth in the morning.   oxyCODONE  5 MG immediate release tablet Commonly known as: Oxy IR/ROXICODONE  Take 1 tablet (5 mg total) by mouth every 8 (eight) hours as needed for moderate pain (pain score 4-6).   polyethylene glycol 17 g packet Commonly known as: MIRALAX  / GLYCOLAX  Take 17 g by mouth  daily as needed for mild constipation.   rosuvastatin  40 MG tablet Commonly known as: Crestor  Take 1 tablet (40 mg total) by mouth daily. What changed: when to take this   thiamine  100 MG tablet Commonly known as: Vitamin B-1 Take 1 tablet (100 mg total) by mouth daily.   VITAMIN D3 PO Take 1,000 Units by mouth in the morning and at bedtime.        Discharge Exam: Filed Weights   05/08/24 1156 05/08/24 1157 05/12/24 1900  Weight: 90 kg 90 kg 90.5 kg   Gen: NAD, A&Ox3, obese female, sitting in chair HEENT: NCAT, EOMI Neck: Supple, no JVD CV: RRR, no murmurs Resp: normal WOB, CTAB, no w/r/r Abd: Soft, NTND, no guarding, BS normoactive Ext: No LE edema, pulses 2+ b/l Skin: Warm, dry, no rashes/lesions Neuro: CN II-XII grossly intact, strength 5/5 b/l, sensation intact Psych: Calm, cooperative, appropriate affect   Condition at discharge: good  The results of significant diagnostics from this hospitalization (including imaging, microbiology, ancillary and laboratory) are listed below for reference.   Imaging Studies: DG Abd 1 View Result Date: 05/15/2024 CLINICAL DATA:  Constipation. EXAM: ABDOMEN - 1 VIEW  COMPARISON:  None Available. FINDINGS: Mild colonic stool burden. No bowel dilatation or evidence of obstruction. No free air or radiopaque calculi. Right upper quadrant cholecystectomy clips. Lower lumbar spinal fusion hardware. No acute osseous pathology. IMPRESSION: Mild colonic stool burden. No bowel obstruction. Electronically Signed   By: Vanetta Chou M.D.   On: 05/15/2024 19:48   DG Chest 2 View Result Date: 05/14/2024 EXAM: 2 VIEW(S) XRAY OF THE CHEST 05/14/2024 05:58:00 PM COMPARISON: CT chest 05/25/2024, chest x-ray 05/08/2024. CLINICAL HISTORY: Leukocytosis. FINDINGS: LUNGS AND PLEURA: Low lung volumes. Perihilar interstitial thickening. No focal pulmonary opacity. No pleural effusion. No pneumothorax. HEART AND MEDIASTINUM: Unchanged cardiomediastinal silhouette. BONES AND SOFT TISSUES: No acute osseous abnormality. IMPRESSION: 1. Low lung volumes with perihilar interstitial thickening. Electronically signed by: Morgane Naveau MD 05/14/2024 11:28 PM EST RP Workstation: [id number]   MR BRAIN WO CONTRAST Result Date: 05/10/2024 EXAM: MRI BRAIN WITHOUT CONTRAST 05/10/2024 01:34:00 PM TECHNIQUE: Multiplanar multisequence MRI of the head/brain was performed without the administration of intravenous contrast. COMPARISON: None available. CLINICAL HISTORY: Mental status change, unknown cause. FINDINGS: BRAIN AND VENTRICLES: Moderate generalized atrophy and white matter changes are advanced for age. White matter changes extend into the brainstem. No acute infarct. No intracranial hemorrhage. No mass. No midline shift. No hydrocephalus. The sella is unremarkable. Normal flow voids. ORBITS: No acute abnormality. SINUSES AND MASTOIDS: No acute abnormality. BONES AND SOFT TISSUES: Normal marrow signal. No acute soft tissue abnormality. IMPRESSION: 1. No acute findings. 2. Moderate generalized atrophy and advanced-for-age white matter changes extending into the brainstem. Electronically signed by:  Lonni Necessary MD 05/10/2024 02:33 PM EST RP Workstation: [id number]   CT T-SPINE NO CHARGE Result Date: 05/08/2024 CLINICAL DATA:  63 year old with fall this morning and complaint of back pain. Had back surgery 2 weeks ago per husband, per patient she had back surgery 15 years ago, arrives with back brace EXAM: CT THORACIC SPINE WITHOUT CONTRAST; CT LUMBAR SPINE WITHOUT CONTRAST TECHNIQUE: Multidetector CT images of the thoracic and lumbar spine were obtained using the standard protocol without intravenous contrast. RADIATION DOSE REDUCTION: This exam was performed according to the departmental dose-optimization program which includes automated exposure control, adjustment of the mA and/or kV according to patient size and/or use of iterative reconstruction technique. COMPARISON:  MRI lumbar spine 03/23/2024, lumbar spine radiographs 02/04/2024 and  01/25/2024 FINDINGS: There are postsurgical changes relating to L4-L5 posterior spinal fusion with transpedicular screws, lateral rods and interbody device. There is 5 mm grade 1 anterolisthesis of L4 on L5, post fusion, similar to preoperative exam. The orthopedic hardware appears intact. The left L5 transpedicular screw tip extends slightly beyond the anterior L5 vertebral body cortex. Alignment: There is otherwise no significant listhesis. Vertebrae: There is suggestion of nondisplaced fracture of the left L4 transverse process (series 3, image 57 and series 5, image 39) with possible extension into the facet adjacent to the transpedicular screw (series 3, image 57). Otherwise no acute fracture or focal pathologic process. Again limbus L4 vertebra. Paraspinal and other soft tissues: Ill-defined right apical pulmonary ground-glass opacity, please see separately dictated report for CT chest, abdomen, and pelvis for further relevant evaluation. There is multilevel degenerative intervertebral disc height loss. IMPRESSION: Suspicion for nondisplaced fracture of the  left L4 transverse process with possible extension adjacent to the transpedicular screw. Otherwise no acute fracture or traumatic malalignment of the thoracic or lumbar spine. Postsurgical changes relating to L4-L5 posterior spinal fusion. The orthopedic hardware appears intact. Electronically Signed   By: Prentice Spade M.D.   On: 05/08/2024 10:29   CT L-SPINE NO CHARGE Result Date: 05/08/2024 CLINICAL DATA:  63 year old with fall this morning and complaint of back pain. Had back surgery 2 weeks ago per husband, per patient she had back surgery 15 years ago, arrives with back brace EXAM: CT THORACIC SPINE WITHOUT CONTRAST; CT LUMBAR SPINE WITHOUT CONTRAST TECHNIQUE: Multidetector CT images of the thoracic and lumbar spine were obtained using the standard protocol without intravenous contrast. RADIATION DOSE REDUCTION: This exam was performed according to the departmental dose-optimization program which includes automated exposure control, adjustment of the mA and/or kV according to patient size and/or use of iterative reconstruction technique. COMPARISON:  MRI lumbar spine 03/23/2024, lumbar spine radiographs 02/04/2024 and 01/25/2024 FINDINGS: There are postsurgical changes relating to L4-L5 posterior spinal fusion with transpedicular screws, lateral rods and interbody device. There is 5 mm grade 1 anterolisthesis of L4 on L5, post fusion, similar to preoperative exam. The orthopedic hardware appears intact. The left L5 transpedicular screw tip extends slightly beyond the anterior L5 vertebral body cortex. Alignment: There is otherwise no significant listhesis. Vertebrae: There is suggestion of nondisplaced fracture of the left L4 transverse process (series 3, image 57 and series 5, image 39) with possible extension into the facet adjacent to the transpedicular screw (series 3, image 57). Otherwise no acute fracture or focal pathologic process. Again limbus L4 vertebra. Paraspinal and other soft tissues:  Ill-defined right apical pulmonary ground-glass opacity, please see separately dictated report for CT chest, abdomen, and pelvis for further relevant evaluation. There is multilevel degenerative intervertebral disc height loss. IMPRESSION: Suspicion for nondisplaced fracture of the left L4 transverse process with possible extension adjacent to the transpedicular screw. Otherwise no acute fracture or traumatic malalignment of the thoracic or lumbar spine. Postsurgical changes relating to L4-L5 posterior spinal fusion. The orthopedic hardware appears intact. Electronically Signed   By: Prentice Spade M.D.   On: 05/08/2024 10:29   CT CHEST ABDOMEN PELVIS WO CONTRAST Result Date: 05/08/2024 EXAM: CT CHEST, ABDOMEN AND PELVIS WITHOUT CONTRAST 05/08/2024 08:48:00 AM TECHNIQUE: CT of the chest, abdomen and pelvis was performed without the administration of intravenous contrast. Multiplanar reformatted images are provided for review. Automated exposure control, iterative reconstruction, and/or weight based adjustment of the mA/kV was utilized to reduce the radiation dose to as low as  reasonably achievable. COMPARISON: None available. CLINICAL HISTORY: Polytrauma, blunt. FINDINGS: CHEST: MEDIASTINUM AND LYMPH NODES: Heart and pericardium are unremarkable. The central airways are clear. No mediastinal, hilar or axillary lymphadenopathy. LUNGS AND PLEURA: Minimal bibasilar subsegmental atelectasis is noted. A 13 x 6 mm ground-glass opacity is seen in the right lung apex. Follow-up unenhanced chest CT in 3 months is recommended to ensure stability and rule out neoplasm. No focal consolidation or pulmonary edema. No pleural effusion or pneumothorax. ABDOMEN AND PELVIS: LIVER: The liver is unremarkable. GALLBLADDER AND BILE DUCTS: Status post cholecystectomy. No biliary ductal dilatation. SPLEEN: No acute abnormality. PANCREAS: No acute abnormality. ADRENAL GLANDS: No acute abnormality. KIDNEYS, URETERS AND BLADDER: No  stones in the kidneys or ureters. No hydronephrosis. No perinephric or periureteral stranding. Urinary bladder is unremarkable. GI AND BOWEL: Stomach demonstrates no acute abnormality. There is no bowel obstruction. REPRODUCTIVE ORGANS: No acute abnormality. PERITONEUM AND RETROPERITONEUM: No ascites. No free air. VASCULATURE: Aorta is normal in caliber. ABDOMINAL AND PELVIS LYMPH NODES: No lymphadenopathy. REPRODUCTIVE ORGANS: No acute abnormality. BONES AND SOFT TISSUES: No acute osseous abnormality. No focal soft tissue abnormality. IMPRESSION: 1. 13 x 6 mm ground-glass nodule in the right lung apex. Recommend chest CT at 612 months to confirm persistence; if persistent, then CT every 2 years until 5 years. If growth or solid component develops, consider resection. 2. No definite traumatic abnormality seen. Electronically signed by: Lynwood Seip MD 05/08/2024 09:38 AM EST RP Workstation: [id number]   CT CERVICAL SPINE WO CONTRAST Result Date: 05/08/2024 CLINICAL DATA:  63 year old with blunt poly trauma EXAM: CT CERVICAL SPINE WITHOUT CONTRAST TECHNIQUE: Multidetector CT imaging of the cervical spine was performed without intravenous contrast. Multiplanar CT image reconstructions were also generated. RADIATION DOSE REDUCTION: This exam was performed according to the departmental dose-optimization program which includes automated exposure control, adjustment of the mA and/or kV according to patient size and/or use of iterative reconstruction technique. COMPARISON:  None Available. FINDINGS: Alignment: There is overall straightening of the normal cervical lordosis with multilevel trace, likely degenerative, anterolisthesis C2 on C3, C3 on C4, and C4 on C5 without evidence of acute or traumatic vertebral column listhesis. Skull base and vertebrae: No acute fracture. No primary bone lesion or focal pathologic process. Soft tissues and spinal canal: Limited evaluation of the spinal canal secondary to photon  starvation artifact. Within this limitation, no high-grade canal stenosis is seen. Unremarkable extra-spinal soft tissues. Disc levels: There is multilevel uncovertebral spurring and facet arthropathy with up to moderate/ severe neural foraminal narrowing, generally worse on the left. Upper chest: See dedicated chest imaging. Other: None. IMPRESSION: No acute fracture or traumatic malalignment of the cervical spine. Electronically Signed   By: Prentice Spade M.D.   On: 05/08/2024 09:26   CT HEAD WO CONTRAST Result Date: 05/08/2024 CLINICAL DATA:  63 year old with moderate to severe head trauma EXAM: CT HEAD WITHOUT CONTRAST TECHNIQUE: Contiguous axial images were obtained from the base of the skull through the vertex without intravenous contrast. RADIATION DOSE REDUCTION: This exam was performed according to the departmental dose-optimization program which includes automated exposure control, adjustment of the mA and/or kV according to patient size and/or use of iterative reconstruction technique. COMPARISON:  None Available. FINDINGS: Brain: No evidence of acute infarction, hemorrhage, hydrocephalus, extra-axial collection or mass lesion/mass effect.Moderate subcortical and periventricular hypodensity which is nonspecific but most likely represents the sequela chronic microvascular ischemia. Small chronic infarction left caudate head. Vascular: No hyperdense vessel or unexpected calcification. Skull: Normal. Negative for fracture  or focal lesion. Sinuses/Orbits: No acute finding. Other: None. IMPRESSION: No acute intracranial hemorrhage or calvarial fracture. Electronically Signed   By: Prentice Spade M.D.   On: 05/08/2024 09:11   DG Chest Port 1 View Result Date: 05/08/2024 EXAM: 1 VIEW(S) XRAY OF THE CHEST 05/08/2024 08:30:00 AM COMPARISON: PA and lateral radiographs of the chest dated 02/04/2016. CLINICAL HISTORY: Trauma. FINDINGS: LUNGS AND PLEURA: Mild-to-moderate elevation of the right  hemidiaphragm. No focal pulmonary opacity. No pleural effusion. No pneumothorax. HEART AND MEDIASTINUM: No acute abnormality of the cardiac and mediastinal silhouettes. BONES AND SOFT TISSUES: No acute osseous abnormality. IMPRESSION: 1. No acute process. 2. Mild-to-moderate elevation of the right hemidiaphragm. Electronically signed by: Evalene Coho MD 05/08/2024 08:35 AM EST RP Workstation: [id number]   DG Pelvis Portable Result Date: 05/08/2024 EXAM: 1 or 2 view(s) Xray of the pelvis 05/08/2024 08:30:00 AM COMPARISON: None available. CLINICAL HISTORY: Trauma Trauma FINDINGS: BONES AND JOINTS: No acute fracture. No focal osseous lesion. No joint dislocation. SOFT TISSUES: The soft tissues are unremarkable. IMPRESSION: 1. No significant abnormality. Electronically signed by: Evalene Coho MD 05/08/2024 08:34 AM EST RP Workstation: [id number]   DG Lumbar Spine 2-3 Views Result Date: 04/23/2024 CLINICAL DATA:  Elective surgery. EXAM: LUMBAR SPINE - 2-3 VIEW COMPARISON:  Preoperative imaging FINDINGS: Two fluoroscopic spot views of the lumbar spine submitted from the operating room. Pedicle screw fixation at L4 and L5 with interbody spacer. Fluoroscopy time 13.8 seconds. Dose 12.82 mGy. IMPRESSION: Intraoperative fluoroscopy during lumbar fusion. Electronically Signed   By: Andrea Gasman M.D.   On: 04/23/2024 19:36   DG Lumbar Spine 1 View Result Date: 04/23/2024 EXAM: 1 VIEW XRAY OF THE LUMBAR SPINE 04/23/2024 02:35:00 PM COMPARISON: Lumbar spine x-ray 01/25/1999. CLINICAL HISTORY: FINDINGS: LUMBAR SPINE: BONES: Instrument projects over the posterior elements at the L5-S1 level. No acute fracture. No aggressive appearing osseous lesion. Alignment is normal. DISCS AND DEGENERATIVE CHANGES: No severe degenerative changes. SOFT TISSUES: No acute abnormality. IMPRESSION: 1. No acute abnormality of the lumbar spine. 2. Instrument projects over the posterior elements at the L5-S1 level. Electronically  signed by: Greig Pique MD 04/23/2024 06:59 PM EDT RP Workstation: [id number]   DG C-Arm 1-60 Min-No Report Result Date: 04/23/2024 Fluoroscopy was utilized by the requesting physician.  No radiographic interpretation.   DG C-Arm 1-60 Min-No Report Result Date: 04/23/2024 Fluoroscopy was utilized by the requesting physician.  No radiographic interpretation.    Microbiology: Results for orders placed or performed during the hospital encounter of 04/21/24  Surgical pcr screen     Status: Abnormal   Collection Time: 04/21/24 11:40 AM   Specimen: Nasal Mucosa; Nasal Swab  Result Value Ref Range Status   MRSA, PCR NEGATIVE NEGATIVE Final   Staphylococcus aureus POSITIVE (A) NEGATIVE Final    Comment: (NOTE) The Xpert SA Assay (FDA approved for NASAL specimens in patients 71 years of age and older), is one component of a comprehensive surveillance program. It is not intended to diagnose infection nor to guide or monitor treatment. Performed at [facility], [address], [place], [place] [address]     Labs: CBC: Recent Labs  Lab 05/09/24 [id number] 05/10/24 0311 05/11/24 0811 05/14/24 0437 05/15/24 0442  WBC 6.7 6.2 6.0 21.1* 18.7*  NEUTROABS  --   --   --   --  14.9*  HGB 10.2* 10.7* 10.9* 11.8* 10.2*  HCT 29.4* 31.4* 31.4* 35.3* 30.9*  MCV 92.7 93.2 92.6 95.7 95.7  PLT 284 298 305 271 242  Basic Metabolic Panel: Recent Labs  Lab 05/09/24 0454 05/09/24 1457 05/10/24 0311 05/11/24 0811 05/14/24 0437 05/15/24 0442  NA 131*  --  131* 133* 137 132*  K 3.4*  --  3.8 3.9 4.1 3.5  CL 93*  --  95* 95* 98 96*  CO2 25  --  24 28 27 23   GLUCOSE 104*  --  113* 116* 122* 99  BUN 14  --  12 11 15 15   CREATININE 0.84  --  0.82 0.80 1.12* 0.88  CALCIUM  9.4  --  9.6 9.6 9.3 8.8*  MG  --  1.4* 2.5*  --   --   --    Liver Function Tests: Recent Labs  Lab 05/09/24 0454 05/11/24 0811 05/15/24 0442  AST 39 32 21  ALT 20 20 22   ALKPHOS 93 96 81  BILITOT 0.8 0.7 0.8   PROT 5.1* 5.3* 5.4*  ALBUMIN  2.3* 2.4* 2.3*   CBG: No results for input(s): GLUCAP in the last 168 hours.  Discharge time spent: 45 minutes  Signed: Duffy Larch, MD [facility] 05/16/2024

## 2024-05-16 NOTE — IPOC Note (Signed)
 Overall Plan of Care [facility]) Patient Details Name: SINA SUMPTER MRN: [id number] DOB: 01-Sep-1960  Admitting Diagnosis: Spinal stenosis of lumbar region  Hospital Problems: Active Problems:   S/P lumbar fusion     Functional Problem List: Nursing Bladder, Bowel, Edema, Endurance, Medication Management, Motor, Pain, Safety, Skin Integrity  PT Balance, Endurance, Pain, Safety, Sensory, Skin Integrity  OT Balance, Safety, Sensory, Skin Integrity, Endurance, Motor, Pain  SLP    TR         Basic ADL's: OT Grooming, Bathing, Dressing, Toileting     Advanced  ADL's: OT None     Transfers: PT Bed Mobility, Bed to Chair, Car, Occupational Psychologist, Research Scientist (life Sciences): PT Ambulation, Psychologist, Prison And Probation Services, Stairs     Additional Impairments: OT None  SLP        TR      Anticipated Outcomes Item Anticipated Outcome  Self Feeding mod I  Swallowing      Basic self-care  SBA  Toileting  SBA   Bathroom Transfers SBA  Bowel/Bladder  manage bowels with medications / manage bladder with toileting assistance  Transfers  supervision  Locomotion  supervision/mod I  Communication     Cognition     Pain  <4 w/ prns  Safety/Judgment  manage safety with minimal assistance   Therapy Plan: PT Intensity: Minimum of 1-2 x/day ,45 to 90 minutes PT Frequency: 5 out of 7 days PT Duration Estimated Length of Stay: 7-10 days OT Intensity: Minimum of 1-2 x/day, 45 to 90 minutes OT Frequency: 5 out of 7 days OT Duration/Estimated Length of Stay: 7-10 days     Team Interventions: Nursing Interventions Patient/Family Education, Skin Care/Wound Management, Bladder Management, Bowel Management, Disease Management/Prevention, Pain Management, Medication Management, Discharge Planning  PT interventions Ambulation/gait training, Discharge planning, Functional mobility training, Psychosocial support, Therapeutic Activities, Visual/perceptual remediation/compensation,  Balance/vestibular training, Disease management/prevention, Neuromuscular re-education, Skin care/wound management, Therapeutic Exercise, Wheelchair propulsion/positioning, UE/LE Strength taining/ROM, Splinting/orthotics, Pain management, DME/adaptive equipment instruction, Cognitive remediation/compensation, Community reintegration, Development worker, international aid stimulation, Patient/family education, Museum/gallery curator, UE/LE Coordination activities  OT Interventions Warden/ranger, Discharge planning, Functional electrical stimulation, Pain management, Self Care/advanced ADL retraining, Therapeutic Activities, UE/LE Coordination activities, Cognitive remediation/compensation, Functional mobility training, Disease mangement/prevention, Patient/family education, Skin care/wound managment, Therapeutic Exercise, Community reintegration, Fish Farm Manager, Neuromuscular re-education, Splinting/orthotics, Psychosocial support, UE/LE Strength taining/ROM, Wheelchair propulsion/positioning  SLP Interventions    TR Interventions    SW/CM Interventions Discharge Planning, Psychosocial Support, Patient/Family Education, Disease Management/Prevention   Barriers to Discharge MD  Medical stability and Wound care  Nursing Decreased caregiver support, Home environment access/layout Discharge: House  Discharge Home Layout: Two level, Able to live on main level with bedroom/bathroom  Alternate Level Stairs-Number of Steps: flight  Discharge Home Access: Stairs to enter  Entrance Stairs-Rails: Right, Left, Can reach both  Entrance Stairs-Number of Steps: 2 + 2 large threshold  PT None    OT None    SLP      SW       Team Discharge Planning: Destination: PT-Home ,OT- Home , SLP-  Projected Follow-up: PT-Outpatient PT, OT-  Outpatient OT, SLP-  Projected Equipment Needs: PT-To be determined, OT- None recommended by OT, SLP-  Equipment Details: PT-Pt currently has RW, shower seat, tub/shower  grab bars, and SPC, OT-owns all OT equipment Patient/family involved in discharge planning: PT- Patient, Family adult nurse,  OT-Patient, Family member/caregiver, SLP-   MD ELOS: 7-10 Medical Rehab Prognosis:  Excellent Assessment: The patient has  been admitted for CIR therapies with the diagnosis of L4-L5 PLIF 04/23/2024 by Dr. Mavis due to spinal stenosis, lumbar radiculopathy and  neurogenic claudication complicated by dehydration/acute metabolic encephalopathy. She had likely nondisplaced fracture of the left L4 transverse process after a fall  . The team will be addressing functional mobility, strength, stamina, balance, safety, adaptive techniques and equipment, self-care, bowel and bladder mgt, patient and caregiver education. Goals have been set at sup. Anticipated discharge destination is home.        See Team Conference Notes for weekly updates to the plan of care

## 2024-05-16 NOTE — Progress Notes (Signed)
 Inpatient Rehabilitation Care Coordinator Assessment and Plan Patient Details  Name: Jenny Yates MRN: [id number] Date of Birth: 01/02/1961  Today's Date: 05/16/2024  Hospital Problems: Active Problems:   S/P lumbar fusion  Past Medical History:  Past Medical History:  Diagnosis Date   Anemia    Anxiety    Collagenous colitis    Depression    Hyperlipidemia    Hypertension    Hypothyroidism    Lumbar herniated disc    Panic disorder    Past Surgical History:  Past Surgical History:  Procedure Laterality Date   BACK SURGERY     2007   HERNIA REPAIR     TUBAL LIGATION     Social History:  reports that she has quit smoking. Her smoking use included cigarettes. She has never used smokeless tobacco. She reports that she does not currently use alcohol. She reports that she does not use drugs.  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 Family Dynamics: Great support from spouse  Social History Preferred language: English Religion: Non-Denominational Education: Charity Fundraiser - How often do you need to have someone help you when you read instructions, pamphlets, or other written material from your doctor or pharmacy?: Never Writes: Yes Employment Status: Retired   Abuse/Neglect Abuse/Neglect Assessment Can Be Completed: Yes Physical Abuse: Denies Verbal Abuse: Denies Sexual Abuse: Denies Exploitation of patient/patient's resources: Denies Self-Neglect: Denies  Patient response to: Social Isolation - How often do you feel lonely or isolated from those around you?: Never  Emotional Status [name] affect, behavior and adjustment status: Patient adjusting very well to therapy Recent Psychosocial Issues: None Psychiatric History:  None Substance Abuse History: None  Patient / Family Perceptions, Expectations & Goals Pt/Family understanding of illness & functional limitations: Patient/Family understanding of illness & functional limitations Premorbid pt/family roles/activities: Very active in the community, traveling Anticipated changes in roles/activities/participation: None mentioned Pt/family expectations/goals: Patient/family has very realistic expectations and goals  Community Resources Premorbid Home Care/DME Agencies: Other (Comment) (Cane, specialty walker, bed rail, grab bar, back brace, grabber) Transportation available at discharge: Yes Is the patient able to respond to transportation needs?: Yes In the past 12 months, has lack of transportation kept you from medical appointments or from getting medications?: No In the past 12 months, has lack of transportation kept you from meetings, work, or from getting things needed for daily living?: No  Discharge Planning Living Arrangements: Spouse/significant other Support Systems: Spouse/significant other Type of Residence: Private residence Insurance Resources: Media Planner (specify) (AETNA / AET[employer] ENGINEER, BUILDING SERVICES) Financial Resources: Other (Comment) (Retirement) Financial Screen Referred: Yes Living Expenses: Own Money Management: Patient, Spouse Does the patient have any problems obtaining your medications?: No Home Management: Both Patient/Family Preliminary Plans: Plans to return home with 24/7 support from spouse Care Coordinator Anticipated Follow Up Needs: HH/OP Expected length of stay: 7-10 days  Clinical Impression CSW met with patient/family to introduce herself and complete initial assessment. Patient is acclimated and able to make all needs known. Her spouse, [name] is also present. Patient presented to [facility] following S/P lumbar fusion. Jenny Yates lives with her husband of 38 years in a 2-level home - they do not utilize the upstairs for  living, only for storage. She has DME: [place], specialty walker, bed rail, grab bar, back brace and a grabber.Mr. Brys is very active in  care for his wife and is able to address all needs. They are both active in [facility] care and rehabilitation.  There were no further needs or concerns at present. CSW will follow up with family and continue to follow. Will provide patient/family with an update as soon as one becomes available.   Di'Asia  Loreli 05/16/2024, 10:54 AM

## 2024-05-16 NOTE — Progress Notes (Signed)
 Occupational Therapy Session Note  Patient Details  Name: Jenny Yates MRN: [id number] Date of Birth: 01/11/1961  Today's Date: 05/16/2024 OT Individual Time: 1102-1202 OT Individual Time Calculation (min): 60 min    Short Term Goals: Week 1:  OT Short Term Goal 1 (Week 1): STG=LTG d/t ELOS  Skilled Therapeutic Interventions/Progress Updates:  Patient agreeable to participate in OT session. Reports no pain level.   Patient participated in skilled OT session focusing on AE training, functional mobility. Patient received in wc with spouse. Patient in good spirits ready for OT session. Patient educated on use of AE for dressing to become more independent. Patient trialed reacher for LB dressing, dressing stick for LB dressing, and sock aide for footwear. Patient able to complete all with SU to CGA assist. Patient preferred reacher for LB dressing. Patient then completed functional mobility 200 + ft with CGA with RW with good balance  to increase ability to complete ADLs/ IADLs. Patient then given wrist weights due to tremors to determine if weighting will assist in eating efficiency. Patient set up in wc all needs in reach. OT to check in to determine how wrist weights helped      Therapy Documentation Precautions:  Precautions Precautions: Fall, Back Recall of Precautions/Restrictions: Intact Precaution/Restrictions Comments: able to recall 3/3 precautions Required Braces or Orthoses: Spinal Brace (when OOB) Spinal Brace: Lumbar corset, Applied in sitting position Restrictions Weight Bearing Restrictions Per Provider Order: No  Therapy/Group: Individual Therapy  D'mariea L Hari Casaus 05/16/2024, 7:29 AM

## 2024-05-16 NOTE — Plan of Care (Signed)
  Problem: Consults Goal: RH GENERAL PATIENT EDUCATION Description: See Patient Education module for education specifics. Outcome: Progressing   Problem: RH BOWEL ELIMINATION Goal: RH STG MANAGE BOWEL WITH ASSISTANCE Description: STG Manage Bowel with min Assistance. Outcome: Progressing   Problem: RH BLADDER ELIMINATION Goal: RH STG MANAGE BLADDER WITH ASSISTANCE Description: STG Manage Bladder With min Assistance Outcome: Progressing   Problem: RH SKIN INTEGRITY Goal: RH STG SKIN FREE OF INFECTION/BREAKDOWN Description: Manage skin free of infection with min assistance Outcome: Progressing   Problem: RH SAFETY Goal: RH STG ADHERE TO SAFETY PRECAUTIONS W/ASSISTANCE/DEVICE Description: STG Adhere to Safety Precautions With min Assistance/Device. Outcome: Progressing   Problem: RH PAIN MANAGEMENT Goal: RH STG PAIN MANAGED AT OR BELOW [name] PAIN GOAL Description: <4 / prns Outcome: Progressing   Problem: RH KNOWLEDGE DEFICIT GENERAL Goal: RH STG INCREASE KNOWLEDGE OF SELF CARE AFTER HOSPITALIZATION Description: Manage increase knowledge of self care after hospitalization with min assistance from husband using educational materials provided Outcome: Progressing

## 2024-05-17 DIAGNOSIS — K5901 Slow transit constipation: Secondary | ICD-10-CM

## 2024-05-17 DIAGNOSIS — M5416 Radiculopathy, lumbar region: Secondary | ICD-10-CM

## 2024-05-17 DIAGNOSIS — M48061 Spinal stenosis, lumbar region without neurogenic claudication: Secondary | ICD-10-CM

## 2024-05-17 LAB — URINE CULTURE: Culture: >=100000 — AB

## 2024-05-17 MED ORDER — PROSOURCE PLUS PO LIQD
30.0000 mL | Freq: Two times a day (BID) | ORAL | Status: DC
  Administered 2024-05-17 – 2024-05-20 (×7): 30 mL via ORAL
  Filled 2024-05-17 (×7): qty 30

## 2024-05-17 NOTE — Progress Notes (Signed)
 Physical Therapy Session Note  Patient Details  Name: Jenny Yates MRN: [id number] Date of Birth: 12/19/1960  Today's Date: 05/17/2024 PT Individual Time: 0800-0902, 8499-8465 PT Individual Time Calculation (min): 62 min, 34 min   Short Term Goals: Week 1:  PT Short Term Goal 1 (Week 1): Pt will perform supine <> sit transfers with min A PT Short Term Goal 2 (Week 1): Pt will perform stand pivot transfers with supervision PT Short Term Goal 3 (Week 1): Pt will perform car transfer with min A  Skilled Therapeutic Interventions/Progress Updates:     Treatment 1: Pt supine in bed upon arrival. Pt reports 2/10 LBP and agreeable to therapy. Session emphasized functional strengthening and endurance with transfers and stair negotiation. Pt performed supine to sit via log roll from flat bed with min A. Pt performed UB dressing and PT assisted with LB dressing. Pt donned LSO brace in seated position with set up assist to place brace. Pt performed short distance ambulatory transfer to toilet with SBA. Pt continent of bowel and required assist for perihygiene. Pt performed clothing management with CGA/SBA. Pt balanced at sink to wash her hands and brush her teeth with single UE support on sink with supervision. Pt ambulated ~180 ft room to main gym using RW with CGA progressing to SBA. Pt asc/desc 12-6 inch steps using B HR with min A progressing to CGA using reciprocal and step-to patterns. Pt asc/desc 4-6 inch steps using single HR with light min A using step-to pattern. Pt asc/desc 1-6 inch step without UE support with mod A and report of feeling very unsteady. Pt negotiated stepping over 3-6 inch hurdles x4 trials using RW with CGA, no LOB noted. Once back in room, pt transferred to recliner from [facility] with CGA/SBA. Pt remained seated in recliner, seat alarm set, and all needs within reach at end of session.  Treatment 2: Pt seated in WC upon arrival. Pt reports 2.5/10 LBP and agreeable to therapy.  Session emphasized functional strengthening, endurance, and independence with transfers and bed mobility. Pt practiced sitting to side of bed using 4 in step (pt has a 4.5 in step at home). Pt performed x2 trials of the following sequence - backwards step onto step from RW followed by sitting EOB with CGA/SBA, followed by log rolling to supine with CGA first trial and close supervision and ++ time second trial. Upon return to room, pt transferred WC to EOB with close supervision using bed rail, followed by returning to supine with supervision. Bed alarm set and all needs within reach at end of session.  Therapy Documentation Precautions:  Precautions Precautions: Fall, Back Recall of Precautions/Restrictions: Intact Precaution/Restrictions Comments: able to recall 3/3 precautions Required Braces or Orthoses: Spinal Brace (when OOB) Spinal Brace: Lumbar corset, Applied in sitting position Restrictions Weight Bearing Restrictions Per Provider Order: No  Therapy/Group: Individual Therapy  Comer CHRISTELLA Levora Comer Levora, PT, DPT 05/17/2024, 8:00 AM

## 2024-05-17 NOTE — Progress Notes (Signed)
 Occupational Therapy Session Note  Patient Details  Name: Jenny Yates MRN: [id number] Date of Birth: 05/30/1961  Session 1: Today's Date: 05/17/2024 OT Individual Time: 8982-8953 OT Individual Time Calculation (min): 29 min    Session 2 Today's Date: 05/17/2024 OT Individual Time: 8699-8584 OT Individual Time Calculation (min): 75 min     Short Term Goals: Week 1:  OT Short Term Goal 1 (Week 1): STG=LTG d/t ELOS   Session 1: Skilled Therapeutic Interventions/Progress Updates:   Patient agreeable to participate in OT session. Reports no pain level.   Patient participated in skilled OT session focusing on functional mobility and UE strengthening.  Patient received in recliner. Patient able to complete functional mobility 160 ft to gym with RW. Patient sat on Edge of mat table to complete UE strengthening. Completed circuit for UE of horizontal abduction, elbow flexion/ extension with Emilygrace Grothe theraband 3x10-12. Completed push press with 3 pound dowel 2x10. Patient then returned to room via functional mobility, left with alarm on all needs in reach.    Session 2: Skilled Therapeutic Interventions/Progress Updates:   Patient agreeable to participate in OT session. Reports no pain level. Patient participated in skilled OT session focusing on functional mobility, activity tolerance, and car transfers. Patient received in recliner finishing lunch. OT observed to determine functional use of UE due to tremors. Patient able to complete functional mobility to gym. Patient completed NuSTEP 20 minutes on level 4 40-55 spm pacing. Patient able to complete with good skill and activity tolerance. Patient then completed car transfer with trial of several step heights to determine ability to complete transfers. Patient returned to room via wc for timing. Passed off to NT for toileting.    Therapy Documentation Precautions:  Precautions Precautions: Fall, Back Recall of Precautions/Restrictions:  Intact Precaution/Restrictions Comments: able to recall 3/3 precautions Required Braces or Orthoses: Spinal Brace (when OOB) Spinal Brace: Lumbar corset, Applied in sitting position Restrictions Weight Bearing Restrictions Per Provider Order: No  Therapy/Group: Individual Therapy  D'mariea L Chaka Jefferys 05/17/2024, 7:14 AM

## 2024-05-17 NOTE — Progress Notes (Signed)
 PROGRESS NOTE   Subjective/Complaints:  Discussed bowels with pt and husband (who is RN)  Appetite is good, feels like she has a cork stopping her bowels   ROS:no abd pain , no N/V/D + Constipation  Objective:   DG Abd 1 View Result Date: 05/15/2024 CLINICAL DATA:  Constipation. EXAM: ABDOMEN - 1 VIEW COMPARISON:  None Available. FINDINGS: Mild colonic stool burden. No bowel dilatation or evidence of obstruction. No free air or radiopaque calculi. Right upper quadrant cholecystectomy clips. Lower lumbar spinal fusion hardware. No acute osseous pathology. IMPRESSION: Mild colonic stool burden. No bowel obstruction. Electronically Signed   By: Vanetta Chou M.D.   On: 05/15/2024 19:48   Recent Labs    05/15/24 0442 05/16/24 0612  WBC 18.7* 14.2*  HGB 10.2* 10.6*  HCT 30.9* 31.6*  PLT 242 253   Recent Labs    05/15/24 0442 05/16/24 0612  NA 132* 134*  K 3.5 3.5  CL 96* 98  CO2 23 23  GLUCOSE 99 178*  BUN 15 22  CREATININE 0.88 0.82  CALCIUM  8.8* 8.7*    Intake/Output Summary (Last 24 hours) at 05/17/2024 1025 Last data filed at 05/16/2024 1900 Gross per 24 hour  Intake 480 ml  Output --  Net 480 ml        Physical Exam: Vital Signs Blood pressure (!) 160/82, pulse 73, temperature 98.4 F (36.9 C), temperature source Oral, resp. rate 16, height 4' 11 (1.499 m), weight 91.7 kg, SpO2 94%.   General: No acute distress Mood and affect are appropriate Heart: Regular rate and rhythm no rubs murmurs or extra sounds Lungs: Clear to auscultation, breathing unlabored, no rales or wheezes Abdomen: Positive bowel sounds, soft nontender to palpation, nondistended Extremities: No clubbing, cyanosis, or edema Skin: No evidence of breakdown, no evidence of rash    neuro: Alert and awake, responses little delayed, fluent, follows commands, cranial nerves II through XII grossly intact,      MOTOR: RUE: 4-/5  Deltoid, 4/5 Biceps, 4/5 Triceps,4/5 Grip LUE: 4-/5 Deltoid, 4/5 Biceps, 4/5 Triceps, 4/5 Grip RLE: HF 3/5, KE 4/5, ADF 4/5, APF 4/5 LLE: HF 3/5, KE 4/5, ADF 4/5, APF 4/5    SENSORY: Normal to touch all 4 extremities   MSK: no joint swelling  Prior neuro assessment is c/w today's exam 05/17/2024.  ADDENDUM photo 05/16/24      Assessment/Plan: 1. Functional deficits which require 3+ hours per day of interdisciplinary therapy in a comprehensive inpatient rehab setting. Physiatrist is providing close team supervision and 24 hour management of active medical problems listed below. Physiatrist and rehab team continue to assess barriers to discharge/monitor patient progress toward functional and medical goals  Care Tool:  Bathing              Bathing assist Assist Level: Minimal Assistance - Patient > 75%     Upper Body Dressing/Undressing Upper body dressing        Upper body assist Assist Level: Contact Guard/Touching assist    Lower Body Dressing/Undressing Lower body dressing            Lower body assist Assist for lower body dressing: Moderate Assistance - Patient  50 - 74%     Toileting Toileting    Toileting assist Assist for toileting: Minimal Assistance - Patient > 75%     Transfers Chair/bed transfer  Transfers assist     Chair/bed transfer assist level: Contact Guard/Touching assist     Locomotion Ambulation   Ambulation assist      Assist level: Contact Guard/Touching assist Assistive device: Walker-rolling Max distance: 150   Walk 10 feet activity   Assist     Assist level: Contact Guard/Touching assist Assistive device: Walker-rolling   Walk 50 feet activity   Assist    Assist level: Contact Guard/Touching assist Assistive device: Walker-rolling    Walk 150 feet activity   Assist    Assist level: Contact Guard/Touching assist Assistive device: Walker-rolling    Walk 10 feet on uneven surface   activity   Assist     Assist level: Contact Guard/Touching assist Assistive device: Walker-rolling   Wheelchair     Assist Is the patient using a wheelchair?: Yes Type of Wheelchair: Manual    Wheelchair assist level: Moderate Assistance - Patient 50 - 74% Max wheelchair distance: 5 ft    Wheelchair 50 feet with 2 turns activity    Assist    Wheelchair 50 feet with 2 turns activity did not occur:  (weakness/fatigue/pain)   Assist Level: Total Assistance - Patient < 25%   Wheelchair 150 feet activity     Assist  Wheelchair 150 feet activity did not occur:  (weakness/fatigue/pain)   Assist Level: Total Assistance - Patient < 25%   Blood pressure (!) 160/82, pulse 73, temperature 98.4 F (36.9 C), temperature source Oral, resp. rate 16, height 4' 11 (1.499 m), weight 91.7 kg, SpO2 94%.  Medical Problem List and Plan: 1. Functional deficits secondary to history of L4-L5 PLIF 04/23/2024 by Dr. Mavis due to spinal stenosis, lumbar radiculopathy and  neurogenic claudication complicated by dehydration/acute metabolic encephalopathy. She had likely nondisplaced fracture of the left L4 transverse process after a fall              -patient may shower-cover incision please              -ELOS/Goals: 7-10 days, sup PT, Sup to min A OT             - Continue CIR 2.  Antithrombotics: -DVT/anticoagulation:  Mechanical: Antiembolism stockings, thigh (TED hose) Bilateral lower extremities             -antiplatelet therapy: N/A 3. Pain Management: Celebrex  200 mg daily, oxycodone  5 mg every 8 hours as needed moderate pain 4. Mood/Behavior/Sleep/anxiety/panic attacks.  Xanax  0.25 mg nightly as needed             -antipsychotic agents: N/A 5. Neuropsych/cognition: This patient is capable of making decisions on her own behalf. 6. Skin/Wound Care: Routine skin check 7. Fluids/Electrolytes/Nutrition: Routine in and outs with follow-up chemistries  - 11/20 check prealbumin  tomorrow, continue protein supplements 8.  AKI/hyponatremia/hypotension.  Patient initially with ProAmatine  since discontinued.  Prior to admission patient on HCTZ 25 mg daily, hydralazine  50 mg twice daily, Bystolic  20 mg daily, Benicar  20 mg twice daily.  Monitor with increased mobility and resume as needed                Latest Ref Rng & Units 05/16/2024    6:12 AM 05/15/2024    4:42 AM 05/14/2024    4:37 AM  BMP  Glucose 70 - 99 mg/dL 821  99  122   BUN 8 - 23 mg/dL 22  15  15    Creatinine 0.44 - 1.00 mg/dL 9.17  9.11  8.87   Sodium 135 - 145 mmol/L 134  132  137   Potassium 3.5 - 5.1 mmol/L 3.5  3.5  4.1   Chloride 98 - 111 mmol/L 98  96  98   CO2 22 - 32 mmol/L 23  23  27    Calcium  8.9 - 10.3 mg/dL 8.7  8.8  9.3     9.  Hyperlipidemia.  Crestor  10.  Hypothyroidism.  Synthroid  11.  B12 deficiency.  Follow-up outpatient 12.  13 x 6 mm ground glass nodule right lung apex.  Follow-up outpatient 6 to 12 months chest CT. 13.  Class III obesity.  BMI 40.30.  Dietary follow-up 14.  History of alcohol and tobacco use.  Provide counseling 15. Leukocytosis          Improving on abx for UTI >100K Kleb S to keflex  so cefadroxil  should be S as well  16.  Collagenous colitis  - On Entocort (budesonide )  - Patient reporting some mild constipation, will add some Colace.  Being cautious with laxatives to avoid causing much diarrhea.  Will check x-ray regarding stool burden  - 11/21 decrease budesonide  to 6 mg, discussed MiraLAX  as needed.  Will try to avoid stimulant laxatives  plans to take miralax  this am , discussed bisacodyl  supp this pm if no BM 17. UTI  - 11/21 Duricef started monitor cultures  LOS: 3 days A FACE TO FACE EVALUATION WAS PERFORMED  Prentice FORBES Compton 05/17/2024, 10:25 AM

## 2024-05-17 NOTE — Plan of Care (Signed)
  Problem: Consults Goal: RH GENERAL PATIENT EDUCATION Description: See Patient Education module for education specifics. Outcome: Progressing   Problem: RH BOWEL ELIMINATION Goal: RH STG MANAGE BOWEL WITH ASSISTANCE Description: STG Manage Bowel with min Assistance. Outcome: Progressing   Problem: RH BLADDER ELIMINATION Goal: RH STG MANAGE BLADDER WITH ASSISTANCE Description: STG Manage Bladder With min Assistance Outcome: Progressing   Problem: RH SKIN INTEGRITY Goal: RH STG SKIN FREE OF INFECTION/BREAKDOWN Description: Manage skin free of infection with min assistance Outcome: Progressing   Problem: RH SAFETY Goal: RH STG ADHERE TO SAFETY PRECAUTIONS W/ASSISTANCE/DEVICE Description: STG Adhere to Safety Precautions With min Assistance/Device. Outcome: Progressing   Problem: RH PAIN MANAGEMENT Goal: RH STG PAIN MANAGED AT OR BELOW [name] PAIN GOAL Description: <4 / prns Outcome: Progressing   Problem: RH KNOWLEDGE DEFICIT GENERAL Goal: RH STG INCREASE KNOWLEDGE OF SELF CARE AFTER HOSPITALIZATION Description: Manage increase knowledge of self care after hospitalization with min assistance from husband using educational materials provided Outcome: Progressing

## 2024-05-17 NOTE — Progress Notes (Signed)
 Initial Nutrition Assessment  DOCUMENTATION CODES:   Morbid obesity  INTERVENTION:  Continue protein supplements, recommend: Boost Plus po TID, each supplement provides 360 kcal and 14 grams of protein ProSource Plus 30 ml BID  Encourage adequate intake at meals to help meet calorie and protein needs for wound healing  Added General, Healthy Nutrition Therapy handout from the [employer] to AVS  NUTRITION DIAGNOSIS:   Inadequate protein intake related to decreased appetite as evidenced by per patient/family report.  GOAL:   Patient will meet greater than or equal to 90% of their needs  MONITOR:   PO intake, Supplement acceptance  REASON FOR ASSESSMENT:   Consult Assessment of nutrition requirement/status, Poor PO, Diet education  ASSESSMENT:   Pt with hx of HTN, anxiety/depression, panic disorders, tobacco/alcohol use, chronic back pain a/p L4-5 fusion, panic disorders. Recent admission after mechanical fall with AMS and hypotension. AMS likely related to polypharmacy dehydration and AKI. Admitted to CIR for decreased functional mobility.  RD consulted for low prealbumin levels. While prealbumin may be used as an acute marker of nutritional status, it is also an acute phase reactant so may be less accurate in the setting of inflammation. RD working remotely at time of assessment and unable to conduct nutrition focused physical exam which is another indicator for nutrition status. Unable to obtain hx from pt at time of assessment, but MD note reports poor PO intake ongoing PTA. Since admission to CIR, meal completion averaging around 79% over last 5 recorded meals. Intake appears adequate at this time, but would recommend continued protein supplementation to support wound healing. Discontinued Boost Breeze as it provides higher calories and less protein per serving than Boost Plus. Continue Boost Plus and add ProSource Plus to help encourage increased  protein intake. Would recommend liberalizing diet if pt's PO intake decreases.  No recent wt changes noted in chart review. Pt reported to use walker at baseline for mobility following recent back surgery.  Recommend following general, healthy nutrition therapy plan which is attached in AVS and will be discussed at follow up.  NUTRITION - FOCUSED PHYSICAL EXAM:  Deferred to follow up  Diet Order:   Diet Order             Diet Heart Room service appropriate? Yes; Fluid consistency: Thin  Diet effective now                   EDUCATION NEEDS:   Not appropriate for education at this time  Skin:  Skin Assessment: Skin Integrity Issues: Skin Integrity Issues:: Incisions, Other (Comment) Incisions: back Other: traumatic injury R elbow + L knee  Last BM:  11/20 type 6  Height:   Ht Readings from Last 1 Encounters:  05/14/24 4' 11 (1.499 m)    Weight:   Wt Readings from Last 1 Encounters:  05/16/24 91.7 kg    Ideal Body Weight:  44.7 kg  BMI:  Body mass index is 40.83 kg/m.  Estimated Nutritional Needs:   Kcal:  1700-2000  Protein:  100-120g  Fluid:  1.7-2L    Josette Glance, MS, RDN, LDN Clinical Dietitian I Please reach out via secure chat

## 2024-05-18 DIAGNOSIS — M5416 Radiculopathy, lumbar region: Secondary | ICD-10-CM | POA: Diagnosis not present

## 2024-05-18 DIAGNOSIS — M48061 Spinal stenosis, lumbar region without neurogenic claudication: Secondary | ICD-10-CM | POA: Diagnosis not present

## 2024-05-18 DIAGNOSIS — K5901 Slow transit constipation: Secondary | ICD-10-CM | POA: Diagnosis not present

## 2024-05-18 MED ORDER — IRBESARTAN 75 MG PO TABS
75.0000 mg | ORAL_TABLET | Freq: Every day | ORAL | Status: DC
  Administered 2024-05-18 – 2024-05-21 (×4): 75 mg via ORAL
  Filled 2024-05-18 (×5): qty 1

## 2024-05-18 NOTE — Progress Notes (Signed)
 Met with patient and husband to review current situation, team conference and plan of care. Reviewed medications, pain / bowel /  bladder and sleep. Reviewed importance of increasing water intake. Reviewed incision care. Continue to follow along to provide educational needs to facilitate preparation for discharge.

## 2024-05-18 NOTE — Plan of Care (Signed)
  Problem: Consults Goal: RH GENERAL PATIENT EDUCATION Description: See Patient Education module for education specifics. Outcome: Progressing   Problem: RH BOWEL ELIMINATION Goal: RH STG MANAGE BOWEL WITH ASSISTANCE Description: STG Manage Bowel with min Assistance. Outcome: Progressing   Problem: RH BLADDER ELIMINATION Goal: RH STG MANAGE BLADDER WITH ASSISTANCE Description: STG Manage Bladder With min Assistance Outcome: Progressing   Problem: RH SKIN INTEGRITY Goal: RH STG SKIN FREE OF INFECTION/BREAKDOWN Description: Manage skin free of infection with min assistance Outcome: Progressing   Problem: RH SAFETY Goal: RH STG ADHERE TO SAFETY PRECAUTIONS W/ASSISTANCE/DEVICE Description: STG Adhere to Safety Precautions With min Assistance/Device. Outcome: Progressing   Problem: RH PAIN MANAGEMENT Goal: RH STG PAIN MANAGED AT OR BELOW [name] PAIN GOAL Description: <4 / prns Outcome: Progressing   Problem: RH KNOWLEDGE DEFICIT GENERAL Goal: RH STG INCREASE KNOWLEDGE OF SELF CARE AFTER HOSPITALIZATION Description: Manage increase knowledge of self care after hospitalization with min assistance from husband using educational materials provided Outcome: Progressing

## 2024-05-18 NOTE — Progress Notes (Signed)
 Occupational Therapy Session Note  Patient Details  Name: Jenny Yates MRN: [id number] Date of Birth: [birth year]  {CHL IP REHAB OT TIME CALCULATIONS:[id number]}   Short Term Goals: Week 1:  OT Short Term Goal 1 (Week 1): STG=LTG d/t ELOS  Skilled Therapeutic Interventions/Progress Updates:    Patient agreeable to participate in OT session. Reports *** pain level.   Patient participated in skilled OT session focusing on ***. Therapist facilitated/assessed/developed/educated/integrated/elicited *** in order to improve/facilitate/promote    Therapy Documentation Precautions:  Precautions Precautions: Fall, Back Recall of Precautions/Restrictions: Intact Precaution/Restrictions Comments: able to recall 3/3 precautions Required Braces or Orthoses: Spinal Brace (when OOB) Spinal Brace: Lumbar corset, Applied in sitting position Restrictions Weight Bearing Restrictions Per Provider Order: No  Therapy/Group: Individual Therapy  Leita Howell, OTR/L,CBIS  Supplemental OT - [facility] and [facility] Secure Chat Preferred   05/18/2024, 10:13 PM

## 2024-05-18 NOTE — Progress Notes (Signed)
 PROGRESS NOTE   Subjective/Complaints:  Bowels moved without laxatives yesterday   ROS:no abd pain , no N/V/D + Constipation  Objective:   No results found.  Recent Labs    05/16/24 0612  WBC 14.2*  HGB 10.6*  HCT 31.6*  PLT 253   Recent Labs    05/16/24 0612  NA 134*  K 3.5  CL 98  CO2 23  GLUCOSE 178*  BUN 22  CREATININE 0.82  CALCIUM  8.7*    Intake/Output Summary (Last 24 hours) at 05/18/2024 0955 Last data filed at 05/18/2024 0740 Gross per 24 hour  Intake 712 ml  Output --  Net 712 ml        Physical Exam: Vital Signs Blood pressure (!) 161/87, pulse 78, temperature 98.3 F (36.8 C), temperature source Oral, resp. rate 16, height 4' 11 (1.499 m), weight 91.7 kg, SpO2 96%.   General: No acute distress Mood and affect are appropriate Heart: Regular rate and rhythm no rubs murmurs or extra sounds Lungs: Clear to auscultation, breathing unlabored, no rales or wheezes Abdomen: Positive bowel sounds, soft nontender to palpation, nondistended Extremities: No clubbing, cyanosis, or edema Skin: No evidence of breakdown, no evidence of rash   neuro: Alert and awake, responses little delayed, fluent, follows commands, cranial nerves II through XII grossly intact,      MOTOR: RUE: 4-/5 Deltoid, 4/5 Biceps, 4/5 Triceps,4/5 Grip LUE: 4-/5 Deltoid, 4/5 Biceps, 4/5 Triceps, 4/5 Grip RLE: HF 3/5, KE 4/5, ADF 4/5, APF 4/5 LLE: HF 3/5, KE 4/5, ADF 4/5, APF 4/5    SENSORY: Normal to touch all 4 extremities   MSK: no joint swelling  Prior neuro assessment is c/w today's exam 05/18/2024.  ADDENDUM photo 05/16/24- looks the same 11/23      Assessment/Plan: 1. Functional deficits which require 3+ hours per day of interdisciplinary therapy in a comprehensive inpatient rehab setting. Physiatrist is providing close team supervision and 24 hour management of active medical problems listed  below. Physiatrist and rehab team continue to assess barriers to discharge/monitor patient progress toward functional and medical goals  Care Tool:  Bathing              Bathing assist Assist Level: Minimal Assistance - Patient > 75%     Upper Body Dressing/Undressing Upper body dressing        Upper body assist Assist Level: Contact Guard/Touching assist    Lower Body Dressing/Undressing Lower body dressing            Lower body assist Assist for lower body dressing: Moderate Assistance - Patient 50 - 74%     Toileting Toileting    Toileting assist Assist for toileting: Minimal Assistance - Patient > 75%     Transfers Chair/bed transfer  Transfers assist     Chair/bed transfer assist level: Contact Guard/Touching assist     Locomotion Ambulation   Ambulation assist      Assist level: Contact Guard/Touching assist Assistive device: Walker-rolling Max distance: 150   Walk 10 feet activity   Assist     Assist level: Contact Guard/Touching assist Assistive device: Walker-rolling   Walk 50 feet activity   Assist  Assist level: Contact Guard/Touching assist Assistive device: Walker-rolling    Walk 150 feet activity   Assist    Assist level: Contact Guard/Touching assist Assistive device: Walker-rolling    Walk 10 feet on uneven surface  activity   Assist     Assist level: Contact Guard/Touching assist Assistive device: Walker-rolling   Wheelchair     Assist Is the patient using a wheelchair?: Yes Type of Wheelchair: Manual    Wheelchair assist level: Moderate Assistance - Patient 50 - 74% Max wheelchair distance: 5 ft    Wheelchair 50 feet with 2 turns activity    Assist    Wheelchair 50 feet with 2 turns activity did not occur:  (weakness/fatigue/pain)   Assist Level: Total Assistance - Patient < 25%   Wheelchair 150 feet activity     Assist  Wheelchair 150 feet activity did not occur:   (weakness/fatigue/pain)   Assist Level: Total Assistance - Patient < 25%   Blood pressure (!) 161/87, pulse 78, temperature 98.3 F (36.8 C), temperature source Oral, resp. rate 16, height 4' 11 (1.499 m), weight 91.7 kg, SpO2 96%.  Medical Problem List and Plan: 1. Functional deficits secondary to history of L4-L5 PLIF 04/23/2024 by Dr. Mavis due to spinal stenosis, lumbar radiculopathy and  neurogenic claudication complicated by dehydration/acute metabolic encephalopathy. She had likely nondisplaced fracture of the left L4 transverse process after a fall              -patient may shower-cover incision please              -ELOS/Goals: 7-10 days, sup PT, Sup to min A OT             - Continue CIR 2.  Antithrombotics: -DVT/anticoagulation:  Mechanical: Antiembolism stockings, thigh (TED hose) Bilateral lower extremities             -antiplatelet therapy: N/A 3. Pain Management: Celebrex  200 mg daily, oxycodone  5 mg every 8 hours as needed moderate pain 4. Mood/Behavior/Sleep/anxiety/panic attacks.  Xanax  0.25 mg nightly as needed             -antipsychotic agents: N/A 5. Neuropsych/cognition: This patient is capable of making decisions on her own behalf. 6. Skin/Wound Care: Routine skin check 7. Fluids/Electrolytes/Nutrition: Routine in and outs with follow-up chemistries  - 11/20 check prealbumin tomorrow, continue protein supplements 8.  AKI/hyponatremia/hypotension.  Patient initially with ProAmatine  since discontinued.  Prior to admission patient on HCTZ 25 mg daily, hydralazine  50 mg twice daily, Bystolic  20 mg daily, Benicar  20 mg twice daily.  Monitor with increased mobility and resume as needed                Latest Ref Rng & Units 05/16/2024    6:12 AM 05/15/2024    4:42 AM 05/14/2024    4:37 AM  BMP  Glucose 70 - 99 mg/dL 821  99  877   BUN 8 - 23 mg/dL 22  15  15    Creatinine 0.44 - 1.00 mg/dL 9.17  9.11  8.87   Sodium 135 - 145 mmol/L 134  132  137   Potassium 3.5 -  5.1 mmol/L 3.5  3.5  4.1   Chloride 98 - 111 mmol/L 98  96  98   CO2 22 - 32 mmol/L 23  23  27    Calcium  8.9 - 10.3 mg/dL 8.7  8.8  9.3     9.  Hyperlipidemia.  Crestor  10.  Hypothyroidism.  Synthroid  11.  B12  deficiency.  Follow-up outpatient 12.  13 x 6 mm ground glass nodule right lung apex.  Follow-up outpatient 6 to 12 months chest CT. 13.  Class III obesity.  BMI 40.30.  Dietary follow-up 14.  History of alcohol and tobacco use.  Provide counseling 15. Leukocytosis          Improving on abx for UTI >100K Kleb S to keflex  so cefadroxil  should be S as well  16.  Collagenous colitis  - On Entocort (budesonide )  - Patient reporting some mild constipation, will add some Colace.  Being cautious with laxatives to avoid causing much diarrhea.  Will check x-ray regarding stool burden  - 11/21 decrease budesonide  to 6 mg, discussed MiraLAX  as needed.  Will try to avoid stimulant laxatives  plans to take miralax  this am , discussed bisacodyl  supp this pm if no BM 17. UTI  - 11/21 Duricef started monitor cultures  LOS: 4 days A FACE TO FACE EVALUATION WAS PERFORMED  Jenny Yates 05/18/2024, 9:55 AM

## 2024-05-19 ENCOUNTER — Other Ambulatory Visit (HOSPITAL_COMMUNITY): Payer: Self-pay

## 2024-05-19 DIAGNOSIS — M4802 Spinal stenosis, cervical region: Secondary | ICD-10-CM | POA: Insufficient documentation

## 2024-05-19 DIAGNOSIS — K52831 Collagenous colitis: Secondary | ICD-10-CM | POA: Diagnosis not present

## 2024-05-19 DIAGNOSIS — D72829 Elevated white blood cell count, unspecified: Secondary | ICD-10-CM | POA: Diagnosis not present

## 2024-05-19 DIAGNOSIS — N179 Acute kidney failure, unspecified: Secondary | ICD-10-CM | POA: Diagnosis not present

## 2024-05-19 DIAGNOSIS — M48061 Spinal stenosis, lumbar region without neurogenic claudication: Secondary | ICD-10-CM | POA: Diagnosis not present

## 2024-05-19 LAB — BASIC METABOLIC PANEL WITH GFR
Anion gap: 11 (ref 5–15)
BUN: 17 mg/dL (ref 8–23)
CO2: 27 mmol/L (ref 22–32)
Calcium: 8.9 mg/dL (ref 8.9–10.3)
Chloride: 100 mmol/L (ref 98–111)
Creatinine, Ser: 0.76 mg/dL (ref 0.44–1.00)
GFR, Estimated: >60 mL/min (ref >60)
Glucose, Bld: 111 mg/dL — ABNORMAL HIGH (ref 70–99)
Potassium: 3.5 mmol/L (ref 3.5–5.1)
Sodium: 138 mmol/L (ref 135–145)

## 2024-05-19 LAB — CBC
HCT: 33.4 % — ABNORMAL LOW (ref 36.0–46.0)
Hemoglobin: 10.8 g/dL — ABNORMAL LOW (ref 12.0–15.0)
MCH: 30.9 pg (ref 26.0–34.0)
MCHC: 32.3 g/dL (ref 30.0–36.0)
MCV: 95.7 fL (ref 80.0–100.0)
Platelets: 289 K/uL (ref 150–400)
RBC: 3.49 MIL/uL — ABNORMAL LOW (ref 3.87–5.11)
RDW: 13.2 % (ref 11.5–15.5)
WBC: 9.8 K/uL (ref 4.0–10.5)
nRBC: 0 % (ref 0.0–0.2)

## 2024-05-19 MED ORDER — CYCLOBENZAPRINE HCL 5 MG PO TABS
5.0000 mg | ORAL_TABLET | Freq: Three times a day (TID) | ORAL | 0 refills | Status: DC | PRN
  Filled 2024-05-19: qty 30, 10d supply, fill #0

## 2024-05-19 NOTE — Progress Notes (Signed)
 Physical Therapy Session Note  Patient Details  Name: Jenny Yates MRN: [id number] Date of Birth: 02-Mar-1961  Today's Date: 05/19/2024 PT Individual Time: 1519-1601 PT Individual Time Calculation (min): 42 min   Short Term Goals: Week 1:  PT Short Term Goal 1 (Week 1): Pt will perform supine <> sit transfers with min A PT Short Term Goal 2 (Week 1): Pt will perform stand pivot transfers with supervision PT Short Term Goal 3 (Week 1): Pt will perform car transfer with min A  Skilled Therapeutic Interventions/Progress Updates:  Prior to session, discussion with care team re: pt's high LOF and short length of stay. [name] husband was also a critical care nurse and can take care of pt at home. Pt feeling better overall and with desire to return home.   Patient seated upright in w/c on entrance to room. Patient alert and agreeable to PT session. Session to focus on CLOF and ability to return home soon. Primary PT has provided info re: areas of minimal concern for focus of session.   Patient with no pain complaint at start of session and is in good spirits.   Therapeutic Activity: Bed Mobility: In ADL apartment bed, she is able to enter/ exit bed with use of a 4 step to side step into position in order to sit on EOB. Step up requires HHA/ CGA to utilize. Then sit <> supine with supervision/ Mod I. Relates that she has shaker bedroom suite with bedside table that she can use to push up from. No cueing required to complete. Good technique with trunk stable - no bending/ twisting.  Transfers: Pt performed sit<>stand and stand pivot transfers throughout session with supervision/ Mod I and good technique with no bending/ twisting. No cueing required.   Is able to utilize 6 step in order to enter/ exit car simulator. Brings BLE into footwell with CGA. Exits safely with no need for step.   Gait Training:  Pt ambulated 234' x1/ 66' x1/ 325' x1 using RW with supervision. Demonstrated good upright  posture with minimal need for cues to maintain level gaze.   Patient seated upright in w/c at end of session with brakes locked, no alarm set, and all needs within reach. Requesting Tylenol  from RN for back pain.   Message back to team re: pt's mobility and clearance to d/c on Wed 11/26.    Therapy Documentation Precautions:  Precautions Precautions: Fall, Back Recall of Precautions/Restrictions: Intact Precaution/Restrictions Comments: able to recall 3/3 precautions Required Braces or Orthoses: Spinal Brace (when OOB) Spinal Brace: Lumbar corset, Applied in sitting position Restrictions Weight Bearing Restrictions Per Provider Order: No  Pain: Pain Assessment Pain Scale: 0-10 Pain Score: 2  Pain Location: Back Pain Intervention(s): Medication (See eMAR) requested Tylenol  from nursing for pain relief.   Therapy/Group: Individual Therapy  Mliss DELENA Milliner PT, DPT, CSRS 05/19/2024, 4:50 PM

## 2024-05-19 NOTE — Progress Notes (Signed)
 PROGRESS NOTE   Subjective/Complaints:  Patient reports she feels well overall today.  Bowel movement Sunday and Saturday reported.  Reports aching in her lower back, pain overall under control.Reports she is Well last night, appetite has been okay.  ROS:no abd pain , no N/V/D + Constipation  Objective:   No results found.  No results for input(s): WBC, HGB, HCT, PLT in the last 72 hours.  No results for input(s): NA, K, CL, CO2, GLUCOSE, BUN, CREATININE, CALCIUM  in the last 72 hours.   Intake/Output Summary (Last 24 hours) at 05/19/2024 0850 Last data filed at 05/18/2024 1735 Gross per 24 hour  Intake 480 ml  Output --  Net 480 ml        Physical Exam: Vital Signs Blood pressure 127/84, pulse 78, temperature 97.7 F (36.5 C), temperature source Oral, resp. rate 17, height 4' 11 (1.499 m), weight 91.7 kg, SpO2 97%.   General: No acute distress Mood and affect are appropriate Heart: Regular rate and rhythm no rubs murmurs or extra sounds Lungs: Clear to auscultation, breathing unlabored, no rales or wheezes Abdomen: Positive bowel sounds, soft nontender to palpation, nondistended Extremities: No clubbing, cyanosis, or edema Skin: No evidence of breakdown, no evidence of rash   neuro: Alert and awake, responses little delayed, fluent, follows commands, cranial nerves II through XII grossly intact,      MOTOR: RUE: 4-/5 Deltoid, 4/5 Biceps, 4/5 Triceps,4/5 Grip LUE: 4-/5 Deltoid, 4/5 Biceps, 4/5 Triceps, 4/5 Grip RLE: HF 3/5, KE 4/5, ADF 4/5, APF 4/5 LLE: HF 3/5, KE 4/5, ADF 4/5, APF 4/5    SENSORY: Normal to touch all 4 extremities   MSK: no joint swelling  Prior neuro assessment is c/w today's exam 05/19/2024.  ADDENDUM photo 05/16/24- looks the same 11/23      Assessment/Plan: 1. Functional deficits which require 3+ hours per day of interdisciplinary therapy in a  comprehensive inpatient rehab setting. Physiatrist is providing close team supervision and 24 hour management of active medical problems listed below. Physiatrist and rehab team continue to assess barriers to discharge/monitor patient progress toward functional and medical goals  Care Tool:  Bathing              Bathing assist Assist Level: Minimal Assistance - Patient > 75%     Upper Body Dressing/Undressing Upper body dressing        Upper body assist Assist Level: Contact Guard/Touching assist    Lower Body Dressing/Undressing Lower body dressing            Lower body assist Assist for lower body dressing: Moderate Assistance - Patient 50 - 74%     Toileting Toileting    Toileting assist Assist for toileting: Minimal Assistance - Patient > 75%     Transfers Chair/bed transfer  Transfers assist     Chair/bed transfer assist level: Contact Guard/Touching assist     Locomotion Ambulation   Ambulation assist      Assist level: Contact Guard/Touching assist Assistive device: Walker-rolling Max distance: 150   Walk 10 feet activity   Assist     Assist level: Contact Guard/Touching assist Assistive device: Walker-rolling   Walk 50 feet activity  Assist    Assist level: Contact Guard/Touching assist Assistive device: Walker-rolling    Walk 150 feet activity   Assist    Assist level: Contact Guard/Touching assist Assistive device: Walker-rolling    Walk 10 feet on uneven surface  activity   Assist     Assist level: Contact Guard/Touching assist Assistive device: Walker-rolling   Wheelchair     Assist Is the patient using a wheelchair?: Yes Type of Wheelchair: Manual    Wheelchair assist level: Moderate Assistance - Patient 50 - 74% Max wheelchair distance: 5 ft    Wheelchair 50 feet with 2 turns activity    Assist    Wheelchair 50 feet with 2 turns activity did not occur:   (weakness/fatigue/pain)   Assist Level: Total Assistance - Patient < 25%   Wheelchair 150 feet activity     Assist  Wheelchair 150 feet activity did not occur:  (weakness/fatigue/pain)   Assist Level: Total Assistance - Patient < 25%   Blood pressure 127/84, pulse 78, temperature 97.7 F (36.5 C), temperature source Oral, resp. rate 17, height 4' 11 (1.499 m), weight 91.7 kg, SpO2 97%.  Medical Problem List and Plan: 1. Functional deficits secondary to history of L4-L5 PLIF 04/23/2024 by Dr. Mavis due to spinal stenosis, lumbar radiculopathy and  neurogenic claudication complicated by dehydration/acute metabolic encephalopathy. She had likely nondisplaced fracture of the left L4 transverse process after a fall              -patient may shower-cover incision please              -ELOS/Goals: 7-10 days, sup PT, Sup to min A OT             - Continue CIR 2.  Antithrombotics: -DVT/anticoagulation:  Mechanical: Antiembolism stockings, thigh (TED hose) Bilateral lower extremities             -antiplatelet therapy: N/A 3. Pain Management: Celebrex  200 mg daily, oxycodone  5 mg every 8 hours as needed moderate pain 4. Mood/Behavior/Sleep/anxiety/panic attacks.  Xanax  0.25 mg nightly as needed             -antipsychotic agents: N/A 5. Neuropsych/cognition: This patient is capable of making decisions on her own behalf. 6. Skin/Wound Care: Routine skin check 7. Fluids/Electrolytes/Nutrition: Routine in and outs with follow-up chemistries  - 11/20 check prealbumin tomorrow, continue protein supplements 8.  AKI/hyponatremia/hypotension.  Patient initially with ProAmatine  since discontinued.  Prior to admission patient on HCTZ 25 mg daily, hydralazine  50 mg twice daily, Bystolic  20 mg daily, Benicar  20 mg twice daily.  Monitor with increased mobility and resume as needed     05/19/2024    3:20 PM 05/19/2024    4:48 AM 05/18/2024    8:45 PM  Vitals with BMI  Systolic 123 127 866   Diastolic 74 84 74  Pulse 86 78 74  Irbesartan /Avapro  was started yesterday for hypertension by Dr. Carilyn, continue to monitor blood pressure, Appears improved overall                 Latest Ref Rng & Units 05/19/2024    9:04 AM 05/16/2024    6:12 AM 05/15/2024    4:42 AM  BMP  Glucose 70 - 99 mg/dL 888  821  99   BUN 8 - 23 mg/dL 17  22  15    Creatinine 0.44 - 1.00 mg/dL 9.23  9.17  9.11   Sodium 135 - 145 mmol/L 138  134  132  Potassium 3.5 - 5.1 mmol/L 3.5  3.5  3.5   Chloride 98 - 111 mmol/L 100  98  96   CO2 22 - 32 mmol/L 27  23  23    Calcium  8.9 - 10.3 mg/dL 8.9  8.7  8.8     9.  Hyperlipidemia.  Crestor  10.  Hypothyroidism.  Synthroid  11.  B12 deficiency.  Follow-up outpatient 12.  13 x 6 mm ground glass nodule right lung apex.  Follow-up outpatient 6 to 12 months chest CT. 13.  Class III obesity.  BMI 40.30.  Dietary follow-up 14.  History of alcohol and tobacco use.  Provide counseling 15. Leukocytosis          Improving on abx for UTI >100K Kleb S to keflex  so cefadroxil  should be S as well   - 11/24 WBC within normal limits 16.  Collagenous colitis  - On Entocort (budesonide )  - Patient reporting some mild constipation, will add some Colace.  Being cautious with laxatives to avoid causing much diarrhea.  Will check x-ray regarding stool burden  - 11/21 decrease budesonide  to 6 mg, discussed MiraLAX  as needed.  Will try to avoid stimulant laxatives  plans to take miralax  this am , discussed bisacodyl  supp this pm if no BM  - 11/24 patient had bowel movement yesterday 17. UTI  - 11/21 Duricef started monitor cultures  - Urine culture with Klebsiella sensitive to cefazolin   LOS: 5 days A FACE TO FACE EVALUATION WAS PERFORMED  Murray Collier 05/19/2024, 8:50 AM

## 2024-05-19 NOTE — Patient Instructions (Signed)
 1) Strengthening: Chest Pull - Resisted   Hold Theraband in front of body with hands about shoulder width a part. Pull band a part and back together slowly. Repeat __10-15__ times. Complete ___1_ set(s) per session.. Repeat ___1_ session(s) per day.  http://orth.exer.us/926   Copyright  VHI. All rights reserved.   2) PNF Strengthening: Resisted   Standing with resistive band around each hand, bring right arm up and away, thumb back. Repeat _10-15___ times per set. Do __1__ sets per session. Do __1__ sessions per day.    3) Resisted External Rotation: in Neutral - Bilateral   Sit or stand, tubing in both hands, elbows at sides, bent to 90, forearms forward. Pinch shoulder blades together and rotate forearms out. Keep elbows at sides. Repeat _10-15___ times per set. Do __1__ sets per session. Do _1___ sessions per day.  http://orth.exer.us/966   Copyright  VHI. All rights reserved.   4) PNF Strengthening: Resisted   Standing, hold resistive band above head. Bring right arm down and out from side. Repeat _10-15___ times per set. Do __1__ sets per session. Do __1__ sessions per day.  http://orth.exer.us/922   Copyright  VHI. All rights reserved.     ELASTIC BAND TRICEPS EXTENSION - SELF FIXATION  While seated, hold and fixate one end of an elastic band against your chest. Hold the other end with your opposite hand with your elbow bent and arm by your side.   Start by pulling the band downward so that the elbow goes from a bent position to a straightened position as shown. Return to starting position and repeat.  Complete 10-15 repetitions, 1 set.     Theraband Elbow Flexion  Loop the middle of the band around one foot  With arms straight by side and palms facing forward, hold onto each end of the band Bend arms bringing hands toward shoulders, keeping elbows by your side Return to starting position . Complete 10-15 repetitions, 1 set.

## 2024-05-19 NOTE — Progress Notes (Signed)
 Occupational Therapy Session Note  Patient Details  Name: Jenny Yates MRN: [id number] Date of Birth: Sep 01, 1960  Today's Date: 05/19/2024 OT Individual Time: 9054-8955 OT Individual Time Calculation (min): 59 min   OT Individual Time: 1415-1500 OT Individual Time Calculation (min): 45 min   Short Term Goals: Week 1:  OT Short Term Goal 1 (Week 1): STG=LTG d/t ELOS  Skilled Therapeutic Interventions/Progress Updates:     Session 1: Pt received sitting EOB, dressed and ready for the day with LSO donned upon OT arrival. Pt presenting to be in good spirits receptive to skilled OT session reporting 2/10 pain- OT offering intermittent rest breaks, repositioning, and therapeutic support to optimize participation in therapy session. Spent session outdoors to allow for change in environment and to support Pt moral. Focused this session on Pt education, community mobility, and activity tolerance. Pt able to complete functional mobility from room to outdoor location using rollator with SBA-CGA without LOB- appropriate recall to lock/unlock breaks, hand placement during sit<>stands, and appropriate pacing during functional mobility. Provided education on fall prevention and energy conservation techniques with Pt receptive to education and apply to apply techniques to her daily routine. Spent time discussing her current progress, POC, and discharge planning. Pt expressing she would like to d/c ASAP and that her husband is able to provide any assistance she may need with ADLs as he is in good health and a retried critical care RN. Informed medical team of [name] requests. Discussed community re-integration and safety consideration using RW. Pt then completed simulated community mobility navigating over uneven terrain, up/down inclines, and over curbs with verbal cues require for safety and RW positioning. Transported Pt back to room total A in wc for energy conservation. Pt was left resting in recliner with call  bell in reach, husband present in room, and all needs met.    Session 2:  Pt received sitting up in recliner presenting to be in good spirits receptive to skilled OT session reporting 0/10 pain- OT offering intermittent rest breaks, repositioning, and therapeutic support to optimize participation in therapy session. Focused this session on activity tolerance, dynamic balance, and transfer training. Pt completed functional mobility to/from therapy gym and between therapy spaces this session using RW for endurance and functional mobility training with SBA provided for safety and rest break following each bout of mobility. Provided education on bathroom safety and tub/shower transfer technique using RW. Pt demo'ing teach back as evidence of learning stepping over edge of tub to shower seat with CGA. Pt owns shower seat, grab bars, and HHSH. In therapy gym, engaged Pt in completing dynamic balance bean bag toss activity while standing on compliant surface no AD to work on standing balance, standing tolerance, and reactionary balance- able to complete 4x10 throws with SBA-CGA provided for safety. Pt was left resting in wc with call bell in reach and all needs met.    Therapy Documentation Precautions:  Precautions Precautions: Fall, Back Recall of Precautions/Restrictions: Intact Precaution/Restrictions Comments: able to recall 3/3 precautions Required Braces or Orthoses: Spinal Brace (when OOB) Spinal Brace: Lumbar corset, Applied in sitting position Restrictions Weight Bearing Restrictions Per Provider Order: No   Therapy/Group: Individual Therapy  Jenny Yates 05/19/2024, 7:29 AM

## 2024-05-20 ENCOUNTER — Other Ambulatory Visit (HOSPITAL_COMMUNITY): Payer: Self-pay

## 2024-05-20 DIAGNOSIS — K52831 Collagenous colitis: Secondary | ICD-10-CM | POA: Diagnosis not present

## 2024-05-20 DIAGNOSIS — N3000 Acute cystitis without hematuria: Secondary | ICD-10-CM | POA: Diagnosis not present

## 2024-05-20 DIAGNOSIS — I10 Essential (primary) hypertension: Secondary | ICD-10-CM

## 2024-05-20 DIAGNOSIS — M48061 Spinal stenosis, lumbar region without neurogenic claudication: Secondary | ICD-10-CM | POA: Diagnosis not present

## 2024-05-20 DIAGNOSIS — D72829 Elevated white blood cell count, unspecified: Secondary | ICD-10-CM | POA: Diagnosis not present

## 2024-05-20 MED ORDER — CEFADROXIL 500 MG PO CAPS
500.0000 mg | ORAL_CAPSULE | Freq: Two times a day (BID) | ORAL | Status: DC
  Administered 2024-05-20 – 2024-05-21 (×2): 500 mg via ORAL
  Filled 2024-05-20 (×2): qty 1

## 2024-05-20 NOTE — Plan of Care (Signed)
  Problem: Consults Goal: RH GENERAL PATIENT EDUCATION Description: See Patient Education module for education specifics. Outcome: Progressing   Problem: RH BOWEL ELIMINATION Goal: RH STG MANAGE BOWEL WITH ASSISTANCE Description: STG Manage Bowel with min Assistance. Outcome: Progressing   Problem: RH BLADDER ELIMINATION Goal: RH STG MANAGE BLADDER WITH ASSISTANCE Description: STG Manage Bladder With min Assistance Outcome: Progressing   Problem: RH SKIN INTEGRITY Goal: RH STG SKIN FREE OF INFECTION/BREAKDOWN Description: Manage skin free of infection with min assistance Outcome: Progressing   Problem: RH SAFETY Goal: RH STG ADHERE TO SAFETY PRECAUTIONS W/ASSISTANCE/DEVICE Description: STG Adhere to Safety Precautions With min Assistance/Device. Outcome: Progressing   Problem: RH PAIN MANAGEMENT Goal: RH STG PAIN MANAGED AT OR BELOW [name] PAIN GOAL Description: <4 / prns Outcome: Progressing   Problem: RH KNOWLEDGE DEFICIT GENERAL Goal: RH STG INCREASE KNOWLEDGE OF SELF CARE AFTER HOSPITALIZATION Description: Manage increase knowledge of self care after hospitalization with min assistance from husband using educational materials provided Outcome: Progressing

## 2024-05-20 NOTE — Progress Notes (Signed)
 Occupational Therapy Discharge Summary  Patient Details  Name: Jenny Yates MRN: [id number] Date of Birth: 13-Jan-1961  Date of Discharge from OT service:May 20, 2024  Patient has met 7 of 7 long term goals due to improved activity tolerance, improved balance, postural control, ability to compensate for deficits, improved awareness, and improved coordination.  Patient to discharge at overall Supervision level.  Patient's care partner is independent to provide the necessary physical assistance at discharge.    Reasons goals not met: N/A  Recommendation:  Patient will benefit from ongoing skilled OT services in home health setting to continue to advance functional skills in the area of BADL, iADL, and Reduce care partner burden.  Equipment: {equipment:3041657}  Reasons for discharge: {Reason for discharge:3049018}  Patient/family agrees with progress made and goals achieved: {Pt/Family agree with progress/goals:3049020}  OT Discharge Precautions/Restrictions  Precautions Precautions: Fall;Back Recall of Precautions/Restrictions: Intact Precaution/Restrictions Comments: able to recall 3/3 precautions Required Braces or Orthoses: Spinal Brace Spinal Brace: Lumbar corset;Applied in sitting position Restrictions Weight Bearing Restrictions Per Provider Order: No General   Vital Signs Therapy Vitals Temp: 98 F (36.7 C) Temp Source: Oral Pulse Rate: 84 Resp: 18 BP: (!) 147/75 Patient Position (if appropriate): Sitting Oxygen Therapy SpO2: 99 % O2 Device: Room Air Pain Pain Assessment Pain Scale: 0-10 Pain Score: 0-No pain ADL ADL Equipment Provided: Long-handled sponge Eating: Modified independent Where Assessed-Eating: Bed level Grooming: Modified independent Where Assessed-Grooming: Sitting at sink Upper Body Bathing: Modified independent Where Assessed-Upper Body Bathing: Shower Lower Body Bathing: Supervision/safety Where Assessed-Lower Body Bathing:  Shower Upper Body Dressing: Modified independent (Device) Where Assessed-Upper Body Dressing: Edge of bed Lower Body Dressing: Supervision/safety Where Assessed-Lower Body Dressing: Edge of bed Toileting: Supervision/safety Where Assessed-Toileting: Teacher, Adult Education: Close supervision Toilet Transfer Method: Proofreader: Drop arm Gaffer: Not assessed Film/video Editor: Close supervision Film/video Editor Method: Designer, Industrial/product: Information systems manager with back Vision Baseline Vision/History: 1 Wears glasses Patient Visual Report: No change from baseline Vision Assessment?: No apparent visual deficits Perception  Perception: Within Functional Limits Praxis Praxis: WFL Cognition Cognition Overall Cognitive Status: Within Functional Limits for tasks assessed Arousal/Alertness: Awake/alert Orientation Level: Person;Place;Situation Person: Oriented Place: Oriented Situation: Oriented Memory: Appears intact Awareness: Appears intact Problem Solving: Appears intact Brief Interview for Mental Status (BIMS) Repetition of Three Words (First Attempt): 3 Temporal Orientation: Year: Correct Temporal Orientation: Month: Accurate within 5 days Temporal Orientation: Day: Correct Recall: Sock: Yes, no cue required Recall: Blue: Yes, no cue required Recall: Bed: Yes, no cue required BIMS Summary Score: 15 Sensation Sensation Light Touch: Appears Intact Hot/Cold: Appears Intact Proprioception: Appears Intact Stereognosis: Not tested Additional Comments: below knee to feet, pt reporting occasional tingling but not constant Coordination Gross Motor Movements are Fluid and Coordinated: Yes Fine Motor Movements are Fluid and Coordinated: Yes Coordination and Movement Description: general weakness and debility Motor  Motor Motor: Within Functional Limits Motor - Skilled Clinical Observations:  generalized weakness and muscle fatigue Mobility  Bed Mobility Bed Mobility: Rolling Right;Rolling Left;Supine to Sit;Sit to Supine Rolling Right: Independent with assistive device Rolling Left: Independent with assistive device Supine to Sit: Independent with assistive device Sit to Supine: Independent with assistive device Transfers Sit to Stand: Independent with assistive device Stand to Sit: Independent with assistive device  Trunk/Postural Assessment  Cervical Assessment Cervical Assessment: Exceptions to [facility] Thoracic Assessment Thoracic Assessment: Exceptions to [facility] Lumbar Assessment Lumbar Assessment: Exceptions to [facility] Postural Control Postural Control: Within Functional  Limits Righting Reactions: wfl  Balance Balance Balance Assessed: Yes Static Sitting Balance Static Sitting - Balance Support: Feet supported Static Sitting - Level of Assistance: 7: Independent Dynamic Sitting Balance Dynamic Sitting - Balance Support: Feet supported Dynamic Sitting - Level of Assistance: 7: Independent Static Standing Balance Static Standing - Balance Support: Right upper extremity supported;Left upper extremity supported;Bilateral upper extremity supported Static Standing - Level of Assistance: 6: Modified independent (Device/Increase time) Dynamic Standing Balance Dynamic Standing - Balance Support: Right upper extremity supported;Left upper extremity supported;Bilateral upper extremity supported Dynamic Standing - Level of Assistance: 6: Modified independent (Device/Increase time) Extremity/Trunk Assessment RUE Assessment RUE Assessment: Within Functional Limits Active Range of Motion (AROM) Comments: WFL General Strength Comments: 4-/5, general weakness LUE Assessment LUE Assessment: Within Functional Limits Active Range of Motion (AROM) Comments: WFL General Strength Comments: 4-/5, general weakness   Jenny Yates 05/20/2024, 4:56 PM

## 2024-05-20 NOTE — Progress Notes (Signed)
 Physical Therapy Discharge Summary  Patient Details  Name: Jenny Yates MRN: [id number] Date of Birth: 11-29-1960  Date of Discharge from PT service:May 20, 2024   Patient has met 7 of 7 long term goals due to improved activity tolerance, improved balance, improved postural control, increased strength, decreased pain, ability to compensate for deficits, and improved coordination.  Patient to discharge at an ambulatory level Supervision/mod I.   Patient's care partner is independent to provide the necessary physical assistance at discharge.  Recommendation:  Patient will benefit from ongoing skilled PT services in outpatient setting to continue to advance safe functional mobility, address ongoing impairments in strength, balance, ambulation, transfers, and minimize fall risk.  Equipment: No equipment provided  Reasons for discharge: treatment goals met and discharge from hospital  Patient/family agrees with progress made and goals achieved: Yes  PT Discharge Precautions/Restrictions Precautions Precautions: Fall;Back Recall of Precautions/Restrictions: Intact Precaution/Restrictions Comments: able to recall 3/3 precautions Required Braces or Orthoses: Spinal Brace (when OOB) Restrictions Weight Bearing Restrictions Per Provider Order: No Pain Interference Pain Interference Pain Effect on Sleep: 1. Rarely or not at all Pain Interference with Therapy Activities: 1. Rarely or not at all Pain Interference with Day-to-Day Activities: 1. Rarely or not at all Vision/Perception  Vision - History Ability to See in Adequate Light: 0 Adequate Perception Perception: Within Functional Limits Praxis Praxis: WFL  Cognition Overall Cognitive Status: Within Functional Limits for tasks assessed Arousal/Alertness: Awake/alert Memory: Appears intact Awareness: Appears intact Problem Solving: Appears intact Safety/Judgment: Appears intact Sensation Sensation Light Touch: Appears  Intact Proprioception: Appears Intact Coordination Gross Motor Movements are Fluid and Coordinated: Yes Coordination and Movement Description: general weakness and debility Motor  Motor Motor: Within Functional Limits Motor - Skilled Clinical Observations: generalized weakness and muscle fatigue  Mobility Bed Mobility Bed Mobility: Rolling Right;Rolling Left;Supine to Sit;Sit to Supine Rolling Right: Independent with assistive device Rolling Left: Independent with assistive device Supine to Sit: Supervision/Verbal cueing Sit to Supine: Supervision/Verbal cueing Transfers Transfers: Sit to Stand;Stand to Sit;Stand Pivot Transfers Sit to Stand: Independent with assistive device Stand to Sit: Independent with assistive device Stand Pivot Transfers: Independent with assistive device Transfer (Assistive device): Rolling walker Locomotion  Gait Ambulation: Yes Gait Assistance: Supervision/Verbal cueing;Independent with assistive device (Mod I for 50 ft, supervision for longer distances) Gait Distance (Feet): 150 Feet Assistive device: Rolling walker Gait Assistance Details: Verbal cues for safe use of DME/AE;Verbal cues for precautions/safety Gait Gait: Yes Gait Pattern: Impaired Gait Pattern: Trunk flexed;Step-through pattern;Decreased step length - right;Decreased step length - left;Wide base of support Gait velocity: decreased Stairs / Additional Locomotion Stairs: Yes Stairs Assistance: Supervision/Verbal cueing Stair Management Technique: Two rails;Alternating pattern Number of Stairs: 12 Height of Stairs: 6 Ramp: Supervision/Verbal cueing Curb: Supervision/Verbal cueing Pick up small object from the floor assist level: Independent with assistive device Pick up small object from the floor assistive device: reacher Wheelchair Mobility Wheelchair Mobility: No  Trunk/Postural Assessment  Cervical Assessment Cervical Assessment: Exceptions to [facility] (forward head) Thoracic  Assessment Thoracic Assessment: Exceptions to [facility] (rounded shoulders) Lumbar Assessment Lumbar Assessment: Exceptions to [facility] (posterior pelvic tilt) Postural Control Postural Control: Within Functional Limits  Balance Balance Balance Assessed: Yes Static Sitting Balance Static Sitting - Balance Support: Feet supported Static Sitting - Level of Assistance: 7: Independent Dynamic Sitting Balance Dynamic Sitting - Balance Support: Feet supported Dynamic Sitting - Level of Assistance: 7: Independent Static Standing Balance Static Standing - Balance Support: Right upper extremity supported;Left upper extremity supported;Bilateral upper extremity  supported Static Standing - Level of Assistance: 6: Modified independent (Device/Increase time) Dynamic Standing Balance Dynamic Standing - Balance Support: Right upper extremity supported;Left upper extremity supported;Bilateral upper extremity supported Dynamic Standing - Level of Assistance: 6: Modified independent (Device/Increase time) Extremity Assessment  RLE Assessment RLE Assessment: Exceptions to [facility] General Strength Comments: Hip grossly 3+/5, knees/ankles grossly 4+/5 LLE Assessment LLE Assessment: Exceptions to [facility] General Strength Comments: Hip grossly 3+/5, knees/ankles grossly 4+/5   Comer CHRISTELLA Levora Comer Levora, PT, DPT 05/20/2024, 12:13 PM

## 2024-05-20 NOTE — Progress Notes (Signed)
 Patient ID: Jenny Yates, female   DOB: Nov 22, 1960, 63 y.o.   MRN: [id number]  Faxed clinicals and order for PT to [facility] on [address] [facility] [place], [place] [address]  Fax: [phone]. Patient/Spouse declined OT.

## 2024-05-20 NOTE — Progress Notes (Signed)
 Occupational Therapy Session Note  Patient Details  Name: Jenny Yates MRN: [id number] Date of Birth: [birth year]  Session 1: Today's Date: 05/20/2024 OT Individual Time: 8897-8797 OT Individual Time Calculation (min): 60 min    Session 2: Today's Date: 05/20/2024 OT Individual Time: 8396-8364 OT Individual Time Calculation (min): 32 min    Short Term Goals: Week 1:  OT Short Term Goal 1 (Week 1): STG=LTG d/t ELOS   Session 1: Skilled Therapeutic Interventions/Progress Updates:  Patient agreeable to participate in OT session. Reports no pain level. Patient participated in skilled OT session focusing on D/C planning, and functional self care. Patient received in bed. Patient requested to complete showering / B & D session. Patient able to grab clothes and complete shower transfer with SUP. Patient able to complete showering with SUP A overall to mod I. Patient then completed functional mobility back to bed with SUP. Patient SUP to mod I to dress with AE with overall good balance. Patient and nursing changing bandages on back with normal drainage. Patient and OT discussed D/c Planning with spouse and OT and patient set up for [facility] therapy. Patient left in chair with spouse ready for meal.    Session 2: Skilled Therapeutic Interventions/Progress Updates:  Patient agreeable to participate in OT session. Reports no pain level.   Patient participated in skilled OT session focusing on d/c discussion planning, wound care ,dressing for bed. Patient received at EOB. Patient completed transfer to bed SUP to mod I. Patient able to doff clothes and brace mod I. Ot assisted in wound care due to increased drainage from back incision. Nursing notified. Patient discussed all BIMS items and reported feeling safe with D/C. Patient returned to bed following changing alarm on all needs in reach.    Therapy Documentation Precautions:  Precautions Precautions: Fall, Back Recall of  Precautions/Restrictions: Intact Precaution/Restrictions Comments: able to recall 3/3 precautions Required Braces or Orthoses: Spinal Brace (when OOB) Spinal Brace: Lumbar corset, Applied in sitting position Restrictions Weight Bearing Restrictions Per Provider Order: No    Therapy/Group: Individual Therapy  [name] 05/20/2024, 7:28 AM

## 2024-05-20 NOTE — Plan of Care (Signed)
  Problem: RH Balance Goal: LTG Patient will maintain dynamic standing balance (PT) Description: LTG:  Patient will maintain dynamic standing balance with assistance during mobility activities (PT) Outcome: Completed/Met   Problem: RH Bed Mobility Goal: LTG Patient will perform bed mobility with assist (PT) Description: LTG: Patient will perform bed mobility with assistance, with/without cues (PT). Outcome: Completed/Met   Problem: RH Bed to Chair Transfers Goal: LTG Patient will perform bed/chair transfers w/assist (PT) Description: LTG: Patient will perform bed to chair transfers with assistance (PT). Outcome: Completed/Met   Problem: RH Car Transfers Goal: LTG Patient will perform car transfers with assist (PT) Description: LTG: Patient will perform car transfers with assistance (PT). Outcome: Completed/Met   Problem: RH Ambulation Goal: LTG Patient will ambulate in controlled environment (PT) Description: LTG: Patient will ambulate in a controlled environment, # of feet with assistance (PT). Outcome: Completed/Met Goal: LTG Patient will ambulate in home environment (PT) Description: LTG: Patient will ambulate in home environment, # of feet with assistance (PT). Outcome: Completed/Met   Problem: RH Stairs Goal: LTG Patient will ambulate up and down stairs w/assist (PT) Description: LTG: Patient will ambulate up and down # of stairs with assistance (PT) Outcome: Completed/Met   

## 2024-05-20 NOTE — Plan of Care (Signed)
 Per team discussion patient is doing well and extremely ready to go home. Patient husband can provide 24/7 assist. Per MD patient is ready to go. Patient has already her own rolling walker and does not need any DME. Patient will follow up with outpatient therapy. Per team, patient will be discharging 05/21/24.

## 2024-05-20 NOTE — Progress Notes (Signed)
 Physical Therapy Session Note  Patient Details  Name: Jenny Yates MRN: [id number] Date of Birth: Jan 25, 1961  Today's Date: 05/20/2024 PT Individual Time: 0800-0900, 8669-8572 PT Individual Time Calculation (min): 60 min, 57 min  Short Term Goals: Week 1:  PT Short Term Goal 1 (Week 1): Pt will perform supine <> sit transfers with min A PT Short Term Goal 2 (Week 1): Pt will perform stand pivot transfers with supervision PT Short Term Goal 3 (Week 1): Pt will perform car transfer with min A  Skilled Therapeutic Interventions/Progress Updates:     Treatment 1: Pt supine in bed upon arrival. Pt denies pain and agreeable to therapy. Session emphasized functional strengthening and activity tolerance with ambulation, transfers, stair negotiation, and functional transfers. Pt performed L/R rolling with mod I. Pt performed supine <> sit transfers with supervision. Pt performs sit <> stand transfers using RW with mod I. Pt amb >150 ft room to main gym using RW with supervision for longer distance. Pt asc/desc 12-6 inch steps with close supervision using B HR. Pt utilized reacher to pick up cone while standing in RW with mod I. Pt asc/desc 10 ft ramp using RW with supervision. Pt instructed in and performed 1x10 the following B LE strengthening exercises using B UE support to establish HEP: mini squats, hip abduction, hip extension, hamstring curl, and marching. Pt demonstrated and verbalized understanding. PT issued illustrated handout for pt reference at home. At end of session, pt transferred to recliner with mod I using RW and all needs within reach. [name] husband present at end of session.  Treatment 2: Pt seated in recliner upon arrival. Pt denies pain and agreeable to therapy. Session emphasized improving independence and functional strengthening with car transfers and DC planning. Pt transported dependent in [facility] from room to outside atrium to practice car transfers on pt's personal vehicle. Pt  performed multiple transfers RW <> car seat using her own 3-inch step with CGA initally, quickly progressing to SBA/close supervision. Pt practiced transfers in forward and backward directions. [name] husband also practiced providing CGA/close supervision for these transfers. Pt also practiced using EZ out car door handle for UE support during transfers. Pt and her husband verbalized and demonstrated understanding and safety with car transfers. Pt transported dependent in WC back to floor. Pt utilized NuStep level 4 x 10 min for improved functional UE/LE strengthening. At end of session, pt transferred to recliner with mod I using RW and all needs within reach. All questions answered.  Therapy Documentation Precautions:  Precautions Precautions: Fall, Back Recall of Precautions/Restrictions: Intact Precaution/Restrictions Comments: able to recall 3/3 precautions Required Braces or Orthoses: Spinal Brace (when OOB) Spinal Brace: Lumbar corset, Applied in sitting position Restrictions Weight Bearing Restrictions Per Provider Order: No  Therapy/Group: Individual Therapy  Comer CHRISTELLA Levora Comer Levora, PT, DPT 05/20/2024, 8:21 AM

## 2024-05-20 NOTE — Progress Notes (Signed)
 PROGRESS NOTE   Subjective/Complaints:  Would like to go back on home BP medication after DC. No new other concerns this AM. Looking forward to DC tomorrow.   ROS:no abd pain , no N/V/D + Constipation-improved  Objective:   No results found.  Recent Labs    05/19/24 0904  WBC 9.8  HGB 10.8*  HCT 33.4*  PLT 289    Recent Labs    05/19/24 0904  NA 138  K 3.5  CL 100  CO2 27  GLUCOSE 111*  BUN 17  CREATININE 0.76  CALCIUM  8.9     Intake/Output Summary (Last 24 hours) at 05/20/2024 1349 Last data filed at 05/20/2024 1344 Gross per 24 hour  Intake 660 ml  Output --  Net 660 ml        Physical Exam: Vital Signs Blood pressure (!) 145/81, pulse 84, temperature 97.8 F (36.6 C), temperature source Oral, resp. rate 19, height 4' 11 (1.499 m), weight 91.7 kg, SpO2 94%.   General: No acute distress Mood and affect are appropriate Heart: Regular rate and rhythm no rubs murmurs or extra sounds Lungs: Clear to auscultation, breathing unlabored, no rales or wheezes Abdomen: Positive bowel sounds, soft nontender to palpation, nondistended Extremities: No clubbing, cyanosis, or edema Skin: Incision with small amount of serous drainage- clean and intact ortherwise   neuro: Alert and awake, responses little delayed, fluent, follows commands, cranial nerves II through XII grossly intact,      MOTOR: RUE: 4-/5 Deltoid, 4/5 Biceps, 4/5 Triceps,4/5 Grip LUE: 4-/5 Deltoid, 4/5 Biceps, 4/5 Triceps, 4/5 Grip RLE: HF 3/5, KE 4/5, ADF 4/5, APF 4/5 LLE: HF 3/5, KE 4/5, ADF 4/5, APF 4/5    SENSORY: Normal to touch all 4 extremities   MSK: no joint swelling  Prior neuro assessment is c/w today's exam 05/20/2024.  ADDENDUM photo 05/16/24- looks the same 11/23      Assessment/Plan: 1. Functional deficits which require 3+ hours per day of interdisciplinary therapy in a comprehensive inpatient rehab  setting. Physiatrist is providing close team supervision and 24 hour management of active medical problems listed below. Physiatrist and rehab team continue to assess barriers to discharge/monitor patient progress toward functional and medical goals  Care Tool:  Bathing              Bathing assist Assist Level: Minimal Assistance - Patient > 75%     Upper Body Dressing/Undressing Upper body dressing        Upper body assist Assist Level: Contact Guard/Touching assist    Lower Body Dressing/Undressing Lower body dressing            Lower body assist Assist for lower body dressing: Moderate Assistance - Patient 50 - 74%     Toileting Toileting    Toileting assist Assist for toileting: Minimal Assistance - Patient > 75%     Transfers Chair/bed transfer  Transfers assist     Chair/bed transfer assist level: Independent with assistive device Chair/bed transfer assistive device: Arboriculturist assist      Assist level: Supervision/Verbal cueing Assistive device: Walker-rolling Max distance: 150   Walk 10 feet activity  Assist     Assist level: Independent with assistive device Assistive device: Walker-rolling   Walk 50 feet activity   Assist    Assist level: Independent with assistive device Assistive device: Walker-rolling    Walk 150 feet activity   Assist    Assist level: Supervision/Verbal cueing Assistive device: Walker-rolling    Walk 10 feet on uneven surface  activity   Assist     Assist level: Supervision/Verbal cueing Assistive device: Walker-rolling   Wheelchair     Assist Is the patient using a wheelchair?: No Type of Wheelchair: Manual    Wheelchair assist level: Moderate Assistance - Patient 50 - 74% Max wheelchair distance: 5 ft    Wheelchair 50 feet with 2 turns activity    Assist    Wheelchair 50 feet with 2 turns activity did not occur:   (weakness/fatigue/pain)   Assist Level: Total Assistance - Patient < 25%   Wheelchair 150 feet activity     Assist  Wheelchair 150 feet activity did not occur:  (weakness/fatigue/pain)   Assist Level: Total Assistance - Patient < 25%   Blood pressure (!) 145/81, pulse 84, temperature 97.8 F (36.6 C), temperature source Oral, resp. rate 19, height 4' 11 (1.499 m), weight 91.7 kg, SpO2 94%.  Medical Problem List and Plan: 1. Functional deficits secondary to history of L4-L5 PLIF 04/23/2024 by Dr. Mavis due to spinal stenosis, lumbar radiculopathy and  neurogenic claudication complicated by dehydration/acute metabolic encephalopathy. She had likely nondisplaced fracture of the left L4 transverse process after a fall              -patient may shower-cover incision please              -ELOS/Goals: 7-10 days, sup PT, Sup to min A OT             - Continue CIR  -Plan for DC tomorrow  2.  Antithrombotics: -DVT/anticoagulation:  Mechanical: Antiembolism stockings, thigh (TED hose) Bilateral lower extremities             -antiplatelet therapy: N/A 3. Pain Management: Celebrex  200 mg daily, oxycodone  5 mg every 8 hours as needed moderate pain 4. Mood/Behavior/Sleep/anxiety/panic attacks.  Xanax  0.25 mg nightly as needed             -antipsychotic agents: N/A 5. Neuropsych/cognition: This patient is capable of making decisions on her own behalf. 6. Skin/Wound Care: Routine skin check 7. Fluids/Electrolytes/Nutrition: Routine in and outs with follow-up chemistries  - 11/20 check prealbumin tomorrow, continue protein supplements 8.  AKI/hyponatremia/hypotension.  Patient initially with ProAmatine  since discontinued.  Prior to admission patient on HCTZ 25 mg daily, hydralazine  50 mg twice daily, Bystolic  20 mg daily, Benicar  20 mg twice daily.  Monitor with increased mobility and resume as needed     05/20/2024    5:06 AM 05/19/2024    7:32 PM 05/19/2024    3:20 PM  Vitals with BMI   Systolic 145 135 876  Diastolic 81 80 74  Pulse 84 81 86  Irbesartan /Avapro  was started yesterday for hypertension by Dr. Carilyn, continue to monitor blood pressure, Appears improved overall Pt would like to go back to Bystolic  on Discharge                 Latest Ref Rng & Units 05/19/2024    9:04 AM 05/16/2024    6:12 AM 05/15/2024    4:42 AM  BMP  Glucose 70 - 99 mg/dL 888  821  99  BUN 8 - 23 mg/dL 17  22  15    Creatinine 0.44 - 1.00 mg/dL 9.23  9.17  9.11   Sodium 135 - 145 mmol/L 138  134  132   Potassium 3.5 - 5.1 mmol/L 3.5  3.5  3.5   Chloride 98 - 111 mmol/L 100  98  96   CO2 22 - 32 mmol/L 27  23  23    Calcium  8.9 - 10.3 mg/dL 8.9  8.7  8.8     9.  Hyperlipidemia.  Crestor  10.  Hypothyroidism.  Synthroid  11.  B12 deficiency.  Follow-up outpatient 12.  13 x 6 mm ground glass nodule right lung apex.  Follow-up outpatient 6 to 12 months chest CT. 13.  Class III obesity.  BMI 40.30.  Dietary follow-up 14.  History of alcohol and tobacco use.  Provide counseling 15. Leukocytosis          Improving on abx for UTI >100K Kleb S to keflex  so cefadroxil  should be S as well   - 11/24 WBC within normal limits  Recheck tomorrow 16.  Collagenous colitis  - On Entocort (budesonide )  - Patient reporting some mild constipation, will add some Colace.  Being cautious with laxatives to avoid causing much diarrhea.  Will check x-ray regarding stool burden  - 11/21 decrease budesonide  to 6 mg, discussed MiraLAX  as needed.  Will try to avoid stimulant laxatives  plans to take miralax  this am , discussed bisacodyl  supp this pm if no BM  - 11/24 patient had bowel movement yesterday 17. UTI  - 11/21 Duricef started monitor cultures  - Urine culture with Klebsiella sensitive to cefazolin   -continue for 7 days total  LOS: 6 days A FACE TO FACE EVALUATION WAS PERFORMED  Murray Collier 05/20/2024, 1:49 PM

## 2024-05-21 ENCOUNTER — Other Ambulatory Visit (HOSPITAL_COMMUNITY): Payer: Self-pay

## 2024-05-21 LAB — BASIC METABOLIC PANEL WITH GFR
Anion gap: 10 (ref 5–15)
BUN: 22 mg/dL (ref 8–23)
CO2: 26 mmol/L (ref 22–32)
Calcium: 9 mg/dL (ref 8.9–10.3)
Chloride: 102 mmol/L (ref 98–111)
Creatinine, Ser: 0.74 mg/dL (ref 0.44–1.00)
GFR, Estimated: >60 mL/min (ref >60)
Glucose, Bld: 120 mg/dL — ABNORMAL HIGH (ref 70–99)
Potassium: 4.2 mmol/L (ref 3.5–5.1)
Sodium: 138 mmol/L (ref 135–145)

## 2024-05-21 LAB — CBC
HCT: 31.9 % — ABNORMAL LOW (ref 36.0–46.0)
Hemoglobin: 10.4 g/dL — ABNORMAL LOW (ref 12.0–15.0)
MCH: 31 pg (ref 26.0–34.0)
MCHC: 32.6 g/dL (ref 30.0–36.0)
MCV: 94.9 fL (ref 80.0–100.0)
Platelets: 327 K/uL (ref 150–400)
RBC: 3.36 MIL/uL — ABNORMAL LOW (ref 3.87–5.11)
RDW: 13.3 % (ref 11.5–15.5)
WBC: 8 K/uL (ref 4.0–10.5)
nRBC: 0 % (ref 0.0–0.2)

## 2024-05-21 MED ORDER — POLYETHYLENE GLYCOL 3350 17 G PO PACK: 17.0000 g | PACK | Freq: Every day | ORAL | Status: DC | PRN

## 2024-05-21 MED ORDER — CEFADROXIL 500 MG PO CAPS
500.0000 mg | ORAL_CAPSULE | Freq: Two times a day (BID) | ORAL | 0 refills | Status: AC
  Filled 2024-05-21: qty 2, 1d supply, fill #0

## 2024-05-21 MED ORDER — BUDESONIDE 3 MG PO CPEP
6.0000 mg | ORAL_CAPSULE | Freq: Every day | ORAL | 0 refills | Status: DC
  Filled 2024-05-21: qty 60, 30d supply, fill #0

## 2024-05-21 NOTE — Progress Notes (Signed)
 Inpatient Rehabilitation Care Coordinator Discharge Note   Patient Details  Name: Jenny Yates MRN: [id number] Date of Birth: [birth year]   Discharge location: Home with spouse, Leigh  Length of Stay: 6 days  Discharge activity level: Supervision/Verbal cueing  Home/community participation: Active in the community with DME  Patient response un:Yzjouy Literacy - How often do you need to have someone help you when you read instructions, pamphlets, or other written material from your doctor or pharmacy?: Never  Patient response un:Dnrpjo Isolation - How often do you feel lonely or isolated from those around you?: Never  Services provided included: MD, RD, PT, OT, SLP, RN, CM, TR, Pharmacy, SW  Financial Services:  Field Seismologist Utilized: Private Insurance [employer] / [employer] [facility]  Choices offered to/list presented to: Patient/Spouse  Follow-up services arranged:  Outpatient    Outpatient Servicies: PT - Patient declined OT services      Patient response to transportation need: Is the patient able to respond to transportation needs?: Yes In the past 12 months, has lack of transportation kept you from medical appointments or from getting medications?: No In the past 12 months, has lack of transportation kept you from meetings, work, or from getting things needed for daily living?: No   Patient/Family verbalized understanding of follow-up arrangements:  Yes  Individual responsible for coordination of the follow-up plan: Patient/Spouse  Confirmed correct DME delivered: [employer]  Loreli 05/21/2024    Comments (or additional information): Patient doing very well. Spouse able to address 24/7 care needs.   Summary of Stay    Date/Time Discharge Planning CSW  05/20/24 1406 Will discharge home with spouse providing 24/7 support and care as needed. OP PT set up with [facility]. OT services declined. No DME needed. DS       [employer]  Loreli

## 2024-05-21 NOTE — Progress Notes (Signed)
 Inpatient Rehabilitation Discharge Medication Review by a Pharmacist  A complete drug regimen review was completed for this patient to identify any potential clinically significant medication issues.   High Risk Drug Classes Is patient taking? Indication by Medication  Antipsychotic No    Anticoagulant No    Antibiotic yes  Duricef x1 more day - UTI  Opioid Yes Oxycodone  - pain  Antiplatelet No    Hypoglycemics/insulin No    Vasoactive Medication Yes  Nebivolol  - HTN  Paused taking the hydralazine ,  hydrochlorothiazide , olmesartan  until doctor or provider resumes- HTN     Chemotherapy No    Other Yes Celebrex  - pain Xanax  - anxiety Entocort - colitis Flonase  - allergy Synthroid  - hypothyroidism MVI/thiamine , VitD - supplement Crestor  - HLD Miralax , bisacodyl  - constipation        Type of Medication Issue Identified Description of Issue Recommendation(s)  Drug Interaction(s) (clinically significant)        Duplicate Therapy        Allergy        No Medication Administration End Date        Incorrect Dose        Additional Drug Therapy Needed        Significant med changes from prior encounter (inform family/care partners about these prior to discharge).      Other   Paused taking the hydralazine ,  hydrochlorothiazide , olmesartan  until doctor or provider resume. Communicate to patient /family/ caregiver prior to discharge. It is noted in the AVS  to pause taking these and follow up with PCP as to when to resume.       Clinically significant medication issues were identified that warrant physician communication and completion of prescribed/recommended actions by midnight of the next day:  No   Name of provider notified for urgent issues identified:    Provider Method of Notification:        Pharmacist comments:    Time spent performing this drug regimen review (minutes):  30   Jenny Yates, [place] Clinical Pharmacist 05/21/2024 8:13 AM

## 2024-05-21 NOTE — Plan of Care (Signed)
 Continue current plan of care.

## 2024-05-21 NOTE — Progress Notes (Signed)
 [name] NP provided discharge instructions, (1) medication sent to [facility] per patient and husband request. Staff assisted patient off the unit; patient discharged safely via private car with husband.   Geni Armor, LPN

## 2024-05-21 NOTE — Plan of Care (Signed)
  Problem: RH Grooming Goal: LTG Patient will perform grooming w/assist,cues/equip (OT) Description: LTG: Patient will perform grooming with assist, with/without cues using equipment (OT) Outcome: Completed/Met   Problem: RH Bathing Goal: LTG Patient will bathe all body parts with assist levels (OT) Description: LTG: Patient will bathe all body parts with assist levels (OT) Outcome: Completed/Met   Problem: RH Dressing Goal: LTG Patient will perform upper body dressing (OT) Description: LTG Patient will perform upper body dressing with assist, with/without cues (OT). Outcome: Completed/Met Goal: LTG Patient will perform lower body dressing w/assist (OT) Description: LTG: Patient will perform lower body dressing with assist, with/without cues in positioning using equipment (OT) Outcome: Completed/Met   Problem: RH Toileting Goal: LTG Patient will perform toileting task (3/3 steps) with assistance level (OT) Description: LTG: Patient will perform toileting task (3/3 steps) with assistance level (OT)  Outcome: Completed/Met   Problem: RH Toilet Transfers Goal: LTG Patient will perform toilet transfers w/assist (OT) Description: LTG: Patient will perform toilet transfers with assist, with/without cues using equipment (OT) Outcome: Completed/Met   Problem: RH Tub/Shower Transfers Goal: LTG Patient will perform tub/shower transfers w/assist (OT) Description: LTG: Patient will perform tub/shower transfers with assist, with/without cues using equipment (OT) Outcome: Completed/Met

## 2024-05-21 NOTE — Progress Notes (Signed)
 PROGRESS NOTE   Subjective/Complaints:  Patient does report to going home.  Had a bowel movement yesterday.  ROS:no abd pain , no N/V/D, chest pain, fever or chill + Constipation-improved  Objective:   No results found.  Recent Labs    05/19/24 0904 05/21/24 0448  WBC 9.8 8.0  HGB 10.8* 10.4*  HCT 33.4* 31.9*  PLT 289 327    Recent Labs    05/19/24 0904 05/21/24 0448  NA 138 138  K 3.5 4.2  CL 100 102  CO2 27 26  GLUCOSE 111* 120*  BUN 17 22  CREATININE 0.76 0.74  CALCIUM  8.9 9.0     Intake/Output Summary (Last 24 hours) at 05/21/2024 1001 Last data filed at 05/21/2024 0747 Gross per 24 hour  Intake 660 ml  Output --  Net 660 ml        Physical Exam: Vital Signs Blood pressure 138/84, pulse 81, temperature 98 F (36.7 C), temperature source Oral, resp. rate 18, height 4' 11 (1.499 m), weight 91.7 kg, SpO2 96%.   General: No acute distress Mood and affect are appropriate Heart: Regular rate and rhythm no rubs murmurs or extra sounds Lungs: Clear to auscultation, breathing unlabored, no rales or wheezes Abdomen: Positive bowel sounds, soft nontender to palpation, nondistended Extremities: No clubbing, cyanosis, or edema Skin: Lumbar incision without any significant erythema, intact.  Small amount of serous drainage on dressing (little decreased from prior days, husband will continue to monitor closely and call NSGY if any changes)   neuro: Alert and awake, responses little delayed, fluent, follows commands, cranial nerves II through XII grossly intact,      MOTOR: Moving all 4 extremities to gravity and resistance   SENSORY: Normal to touch all 4 extremities   MSK: no joint swelling  Prior neuro assessment is c/w today's exam 05/21/2024.     Assessment/Plan: 1. Functional deficits which require 3+ hours per day of interdisciplinary therapy in a comprehensive inpatient rehab  setting. Physiatrist is providing close team supervision and 24 hour management of active medical problems listed below. Physiatrist and rehab team continue to assess barriers to discharge/monitor patient progress toward functional and medical goals  Care Tool:  Bathing    Body parts bathed by patient: Right arm, Right upper leg, Left arm, Left upper leg, Chest, Right lower leg, Abdomen, Front perineal area, Left lower leg, Buttocks, Face         Bathing assist Assist Level: Supervision/Verbal cueing     Upper Body Dressing/Undressing Upper body dressing   What is the patient wearing?: Pull over shirt    Upper body assist Assist Level: Independent with assistive device    Lower Body Dressing/Undressing Lower body dressing      What is the patient wearing?: Underwear/pull up, Pants     Lower body assist Assist for lower body dressing: Supervision/Verbal cueing     Toileting Toileting    Toileting assist Assist for toileting: Supervision/Verbal cueing     Transfers Chair/bed transfer  Transfers assist     Chair/bed transfer assist level: Independent with assistive device Chair/bed transfer assistive device: Geologist, Engineering   Ambulation assist  Assist level: Supervision/Verbal cueing Assistive device: Walker-rolling Max distance: 150   Walk 10 feet activity   Assist     Assist level: Independent with assistive device Assistive device: Walker-rolling   Walk 50 feet activity   Assist    Assist level: Independent with assistive device Assistive device: Walker-rolling    Walk 150 feet activity   Assist    Assist level: Supervision/Verbal cueing Assistive device: Walker-rolling    Walk 10 feet on uneven surface  activity   Assist     Assist level: Supervision/Verbal cueing Assistive device: Walker-rolling   Wheelchair     Assist Is the patient using a wheelchair?: No Type of Wheelchair: Manual     Wheelchair assist level: Moderate Assistance - Patient 50 - 74% Max wheelchair distance: 5 ft    Wheelchair 50 feet with 2 turns activity    Assist    Wheelchair 50 feet with 2 turns activity did not occur:  (weakness/fatigue/pain)   Assist Level: Total Assistance - Patient < 25%   Wheelchair 150 feet activity     Assist  Wheelchair 150 feet activity did not occur:  (weakness/fatigue/pain)   Assist Level: Total Assistance - Patient < 25%   Blood pressure 138/84, pulse 81, temperature 98 F (36.7 C), temperature source Oral, resp. rate 18, height 4' 11 (1.499 m), weight 91.7 kg, SpO2 96%.  Medical Problem List and Plan: 1. Functional deficits secondary to history of L4-L5 PLIF 04/23/2024 by Dr. Mavis due to spinal stenosis, lumbar radiculopathy and  neurogenic claudication complicated by dehydration/acute metabolic encephalopathy. She had likely nondisplaced fracture of the left L4 transverse process after a fall              -patient may shower-cover incision please              -ELOS/Goals: 7-10 days, sup PT, Sup to min A OT             - Continue CIR  - DC home today 2.  Antithrombotics: -DVT/anticoagulation:  Mechanical: Antiembolism stockings, thigh (TED hose) Bilateral lower extremities             -antiplatelet therapy: N/A 3. Pain Management: Celebrex  200 mg daily, oxycodone  5 mg every 8 hours as needed moderate pain 4. Mood/Behavior/Sleep/anxiety/panic attacks.  Xanax  0.25 mg nightly as needed             -antipsychotic agents: N/A 5. Neuropsych/cognition: This patient is capable of making decisions on her own behalf. 6. Skin/Wound Care: Routine skin check 7. Fluids/Electrolytes/Nutrition: Routine in and outs with follow-up chemistries  - 11/20 check prealbumin tomorrow, continue protein supplements 8.  AKI/hyponatremia/hypotension.  Patient initially with ProAmatine  since discontinued.  Prior to admission patient on HCTZ 25 mg daily, hydralazine  50 mg  twice daily, Bystolic  20 mg daily, Benicar  20 mg twice daily.  Monitor with increased mobility and resume as needed     05/21/2024    5:21 AM 05/20/2024    7:21 PM 05/20/2024    3:48 PM  Vitals with BMI  Systolic 138 142 852  Diastolic 84 81 75  Pulse 81 85 84  Irbesartan /Avapro  was started yesterday for hypertension by Dr. Carilyn, continue to monitor blood pressure, Appears improved overall Patient will discontinue irbesartan  today and restart her Benicar  at 10 mg dose, she will hold off on other BP medications for now                 Latest Ref Rng & Units 05/21/2024  4:48 AM 05/19/2024    9:04 AM 05/16/2024    6:12 AM  BMP  Glucose 70 - 99 mg/dL 879  888  821   BUN 8 - 23 mg/dL 22  17  22    Creatinine 0.44 - 1.00 mg/dL 9.25  9.23  9.17   Sodium 135 - 145 mmol/L 138  138  134   Potassium 3.5 - 5.1 mmol/L 4.2  3.5  3.5   Chloride 98 - 111 mmol/L 102  100  98   CO2 22 - 32 mmol/L 26  27  23    Calcium  8.9 - 10.3 mg/dL 9.0  8.9  8.7     9.  Hyperlipidemia.  Crestor  10.  Hypothyroidism.  Synthroid  11.  B12 deficiency.  Follow-up outpatient 12.  13 x 6 mm ground glass nodule right lung apex.  Follow-up outpatient 6 to 12 months chest CT. 13.  Class III obesity.  BMI 40.30.  Dietary follow-up 14.  History of alcohol and tobacco use.  Provide counseling 15. Leukocytosis          Improving on abx for UTI >100K Kleb S to keflex  so cefadroxil  should be S as well   - 11/24 WBC within normal limits  -11/26 remains stable at WBC 8.0 today 16.  Collagenous colitis  - On Entocort (budesonide )  - Patient reporting some mild constipation, will add some Colace.  Being cautious with laxatives to avoid causing much diarrhea.  Will check x-ray regarding stool burden  - 11/21 decrease budesonide  to 6 mg, discussed MiraLAX  as needed.  Will try to avoid stimulant laxatives  plans to take miralax  this am , discussed bisacodyl  supp this pm if no BM  - 11/25 BM yesterday improved 17. UTI  -  11/21 Duricef started monitor cultures  - Urine culture with Klebsiella sensitive to cefazolin   - Continue Duricef for duration of course  LOS: 7 days A FACE TO FACE EVALUATION WAS PERFORMED  Murray Collier 05/21/2024, 10:01 AM

## 2024-05-26 ENCOUNTER — Other Ambulatory Visit: Payer: Self-pay | Admitting: Internal Medicine

## 2024-05-26 DIAGNOSIS — Z1231 Encounter for screening mammogram for malignant neoplasm of breast: Secondary | ICD-10-CM

## 2024-05-27 ENCOUNTER — Ambulatory Visit: Admitting: Internal Medicine

## 2024-05-27 ENCOUNTER — Encounter: Payer: Self-pay | Admitting: Internal Medicine

## 2024-05-27 VITALS — BP 138/90 | HR 92 | Temp 97.7°F | Resp 16 | Ht 59.0 in | Wt 190.2 lb

## 2024-05-27 DIAGNOSIS — K52831 Collagenous colitis: Secondary | ICD-10-CM

## 2024-05-27 DIAGNOSIS — M150 Primary generalized (osteo)arthritis: Secondary | ICD-10-CM | POA: Diagnosis not present

## 2024-05-27 DIAGNOSIS — Z0000 Encounter for general adult medical examination without abnormal findings: Secondary | ICD-10-CM | POA: Diagnosis not present

## 2024-05-27 DIAGNOSIS — M48061 Spinal stenosis, lumbar region without neurogenic claudication: Secondary | ICD-10-CM | POA: Diagnosis not present

## 2024-05-27 DIAGNOSIS — E039 Hypothyroidism, unspecified: Secondary | ICD-10-CM

## 2024-05-27 DIAGNOSIS — E7800 Pure hypercholesterolemia, unspecified: Secondary | ICD-10-CM

## 2024-05-27 DIAGNOSIS — R739 Hyperglycemia, unspecified: Secondary | ICD-10-CM

## 2024-05-27 DIAGNOSIS — Z6838 Body mass index (BMI) 38.0-38.9, adult: Secondary | ICD-10-CM | POA: Diagnosis not present

## 2024-05-27 DIAGNOSIS — M5416 Radiculopathy, lumbar region: Secondary | ICD-10-CM

## 2024-05-27 DIAGNOSIS — G894 Chronic pain syndrome: Secondary | ICD-10-CM

## 2024-05-27 DIAGNOSIS — I10 Essential (primary) hypertension: Secondary | ICD-10-CM | POA: Diagnosis not present

## 2024-05-27 DIAGNOSIS — F411 Generalized anxiety disorder: Secondary | ICD-10-CM | POA: Diagnosis not present

## 2024-05-27 MED ORDER — LEVOTHYROXINE SODIUM 100 MCG PO TABS: 100.0000 ug | ORAL_TABLET | Freq: Every day | ORAL | 3 refills | Status: DC

## 2024-05-27 MED ORDER — CELECOXIB 200 MG PO CAPS: 200.0000 mg | ORAL_CAPSULE | Freq: Every day | ORAL | 3 refills | Status: AC

## 2024-05-27 MED ORDER — BUDESONIDE 3 MG PO CPEP: 6.0000 mg | ORAL_CAPSULE | Freq: Every day | ORAL | 3 refills | Status: AC

## 2024-05-27 MED ORDER — CYCLOBENZAPRINE HCL 5 MG PO TABS: 5.0000 mg | ORAL_TABLET | Freq: Three times a day (TID) | ORAL | 1 refills | Status: AC | PRN

## 2024-05-27 MED ORDER — ROSUVASTATIN CALCIUM 40 MG PO TABS: 40.0000 mg | ORAL_TABLET | Freq: Every day | ORAL | 3 refills | Status: AC

## 2024-05-27 MED ORDER — FLUCONAZOLE 150 MG PO TABS: 150.0000 mg | ORAL_TABLET | Freq: Once | ORAL | 0 refills | Status: AC

## 2024-05-27 MED ORDER — ALPRAZOLAM 0.5 MG PO TABS: 0.5000 mg | ORAL_TABLET | Freq: Every evening | ORAL | 0 refills | Status: AC | PRN

## 2024-05-27 NOTE — Assessment & Plan Note (Signed)
 She seems euthyroid.  We will check her TFT's today.

## 2024-05-27 NOTE — Assessment & Plan Note (Signed)
 She is on crestor  40mg  daily and we will check a FLP at this time.

## 2024-05-27 NOTE — Assessment & Plan Note (Signed)
 Use tylenol as needed.

## 2024-05-27 NOTE — Assessment & Plan Note (Signed)
 She will continue on budesonide  and she is not having any GI complaints at this time.

## 2024-05-27 NOTE — Assessment & Plan Note (Signed)
 Health maintenance was discussed.  She will go next week for repeat mammogram.  We will obtain some yearly labs.

## 2024-05-27 NOTE — Assessment & Plan Note (Signed)
 Once she is over her rehab, I want her to continue to exerise and be active.  She is to eat healthy and lose weight.

## 2024-05-27 NOTE — Assessment & Plan Note (Signed)
 She is s/p lumbar fusion and has followed up with neurosurgery.  She completed inpatient rehab and is going to start outpatient rehab tomorrow. Tylenol  seems to be controlling her pain at this time.

## 2024-05-27 NOTE — Assessment & Plan Note (Signed)
 She has a history of anxiety where she will use her xanax  as needed.

## 2024-05-27 NOTE — Assessment & Plan Note (Signed)
 Plan as above.

## 2024-05-27 NOTE — Assessment & Plan Note (Signed)
 Her BP looks good today and her husband brought in her BP readings from home which runs SBP 120-130's.  I am not going to restart her other meds but continue benicar  20mg  BID.  If she needs to add on a medication, I would probably start at bystolic  with a lower dose of 5mg  daily if needed.

## 2024-05-27 NOTE — Progress Notes (Signed)
 Office Visit  Subjective   Patient ID: Jenny Yates   DOB: 1961-01-02   Age: 63 y.o.   MRN: [id number]   Chief Complaint Chief Complaint  Patient presents with   Annual Exam    Non-fasting CPE     History of Present Illness Jenny Yates is a 63 year old Caucasian/White female who presents for her annual health maintenance exam. She is due for the following health maintenance studies: mammogram and screening labs. This patient's past medical history Anemia, Anxiety Disorder, Collagenous Colitis, Depression, Herniated Lumbar Disc, Hyperlipidemia, Hypertension, Hypothyroidism, and Panic Disorder.    Her last eye exam was in 01/2024 and her vision is doing well and they are following her for cataracts. She has a history of collagenous colitis diagnosed in 2010 and had a colonoscopy at that time. She remains on budesonide  at this time. She is not having any problems with bowel movements. She did have a repeat colonoscopy in 05/2019 which showed 3 polyps. They want to repeat this colonoscopy in 7 years. Her last screening digital mammogram was done in 05/21/2023 and this was normal.  She goes next week for a repeat mammogram.  Her last Pap smear was on 11/2018 and was normal.  Her previous PAP smear was in 04/2016 and this was normal. She had a previous pap on 02/2015 and this showed was normal. Her previous PAP in 07/2012 showed LGSIL, HPV negative and her prevous PAP in 08/2013 showed ASCUS. We sent her to OB/Gyn and they did a coloposcopy at that time which was normal. She had several abnormal paps 15-20 years ago with colposcopy at that time. She had a tubal ligation right before she turned 40. She denies any problems with urination and no incontinence. She is going to start outpatient rehab this week (see below).. The patient does not smoke.  She does get yearly flu vaccines. She has had 5 COVID-19 vaccines including 3 boosters. She had a shingrix in 02/2022. There is no worsening depression or  anxiety. There is no memory loss. She is no longer on ASA 81mg  daily.   The patient presented in 01/22/2024 with a new  lumbar radiculopathy with pain radiating down especially her right buttock and leg.  I noted no weakness but she is having some numbness and tingling.  Her pain was severe at times which it has never been in the past.  I started her on a medrol  dose pak and ordered a MRI of her lumbar spine.  Insurance would not authorize the MRI so we sent her to neurosurgery for evaluation.  I did a xray of her lumbar spine on 01/25/2024 and this showed degenerative changes seen greatest at L4-L5 and L5-S1.  She did see Dr. Mavis on 02/04/2024 where he felt she had lumbar spondylolisthesis, lumbar radiculopathy, lumbar spondylosis.  He reviewed the patient's imaging studies with them and pointed out the abnormalities. He also recommended further workup with a lumbar MRI to better evaluate her spondylolisthesis and reviewed evaluate L5-S1.Presumably she has significant stenosis at L4-5.  Their insurance denied the MRI as well and the patient tells me that they were sent to physical therapy and chiropractor.  She completed physical therapy and visits with chiropractor care.  She finally had her MRI of her lumbar spine on 03/23/2024 which showed a grade 1 anterolisthesis at L4-5 with severe bilateral facet arthrosis causing severe central canal stenosis and moderate bilateral lateral recess stenosis.  They noted old left laminectomy at L5-S1 with  patent spinal canal and neural foramina.  They did take her to surgery where she had a L4-L5 fusion done on 04/23/2024.  Unfortunately she had a mechanical fall at home on 05/08/2024 where she was noted with altered mental status as well as noted to be hypotensive and was admitted to the hospital from 05/08/2024 until 05/14/2024.  Cranial CT scan showed no acute intracranial abnormality as well as follow-up MRI of the brain showing moderate generalized atrophy no acute  findings..  CT of the chest and abdomen showed a 13 x 6 mm ground glass nodule in the right lung apex recommending CT chest 6 to 12 months.  CT lumbar spine showed possible nondisplaced fracture of the left L4 transverse process with hardware intact.  Neurosurgery follow-up advised conservative care and patient was maintained in a lumbar corset applied in sitting position.  Noted admission labs unremarkable except sodium 122 BUN 29 creatinine 1.52, WBC 11,500, urinalysis positive nitrite with rare bacteria but no UTI symptoms she was initially placed on empiric antibiotics since discontinued, magnesium  1.4, ammonia level 28.  In regards to patient's hyponatremia felt to be related to poor p.o. intake and HCTZ which was discontinued.  She did receive IV fluid bolus with latest sodium 133 she did have a mild vitamin B12 deficiency.  She initially required midodrine  for hypotension which normalized and midodrine  has been discontinued.  In regards to patient's acute metabolic encephalopathy felt to be related to polypharmacy dehydration and mild AKI which has improved.  She was sent to inpatient rehab from 05/14/2024 until 05/21/2024 for continued therapies.  She had complications of a UTI wwhich was treated.  She states she has a vaginal infection today.  She is now working with outpatient PT.  She states her back pain is controlled and she currently using tylenol  as needed and occasionally takes flexeril  as needed.   The patient does have chronic pain form osteoarthitis.  We have done a lab workup for her arthritic pain which was essentially normal but her RMSF serology was abnormal and we treated her at that time.  She was also having fatigue as well as some generalized mild weakness.  She states all of her joints were hurting that is described as a dull constant aching pain that was moderate to severe at times.  She did have a tick bite on 10/30/2019 which was removed.  She denies any fevers, chills, rash, headaches  or other problems at that time.  I gave her doxycycline as described above and her above symptoms resolved.  She never did go to rheumatology.  Again, Bethzy also has a history of back pain where she had a ruptured lumbar disc.  She attributes this to a combination of weight training and a slip and fall in her bathtub in 2007.  She was seen by neurosurgery with Dr. Mavis and ultimately underwent a left L5-S1 microdiscectomy in 06/2006.  Since then she has had intermittent chronic low back pain that does not affect her daily function.  Her meloxicam was switched to celebrex  for her pain.  She has hydrocodone /APAP and valium  left over but has used this infrequently since discharge. She denies any new weakness/numbness or loss of bowel/bladder function.  The patient is a 63 year old Caucasian/White female who presents for a follow-up evaluation of hypertension.  This past year, we did switch her back to benicar  and she has been doing well since.  As above regarding her recent hospitalization, she was discovered to be hypotensive and  dehydrated where they stopped her bystolic , hydrochlorothiazide  and hydralazine .  I have tried her on Norvasc in the past but she developed edema where this medicine was stopped.  Her edema resolved and she continues to wear compression hose.  The patient has been checking her blood pressure at home and her systolic BP is running 120-130's range.  The patient's current medications include: olemisartan 20mg  BID.  Otherwise the patient denies any headache, visual changes, dizziness, lightheadness, chest pain, shortness of breath, weakness/numbness, and edema. She reports there have been no other symptoms noted.    Augustin DOROTHA Clover returns today for routine followup on her cholesterol. This past year, her cholesterol was not controlled and we changed her lipitor to crestor .  Overall, she states she is doing well and is without any complaints or problems at this time. In the past, we held  off on a statin due to her history of elevated LFT's.  She specifically denies abdominal pain, nausea, vomiting, diarrhea, or myalgias.  She is on Crestor  40mg  qhs.  She is fasting in anticipation for labs today.    The patient is a 63 year old Caucasian/White female who returns for a regularly scheduled thyroid  check. Since the last visit, there has been no overall change in her status. She remains on Synthroid  100 mcg daily. She claims to have no symptoms suggestive of thyroid  imbalance specifically denying cold intolerance, heat intolerance, anxiety, unexplained weight changes, and insomnia.     The patient returns for followup of her depression and anxiety.  The symptoms have been present for years and she describes her depression as resolved and her anxiety as mild in intensity.  She used to be on wellbutrin but stop this on her own about 5 years ago. She uses xanax  maybe 2-3 times per week.  She takes valium  for muscle spasm.  She denies difficulty concentrating, difficulty performing routine daily activities, extreme feelings of guilt, feelings of isolation, feelings of worthlessness, weight loss, insomnia, loss of appetite, social withdrawal, out of control feelings, and panic attacks. She currently lives with her husband. She has no significant prior history of mental health disorders.        Past Medical History Past Medical History:  Diagnosis Date   Anemia    Anxiety    Collagenous colitis    Depression    Hyperlipidemia    Hypertension    Hypothyroidism    Lumbar herniated disc    Panic disorder      Allergies Allergies  Allergen Reactions   Codeine Other (See Comments)    Syncope knocks me to the floor   Epinephrine  Other (See Comments)    Oral seizure-like activity   Azithromycin Rash   Iodine Swelling and Rash     Medications  Current Outpatient Medications:    ALPRAZolam  (XANAX ) 0.25 MG tablet, Take 1 tablet (0.25 mg total) by mouth at bedtime as needed for  anxiety. (Patient taking differently: Take 0.5 mg by mouth at bedtime as needed for anxiety.), Disp: , Rfl:    bisacodyl  (DULCOLAX) 10 MG suppository, Place 1 suppository (10 mg total) rectally daily as needed for moderate constipation., Disp: , Rfl:    budesonide  (ENTOCORT EC ) 3 MG 24 hr capsule, Take 2 capsules (6 mg total) by mouth daily., Disp: 60 capsule, Rfl: 0   celecoxib  (CELEBREX ) 200 MG capsule, Take 200 mg by mouth daily., Disp: , Rfl:    Cholecalciferol (VITAMIN D3 PO), Take 1,000 Units by mouth in the morning and at  bedtime., Disp: , Rfl:    cyclobenzaprine  (FLEXERIL ) 5 MG tablet, Take 5 mg by mouth 3 (three) times daily as needed for muscle spasms., Disp: , Rfl:    diazepam  (VALIUM ) 5 MG tablet, Take 5 mg by mouth daily as needed for anxiety., Disp: , Rfl:    HYDROcodone -acetaminophen  (NORCO/VICODIN) 5-325 MG tablet, Take 1 tablet by mouth daily as needed for moderate pain (pain score 4-6)., Disp: , Rfl:    levothyroxine  (SYNTHROID ) 100 MCG tablet, Take 1 tablet by mouth once daily, Disp: 30 tablet, Rfl: 0   Multiple Vitamin (MULTIVITAMIN WITH MINERALS) TABS tablet, Take 1 tablet by mouth in the morning., Disp: , Rfl:    olmesartan  (BENICAR ) 20 MG tablet, Take 1 tablet (20 mg total) by mouth in the morning and at bedtime., Disp: 180 tablet, Rfl: 3   rosuvastatin  (CRESTOR ) 40 MG tablet, Take 1 tablet (40 mg total) by mouth daily., Disp: 30 tablet, Rfl: 3   Review of Systems Review of Systems  Constitutional:  Negative for chills, fever and malaise/fatigue.  Eyes:  Negative for blurred vision and double vision.  Respiratory:  Negative for cough, hemoptysis, shortness of breath and wheezing.   Cardiovascular:  Negative for chest pain, palpitations and leg swelling.  Gastrointestinal:  Negative for abdominal pain, blood in stool, constipation, diarrhea, heartburn, melena, nausea and vomiting.  Genitourinary:  Negative for dysuria, frequency and hematuria.  Musculoskeletal:  Positive  for back pain. Negative for myalgias.  Skin:  Negative for itching and rash.  Neurological:  Negative for dizziness, weakness and headaches.  Endo/Heme/Allergies:  Negative for polydipsia.       Objective:    Vitals BP (!) 138/90   Pulse 92   Temp 97.7 F (36.5 C) (Temporal)   Resp 16   Ht 4' 11 (1.499 m)   Wt 190 lb 3.2 oz (86.3 kg)   SpO2 96%   BMI 38.42 kg/m    Physical Examination Physical Exam Constitutional:      Appearance: Normal appearance. She is not ill-appearing.  HENT:     Head: Normocephalic and atraumatic.     Right Ear: Tympanic membrane, ear canal and external ear normal.     Left Ear: Tympanic membrane, ear canal and external ear normal.     Nose: Nose normal. No congestion or rhinorrhea.     Mouth/Throat:     Mouth: Mucous membranes are moist.     Pharynx: Oropharynx is clear. No oropharyngeal exudate or posterior oropharyngeal erythema.  Eyes:     General: No scleral icterus.    Conjunctiva/sclera: Conjunctivae normal.     Pupils: Pupils are equal, round, and reactive to light.  Neck:     Vascular: No carotid bruit.  Cardiovascular:     Rate and Rhythm: Normal rate and regular rhythm.     Pulses: Normal pulses.     Heart sounds: No murmur heard.    No friction rub. No gallop.  Pulmonary:     Effort: Pulmonary effort is normal. No respiratory distress.     Breath sounds: No wheezing, rhonchi or rales.  Abdominal:     General: Bowel sounds are normal. There is no distension.     Palpations: Abdomen is soft.     Tenderness: There is no abdominal tenderness.  Musculoskeletal:     Cervical back: Normal range of motion. No tenderness.     Right lower leg: No edema.     Left lower leg: No edema.  Lymphadenopathy:  Cervical: No cervical adenopathy.  Skin:    General: Skin is warm and dry.     Findings: No rash.  Neurological:     General: No focal deficit present.     Mental Status: She is alert and oriented to person, place, and time.   Psychiatric:        Mood and Affect: Mood normal.        Behavior: Behavior normal.        Assessment & Plan:   Essential hypertension Her BP looks good today and her husband brought in her BP readings from home which runs SBP 120-130's.  I am not going to restart her other meds but continue benicar  20mg  BID.  If she needs to add on a medication, I would probably start at bystolic  with a lower dose of 5mg  daily if needed.  Collagenous colitis She will continue on budesonide  and she is not having any GI complaints at this time.  Hypothyroidism She seems euthyroid.  We will check her TFT's today.  Lumbar radiculopathy, acute She is s/p lumbar fusion and has followed up with neurosurgery.  She completed inpatient rehab and is going to start outpatient rehab tomorrow. Tylenol  seems to be controlling her pain at this time.  Primary osteoarthritis involving multiple joints Use tylenol  as needed.  Spinal stenosis of lumbar region Plan as above.  Morbid obesity (HCC) Once she is over her rehab, I want her to continue to exerise and be active.  She is to eat healthy and lose weight.  Hypercholesterolemia She is on crestor  40mg  daily and we will check a FLP at this time.  GAD (generalized anxiety disorder) She has a history of anxiety where she will use her xanax  as needed.  Chronic pain syndrome Plan as above.  BMI 38.0-38.9,adult Plan as above.  Annual physical exam Health maintenance was discussed.  She will go next week for repeat mammogram.  We will obtain some yearly labs.    Return in about 3 months (around 08/25/2024).   Selinda Fleeta Finger, MD

## 2024-05-28 LAB — HEMOGLOBIN A1C

## 2024-05-28 LAB — LIPID PANEL
Chol/HDL Ratio: 2.6 ratio (ref 0.0–4.4)
Cholesterol, Total: 151 mg/dL (ref 100–199)
HDL: 58 mg/dL (ref >39)
LDL Chol Calc (NIH): 71 mg/dL (ref 0–99)
Triglycerides: 126 mg/dL (ref 0–149)
VLDL Cholesterol Cal: 22 mg/dL (ref 5–40)

## 2024-05-28 LAB — TSH: TSH: 2.58 u[IU]/mL (ref 0.450–4.500)

## 2024-05-28 LAB — T4, FREE: Free T4: 1.7 ng/dL (ref 0.82–1.77)

## 2024-05-30 ENCOUNTER — Inpatient Hospital Stay: Admission: RE | Admit: 2024-05-30 | Discharge: 2024-05-30 | Attending: Internal Medicine | Admitting: Internal Medicine

## 2024-05-30 DIAGNOSIS — Z1231 Encounter for screening mammogram for malignant neoplasm of breast: Secondary | ICD-10-CM

## 2024-06-01 ENCOUNTER — Other Ambulatory Visit: Payer: Self-pay | Admitting: Internal Medicine

## 2024-06-03 ENCOUNTER — Other Ambulatory Visit: Payer: Self-pay | Admitting: Internal Medicine

## 2024-06-03 DIAGNOSIS — M4316 Spondylolisthesis, lumbar region: Secondary | ICD-10-CM | POA: Diagnosis not present

## 2024-06-03 DIAGNOSIS — T148XXA Other injury of unspecified body region, initial encounter: Secondary | ICD-10-CM | POA: Diagnosis not present

## 2024-06-03 MED ORDER — DIAZEPAM 5 MG PO TABS: 5.0000 mg | ORAL_TABLET | Freq: Two times a day (BID) | ORAL | 1 refills | Status: AC | PRN

## 2024-06-03 MED ORDER — HYDROCODONE-ACETAMINOPHEN 5-325 MG PO TABS: 1.0000 | ORAL_TABLET | Freq: Every day | ORAL | 0 refills | Status: AC | PRN

## 2024-06-04 ENCOUNTER — Other Ambulatory Visit: Payer: Self-pay | Admitting: Internal Medicine

## 2024-06-10 ENCOUNTER — Ambulatory Visit: Payer: Self-pay

## 2024-06-10 NOTE — Progress Notes (Signed)
 Patient called.  Patient aware.  Per Dr. Fleeta Finger Her labs look good, .

## 2024-06-10 NOTE — Progress Notes (Signed)
 Patient called.  Left message for patient to call back.  Per Dr. Fleeta Finger Her labs look good, .

## 2024-07-07 ENCOUNTER — Ambulatory Visit: Payer: Self-pay | Admitting: *Deleted

## 2024-07-07 NOTE — Telephone Encounter (Signed)
 FYI Only or Action Required?: FYI only for provider: UC advised .  Patient was last seen in primary care on 05/27/2024 by Fleeta Valeria Mayo, MD.  Called Nurse Triage reporting Fatigue (Not eating, confusion, lethargy ).  Symptoms began several weeks ago.  Interventions attempted: Rest, hydration, or home remedies.  Symptoms are: gradually worsening.  Triage Disposition: See HCP Within 4 Hours (Or PCP Triage)  Patient/caregiver understands and will follow disposition?: Yes  Patient's husband is calling- patient needs NP appointment- but has high risk symptoms now. I did schedule appointment for NP establish care- but advised husband- UC is needed for immediate concerns and evaluation. He states he will take her.   Copied from CRM #[id number]. Topic: Clinical - Red Word Triage >> Jul 07, 2024  1:02 PM Delon DASEN wrote: Red Word that prompted transfer to Nurse Triage: back pain, confusion, lethargic, not eating, dehydration, constipation, vomiting Reason for Disposition  [1] MODERATE weakness (e.g., interferes with work, school, normal activities) AND [2] cause unknown  (Exceptions: Weakness from acute minor illness or poor fluid intake; weakness is chronic and not worse.)  Answer Assessment - Initial Assessment Questions 1. DESCRIPTION: Describe how you are feeling.     Fatigue, weakness, nausea/vomiting 2. SEVERITY: How bad is it?  Can you stand and walk?     Severe, mobility- decreased within past 2 weeks 3. ONSET: When did these symptoms begin? (e.g., hours, days, weeks, months)     Worse over last 2 weeks 4. CAUSE: What do you think is causing the weakness or fatigue? (e.g., not drinking enough fluids, medical problem, trouble sleeping)     Not drinking enough, medical problem 5. NEW MEDICINES:  Have you started on any new medicines recently? (e.g., opioid pain medicines, benzodiazepines, muscle relaxants, antidepressants, antihistamines, neuroleptics, beta blockers)     BP  medication stopped at hospital 6. OTHER SYMPTOMS: Do you have any other symptoms? (e.g., chest pain, fever, cough, SOB, vomiting, diarrhea, bleeding, other areas of pain)     Constipation, on/off- nausea/vomiting, confusion- hallucination- auditory, sometimes seeing things  Protocols used: Weakness (Generalized) and Fatigue-A-AH

## 2024-07-11 ENCOUNTER — Encounter (HOSPITAL_COMMUNITY): Payer: Self-pay

## 2024-07-11 ENCOUNTER — Inpatient Hospital Stay (HOSPITAL_COMMUNITY): Admission: EM | Admit: 2024-07-11 | Discharge: 2024-07-14 | DRG: 640 | Disposition: A | Discharge status: Home Health

## 2024-07-11 ENCOUNTER — Emergency Department (HOSPITAL_COMMUNITY)

## 2024-07-11 ENCOUNTER — Other Ambulatory Visit: Payer: Self-pay

## 2024-07-11 DIAGNOSIS — Z6841 Body Mass Index (BMI) 40.0 and over, adult: Secondary | ICD-10-CM | POA: Diagnosis not present

## 2024-07-11 DIAGNOSIS — R911 Solitary pulmonary nodule: Secondary | ICD-10-CM | POA: Diagnosis present

## 2024-07-11 DIAGNOSIS — N179 Acute kidney failure, unspecified: Secondary | ICD-10-CM | POA: Diagnosis present

## 2024-07-11 DIAGNOSIS — R300 Dysuria: Secondary | ICD-10-CM | POA: Diagnosis present

## 2024-07-11 DIAGNOSIS — Z806 Family history of leukemia: Secondary | ICD-10-CM

## 2024-07-11 DIAGNOSIS — E441 Mild protein-calorie malnutrition: Secondary | ICD-10-CM | POA: Diagnosis present

## 2024-07-11 DIAGNOSIS — S32049A Unspecified fracture of fourth lumbar vertebra, initial encounter for closed fracture: Secondary | ICD-10-CM | POA: Diagnosis present

## 2024-07-11 DIAGNOSIS — E785 Hyperlipidemia, unspecified: Secondary | ICD-10-CM | POA: Diagnosis present

## 2024-07-11 DIAGNOSIS — E876 Hypokalemia: Secondary | ICD-10-CM

## 2024-07-11 DIAGNOSIS — K5900 Constipation, unspecified: Secondary | ICD-10-CM | POA: Diagnosis present

## 2024-07-11 DIAGNOSIS — Z7401 Bed confinement status: Secondary | ICD-10-CM | POA: Diagnosis not present

## 2024-07-11 DIAGNOSIS — G928 Other toxic encephalopathy: Secondary | ICD-10-CM | POA: Diagnosis present

## 2024-07-11 DIAGNOSIS — F410 Panic disorder [episodic paroxysmal anxiety] without agoraphobia: Secondary | ICD-10-CM | POA: Diagnosis present

## 2024-07-11 DIAGNOSIS — Z8051 Family history of malignant neoplasm of kidney: Secondary | ICD-10-CM

## 2024-07-11 DIAGNOSIS — R509 Fever, unspecified: Secondary | ICD-10-CM | POA: Diagnosis not present

## 2024-07-11 DIAGNOSIS — E860 Dehydration: Secondary | ICD-10-CM | POA: Diagnosis present

## 2024-07-11 DIAGNOSIS — E669 Obesity, unspecified: Secondary | ICD-10-CM | POA: Diagnosis present

## 2024-07-11 DIAGNOSIS — T380X5A Adverse effect of glucocorticoids and synthetic analogues, initial encounter: Secondary | ICD-10-CM | POA: Diagnosis present

## 2024-07-11 DIAGNOSIS — Z881 Allergy status to other antibiotic agents status: Secondary | ICD-10-CM

## 2024-07-11 DIAGNOSIS — Z79899 Other long term (current) drug therapy: Secondary | ICD-10-CM

## 2024-07-11 DIAGNOSIS — E242 Drug-induced Cushing's syndrome: Secondary | ICD-10-CM | POA: Diagnosis present

## 2024-07-11 DIAGNOSIS — I10 Essential (primary) hypertension: Secondary | ICD-10-CM | POA: Diagnosis present

## 2024-07-11 DIAGNOSIS — T502X5A Adverse effect of carbonic-anhydrase inhibitors, benzothiadiazides and other diuretics, initial encounter: Secondary | ICD-10-CM | POA: Diagnosis present

## 2024-07-11 DIAGNOSIS — Z860100 Personal history of colon polyps, unspecified: Secondary | ICD-10-CM

## 2024-07-11 DIAGNOSIS — Z1152 Encounter for screening for COVID-19: Secondary | ICD-10-CM | POA: Diagnosis not present

## 2024-07-11 DIAGNOSIS — G8929 Other chronic pain: Secondary | ICD-10-CM | POA: Diagnosis present

## 2024-07-11 DIAGNOSIS — G9341 Metabolic encephalopathy: Principal | ICD-10-CM

## 2024-07-11 DIAGNOSIS — Z79890 Hormone replacement therapy (postmenopausal): Secondary | ICD-10-CM | POA: Diagnosis not present

## 2024-07-11 DIAGNOSIS — K529 Noninfective gastroenteritis and colitis, unspecified: Secondary | ICD-10-CM | POA: Diagnosis present

## 2024-07-11 DIAGNOSIS — E873 Alkalosis: Secondary | ICD-10-CM

## 2024-07-11 DIAGNOSIS — Z8616 Personal history of COVID-19: Secondary | ICD-10-CM

## 2024-07-11 DIAGNOSIS — F32A Depression, unspecified: Secondary | ICD-10-CM | POA: Diagnosis present

## 2024-07-11 DIAGNOSIS — E039 Hypothyroidism, unspecified: Secondary | ICD-10-CM | POA: Diagnosis present

## 2024-07-11 DIAGNOSIS — Z888 Allergy status to other drugs, medicaments and biological substances status: Secondary | ICD-10-CM

## 2024-07-11 DIAGNOSIS — Z885 Allergy status to narcotic agent status: Secondary | ICD-10-CM

## 2024-07-11 DIAGNOSIS — Z981 Arthrodesis status: Secondary | ICD-10-CM

## 2024-07-11 DIAGNOSIS — E871 Hypo-osmolality and hyponatremia: Secondary | ICD-10-CM | POA: Diagnosis present

## 2024-07-11 DIAGNOSIS — Z791 Long term (current) use of non-steroidal anti-inflammatories (NSAID): Secondary | ICD-10-CM

## 2024-07-11 DIAGNOSIS — Z7952 Long term (current) use of systemic steroids: Secondary | ICD-10-CM

## 2024-07-11 DIAGNOSIS — Z91041 Radiographic dye allergy status: Secondary | ICD-10-CM

## 2024-07-11 DIAGNOSIS — R251 Tremor, unspecified: Secondary | ICD-10-CM | POA: Diagnosis present

## 2024-07-11 LAB — COMPREHENSIVE METABOLIC PANEL WITH GFR
ALT: 23 U/L (ref 0–44)
ALT: 19 U/L (ref 0–44)
ALT: 17 U/L (ref 0–44)
AST: 56 U/L — ABNORMAL HIGH (ref 15–41)
AST: 44 U/L — ABNORMAL HIGH (ref 15–41)
AST: 44 U/L — ABNORMAL HIGH (ref 15–41)
Albumin: 3.3 g/dL — ABNORMAL LOW (ref 3.5–5.0)
Albumin: 2.7 g/dL — ABNORMAL LOW (ref 3.5–5.0)
Albumin: 2.7 g/dL — ABNORMAL LOW (ref 3.5–5.0)
Alkaline Phosphatase: 126 U/L (ref 38–126)
Alkaline Phosphatase: 102 U/L (ref 38–126)
Alkaline Phosphatase: 107 U/L (ref 38–126)
Anion gap: 10 (ref 5–15)
Anion gap: 7 (ref 5–15)
Anion gap: 8 (ref 5–15)
BUN: 15 mg/dL (ref 8–23)
BUN: 15 mg/dL (ref 8–23)
BUN: 15 mg/dL (ref 8–23)
CO2: 34 mmol/L — ABNORMAL HIGH (ref 22–32)
CO2: 33 mmol/L — ABNORMAL HIGH (ref 22–32)
CO2: 30 mmol/L (ref 22–32)
Calcium: >15 mg/dL (ref 8.9–10.3)
Calcium: 14.4 mg/dL (ref 8.9–10.3)
Calcium: 14 mg/dL (ref 8.9–10.3)
Chloride: 90 mmol/L — ABNORMAL LOW (ref 98–111)
Chloride: 95 mmol/L — ABNORMAL LOW (ref 98–111)
Chloride: 97 mmol/L — ABNORMAL LOW (ref 98–111)
Creatinine, Ser: 1.39 mg/dL — ABNORMAL HIGH (ref 0.44–1.00)
Creatinine, Ser: 1.22 mg/dL — ABNORMAL HIGH (ref 0.44–1.00)
Creatinine, Ser: 1.35 mg/dL — ABNORMAL HIGH (ref 0.44–1.00)
GFR, Estimated: 42 mL/min — ABNORMAL LOW
GFR, Estimated: 50 mL/min — ABNORMAL LOW
GFR, Estimated: 44 mL/min — ABNORMAL LOW
Glucose, Bld: 111 mg/dL — ABNORMAL HIGH (ref 70–99)
Glucose, Bld: 99 mg/dL (ref 70–99)
Glucose, Bld: 96 mg/dL (ref 70–99)
Potassium: 2.5 mmol/L — CL (ref 3.5–5.1)
Potassium: 2.3 mmol/L — CL (ref 3.5–5.1)
Potassium: 2.9 mmol/L — ABNORMAL LOW (ref 3.5–5.1)
Sodium: 134 mmol/L — ABNORMAL LOW (ref 135–145)
Sodium: 134 mmol/L — ABNORMAL LOW (ref 135–145)
Sodium: 136 mmol/L (ref 135–145)
Total Bilirubin: 0.6 mg/dL (ref 0.0–1.2)
Total Bilirubin: 0.4 mg/dL (ref 0.0–1.2)
Total Bilirubin: 0.4 mg/dL (ref 0.0–1.2)
Total Protein: 6.4 g/dL — ABNORMAL LOW (ref 6.5–8.1)
Total Protein: 5.2 g/dL — ABNORMAL LOW (ref 6.5–8.1)
Total Protein: 5.3 g/dL — ABNORMAL LOW (ref 6.5–8.1)

## 2024-07-11 LAB — RESP PANEL BY RT-PCR (RSV, FLU A&B, COVID)  RVPGX2
Influenza A by PCR: NEGATIVE
Influenza B by PCR: NEGATIVE
Resp Syncytial Virus by PCR: NEGATIVE
SARS Coronavirus 2 by RT PCR: NEGATIVE

## 2024-07-11 LAB — URINALYSIS, ROUTINE W REFLEX MICROSCOPIC
Bilirubin Urine: NEGATIVE
Glucose, UA: NEGATIVE mg/dL
Hgb urine dipstick: NEGATIVE
Ketones, ur: NEGATIVE mg/dL
Nitrite: NEGATIVE
Protein, ur: 100 mg/dL — AB
Specific Gravity, Urine: 1.015 (ref 1.005–1.030)
WBC, UA: >50 WBC/hpf (ref 0–5)
pH: 6 (ref 5.0–8.0)

## 2024-07-11 LAB — CBC
HCT: 37.9 % (ref 36.0–46.0)
Hemoglobin: 12.2 g/dL (ref 12.0–15.0)
MCH: 29.5 pg (ref 26.0–34.0)
MCHC: 32.2 g/dL (ref 30.0–36.0)
MCV: 91.5 fL (ref 80.0–100.0)
Platelets: 309 K/uL (ref 150–400)
RBC: 4.14 MIL/uL (ref 3.87–5.11)
RDW: 15.3 % (ref 11.5–15.5)
WBC: 7.6 K/uL (ref 4.0–10.5)
nRBC: 0 % (ref 0.0–0.2)

## 2024-07-11 LAB — I-STAT VENOUS BLOOD GAS, ED
Acid-Base Excess: 10 mmol/L — ABNORMAL HIGH (ref 0.0–2.0)
Bicarbonate: 34.3 mmol/L — ABNORMAL HIGH (ref 20.0–28.0)
Calcium, Ion: 1.9 mmol/L (ref 1.15–1.40)
HCT: 29 % — ABNORMAL LOW (ref 36.0–46.0)
Hemoglobin: 9.9 g/dL — ABNORMAL LOW (ref 12.0–15.0)
O2 Saturation: 90 %
Potassium: 2.3 mmol/L — CL (ref 3.5–5.1)
Sodium: 135 mmol/L (ref 135–145)
TCO2: 36 mmol/L — ABNORMAL HIGH (ref 22–32)
pCO2, Ven: 45.8 mmHg (ref 44–60)
pH, Ven: 7.481 — ABNORMAL HIGH (ref 7.25–7.43)
pO2, Ven: 56 mmHg — ABNORMAL HIGH (ref 32–45)

## 2024-07-11 LAB — MAGNESIUM: Magnesium: 2 mg/dL (ref 1.7–2.4)

## 2024-07-11 LAB — VITAMIN D 25 HYDROXY (VIT D DEFICIENCY, FRACTURES): Vit D, 25-Hydroxy: 64.2 ng/mL (ref 30–100)

## 2024-07-11 LAB — TSH: TSH: 1.94 u[IU]/mL (ref 0.350–4.500)

## 2024-07-11 LAB — CORTISOL: Cortisol, Plasma: 1.7 ug/dL

## 2024-07-11 LAB — TROPONIN T, HIGH SENSITIVITY
Troponin T High Sensitivity: 85 ng/L — ABNORMAL HIGH (ref 0–19)
Troponin T High Sensitivity: 79 ng/L — ABNORMAL HIGH (ref 0–19)

## 2024-07-11 LAB — CBG MONITORING, ED: Glucose-Capillary: 117 mg/dL — ABNORMAL HIGH (ref 70–99)

## 2024-07-11 LAB — LIPASE, BLOOD: Lipase: 21 U/L (ref 11–51)

## 2024-07-11 MED ORDER — CALCITONIN (SALMON) 200 UNIT/ML IJ SOLN
4.0000 [IU]/kg | Freq: Two times a day (BID) | INTRAMUSCULAR | Status: AC
  Administered 2024-07-11 – 2024-07-12 (×4): 370 [IU] via INTRAMUSCULAR
  Filled 2024-07-11 (×4): qty 1.85

## 2024-07-11 MED ORDER — ROSUVASTATIN CALCIUM 20 MG PO TABS
40.0000 mg | ORAL_TABLET | Freq: Every day | ORAL | Status: DC
  Administered 2024-07-11 – 2024-07-13 (×3): 40 mg via ORAL
  Filled 2024-07-11 (×3): qty 2

## 2024-07-11 MED ORDER — SODIUM CHLORIDE 0.9 % IV BOLUS
1000.0000 mL | Freq: Once | INTRAVENOUS | Status: AC
  Administered 2024-07-11: 1000 mL via INTRAVENOUS

## 2024-07-11 MED ORDER — ZOLEDRONIC ACID 4 MG/100ML IV SOLN
4.0000 mg | Freq: Once | INTRAVENOUS | Status: DC
  Filled 2024-07-11: qty 100

## 2024-07-11 MED ORDER — ENOXAPARIN SODIUM 40 MG/0.4ML IJ SOSY
40.0000 mg | PREFILLED_SYRINGE | INTRAMUSCULAR | Status: DC
  Administered 2024-07-11 – 2024-07-14 (×4): 40 mg via SUBCUTANEOUS
  Filled 2024-07-11 (×4): qty 0.4

## 2024-07-11 MED ORDER — ACETAMINOPHEN 500 MG PO TABS
500.0000 mg | ORAL_TABLET | Freq: Once | ORAL | Status: AC
  Administered 2024-07-11: 500 mg via ORAL
  Filled 2024-07-11: qty 1

## 2024-07-11 MED ORDER — LEVOTHYROXINE SODIUM 100 MCG PO TABS
100.0000 ug | ORAL_TABLET | Freq: Every day | ORAL | Status: DC
  Administered 2024-07-12 – 2024-07-14 (×3): 100 ug via ORAL
  Filled 2024-07-11: qty 1
  Filled 2024-07-11: qty 2
  Filled 2024-07-11: qty 1

## 2024-07-11 MED ORDER — POTASSIUM CHLORIDE CRYS ER 20 MEQ PO TBCR
40.0000 meq | EXTENDED_RELEASE_TABLET | ORAL | Status: AC
  Filled 2024-07-11: qty 2

## 2024-07-11 MED ORDER — ZOLEDRONIC ACID 4 MG/100ML IV SOLN
4.0000 mg | Freq: Once | INTRAVENOUS | Status: AC
  Administered 2024-07-11: 4 mg via INTRAVENOUS
  Filled 2024-07-11: qty 100

## 2024-07-11 MED ORDER — POTASSIUM CHLORIDE 20 MEQ PO PACK
40.0000 meq | PACK | ORAL | Status: AC
  Administered 2024-07-11 (×3): 40 meq via ORAL
  Filled 2024-07-11 (×3): qty 2

## 2024-07-11 MED ORDER — ACETAMINOPHEN 500 MG PO TABS
1000.0000 mg | ORAL_TABLET | Freq: Three times a day (TID) | ORAL | Status: DC | PRN
  Administered 2024-07-12: 1000 mg via ORAL
  Filled 2024-07-11: qty 2

## 2024-07-11 MED ORDER — POTASSIUM CHLORIDE 10 MEQ/100ML IV SOLN
10.0000 meq | INTRAVENOUS | Status: AC
  Administered 2024-07-11 (×2): 10 meq via INTRAVENOUS
  Filled 2024-07-11 (×2): qty 100

## 2024-07-11 MED ORDER — POTASSIUM CHLORIDE 10 MEQ/100ML IV SOLN
10.0000 meq | INTRAVENOUS | Status: AC
  Administered 2024-07-11 (×4): 10 meq via INTRAVENOUS
  Filled 2024-07-11 (×4): qty 100

## 2024-07-11 MED ORDER — LEVOTHYROXINE SODIUM 100 MCG PO TABS: 100.0000 ug | ORAL_TABLET | Freq: Every day | ORAL | Status: DC

## 2024-07-11 MED ORDER — POTASSIUM CHLORIDE 10 MEQ/100ML IV SOLN
10.0000 meq | INTRAVENOUS | Status: DC
  Administered 2024-07-11: 10 meq via INTRAVENOUS
  Filled 2024-07-11: qty 100

## 2024-07-11 MED ORDER — LACTATED RINGERS IV SOLN: INTRAVENOUS | Status: AC

## 2024-07-11 NOTE — ED Notes (Signed)
 Pt SpO2 at 82% on room. Pt placed on 2L [place]. Hatcher MD and bedside and aware.

## 2024-07-11 NOTE — ED Notes (Signed)
 Pt assisted to restroom in wheelchair to provide urine sample in triage, attempt unsuccessful.

## 2024-07-11 NOTE — Progress Notes (Signed)
 CRITICAL VALUE STICKER  CRITICAL VALUE: Calcium  14  RECEIVER (on-site recipient of call): Rocky CHRISTELLA Latch   DATE & TIME NOTIFIED: 07/11/2024 1759   MESSENGER (representative from lab):[employer]. P  MD NOTIFIED: C. Juberg DO notified  TIME OF NOTIFICATION: 1801  RESPONSE:  no new orders Value is trending down

## 2024-07-11 NOTE — ED Triage Notes (Addendum)
 Patient had spinal surgery a few months ago. She fell a few weeks later and was admitted to the hospital. Patient was discharged Thanksgiving per husband. Since then she has declined with low PO intake, more confused, unable to perform daily actives. Husband states she has become like a total care patient. Husband reports that she had a fall yesterday. Did not hit her head. No LOC. Reports some vomiting, cough. States dizziness comes and goes for weeks.

## 2024-07-11 NOTE — H&P (Addendum)
 " Date: 07/11/2024               Patient Name:  Jenny Yates MRN: [id number]  DOB: Nov 20, 1960 Age / Sex: 64 y.o., female   PCP: Pcp, No         Medical Service: Internal Medicine Teaching Service         Attending Physician: Dr. Eben Reyes BROCKS, MD      First Contact: Dr. Doyal Miyamoto, MD    Second Contact: Dr. Lonni Africa, DO         After Hours (After 5p/  First Contact Pager: [phone]  weekends / holidays): Second Contact Pager: [phone]   SUBJECTIVE   Chief Complaint: Altered mental status  History of Present Illness:  Jenny Yates is a 64 year old female with past medical history of hypertension, hypothyroidism, collagenous colitis, hyperlipidemia, normocytic anemia, anxiety/depression with panic disorder, and herniated lumbar disc s/p L4/L5 fusion on 04/23/2024 who presents with several weeks of persistent altered mental status and a few weeks of severely decreased oral intake.  The patient is able to provide some of the history but most of the history is provided by the patient's husband.  After her lumbar surgery in October that was complicated by prolonged anesthesia event over 3 hours she has had significant decline in her functional status and has not returned to baseline.  She previously was very independent, active, and functional.  For the past 3 months she has been very immobile due to pain and fatigue, has had intermittent confusion with occasional auditory hallucinations, and over the past 2-3 weeks has had a significantly decreased appetite.  She has had a long history of pill dysphagia but otherwise denies any other new dysphagia or worsening dysphagia.  She does not have interest in food but her husband has been helping her eat and making sure that she drinks 30-40 ounces of fluids a day, mainly water.  She denies any new focal weakness but has been generally weak over the past few months.  She usually has 1-2 bowel movements a day with her history of collagenous  colitis and has been off of her budesonide  since her surgery in October.  More recently she has had a bowel movement every 48 hours without any melena or blood.  She denies any abdominal pain, nausea, or vomiting outside of 2 episodes of pill dysphagia induced vomiting.  Since her surgery she has had a few of her medications held including her olmesartan  and she recently resumed hydrochlorothiazide  at home.  She is also not taking her as needed benzodiazepines since before the surgery and is drinking significantly less alcohol since her surgery in October.  Otherwise no noted associated symptoms including new or worsening headache, neck pain or swelling, fevers, chest pain, shortness of breath, heart palpitations, changes in urinary habits, increased lower extremity edema.  ED Course: Presented as above hypertensive and tachycardic at 133/108 and 100-110 respectively, tachypneic, afebrile, and satting well on room air.  Metabolic panel showed severe hypercalcemia with calcium  level above 15, hypokalemia with potassium of 2.5, mild metabolic alkalosis with a bicarb of 34, AKI with a creatinine of 1.39 up from baseline of 0.7-0.8, mildly elevated AST at 56 with normal alkaline phosphatase, lipase, ALT, and bilirubin.  CBC within normal limits, negative for flu/COVID/RSV, and chest x-ray without significant abnormality.  EKG showed sinus tachycardia with normal QT interval.  Past Medical History Hypertension Hypothyroidism Hyperlipidemia Normocytic anemia Collagenous colitis Anxiety and depression with panic disorder Chronic back  pain with multiple back surgeries in the lumbar region  Meds:  Levothyroxine  100 mcg daily Hydrochlorothiazide  25 mg daily Celecoxib  200 mg daily Colecalciferol 2000 units daily Rosuvastatin  40 mg daily Magnesium  oxide 250 mg daily Daily multivitamin Fluticasone  nasal spray as needed Cyclobenzaprine  5 mg 3 times daily as needed  -Medications on hold or not recently  taken (2-3 months) Olmesartan  20 mg twice daily Alprazolam  0.5 mg nightly as needed Diazepam  5 mg every 12 hours as needed Hydrocodone /acetaminophen  5/325 mg daily as needed Budesonide  6 mg daily  -Medication possibly on hold with recent fill history but not noted on patient's home medication list provided by husband Nebivolol  20 mg daily  Past Surgical History L4/5 lumbar fusion 03/2024 Past Surgical History:  Procedure Laterality Date   BACK SURGERY     2007   HERNIA REPAIR     TUBAL LIGATION      Social:  Lives at home with husband and previously was independent ADLs and IADLs until lumbar surgery in 03/2024.  Now dependent in some ADLs and IADLs, walks with a walker.  Retired but worked previously as a CPA. PCP: Selinda Fleeta Finger, MD Substances: 9-pack-year history, quit in 1988.  3-4 glasses of wine daily until November 2025, no alcohol use since then.  No current or prior drug use.  Family History:  Mother with GI/renal cancer Brother or uncle with leukemia Family History  Problem Relation Age of Onset   Breast cancer Neg Hx     Allergies: Allergies as of 07/11/2024 - Review Complete 07/11/2024  Allergen Reaction Noted   Codeine Other (See Comments) 07/24/2022   Epinephrine  Other (See Comments) 07/24/2022   Azithromycin Rash 07/24/2022   Iodine Swelling and Rash 07/24/2022    Review of Systems: A complete ROS was negative except as per HPI.   OBJECTIVE:   Physical Exam: Blood pressure (!) 136/102, pulse (!) 103, temperature 98.3 F (36.8 C), temperature source Oral, resp. rate (!) 23, SpO2 95%.  Constitutional: Ill-appearing adult female laying in bed. In no acute distress. HENT: Normocephalic, atraumatic,  Eyes: Sclera non-icteric, PERRL, EOM intact Cardio:Regular rate and rhythm. 2+ bilateral radial and dorsalis pedis  pulses. Pulm: No abnormal lung sounds identified, difficult exam due to body habitus. Normal work of breathing on room air. Abdomen: Soft,  non-tender, non-distended, positive bowel sounds. MSK/Skin: 1+ pitting edema in the lower extremities bilaterally with overlying erythema on the right lower extremity between the knee and ankle without increased warmth Neuro:Alert and oriented x3. No focal deficit noted but diffuse mild weakness in upper and lower extremities. Psych:Pleasant mood and affect.  No active hallucinations.  Labs: CBC    Component Value Date/Time   WBC 7.6 07/11/2024 0523   RBC 4.14 07/11/2024 0523   HGB 12.2 07/11/2024 0523   HGB 12.8 05/23/2023 0949   HCT 37.9 07/11/2024 0523   HCT 39.7 05/23/2023 0949   PLT 309 07/11/2024 0523   PLT 228 05/23/2023 0949   MCV 91.5 07/11/2024 0523   MCV 103 (H) 05/23/2023 0949   MCH 29.5 07/11/2024 0523   MCHC 32.2 07/11/2024 0523   RDW 15.3 07/11/2024 0523   RDW 12.6 05/23/2023 0949   LYMPHSABS 2.1 05/15/2024 0442   LYMPHSABS 2.6 05/23/2023 0949   MONOABS 1.4 (H) 05/15/2024 0442   EOSABS 0.1 05/15/2024 0442   EOSABS 0.1 05/23/2023 0949   BASOSABS 0.1 05/15/2024 0442   BASOSABS 0.0 05/23/2023 0949     CMP     Component Value Date/Time  NA 134 (L) 07/11/2024 0523   NA 139 12/10/2023 1054   K 2.5 (LL) 07/11/2024 0523   CL 90 (L) 07/11/2024 0523   CO2 34 (H) 07/11/2024 0523   GLUCOSE 111 (H) 07/11/2024 0523   BUN 15 07/11/2024 0523   BUN 29 (H) 12/10/2023 1054   CREATININE 1.39 (H) 07/11/2024 0523   CALCIUM  >15.0 (HH) 07/11/2024 0523   PROT 6.4 (L) 07/11/2024 0523   PROT 6.4 12/10/2023 1054   ALBUMIN  3.3 (L) 07/11/2024 0523   ALBUMIN  4.4 12/10/2023 1054   AST 56 (H) 07/11/2024 0523   ALT 23 07/11/2024 0523   ALKPHOS 126 07/11/2024 0523   BILITOT 0.6 07/11/2024 0523   BILITOT 0.4 12/10/2023 1054   GFRNONAA 42 (L) 07/11/2024 0523    Imaging: DG Chest Portable 1 View Result Date: 07/11/2024 CLINICAL DATA:  Cough EXAM: PORTABLE CHEST 1 VIEW COMPARISON:  May 14, 2024 FINDINGS: Stable cardiomediastinal silhouette. Both lungs are clear. The  visualized skeletal structures are unremarkable. IMPRESSION: No active disease. Electronically Signed   By: Lynwood Landy Raddle M.D.   On: 07/11/2024 07:47     EKG: personally reviewed my interpretation is sinus tachycardia. Consistent with prior EKG but faster.  ASSESSMENT & PLAN:   Assessment & Plan by Problem: Principal Problem:   Hypercalcemia Active Problems:   Essential hypertension   Hypothyroidism   AKI (acute kidney injury)   Hyponatremia   Metabolic alkalosis   Jenny Yates is a 64 y.o. female with pertinent PMH of hypertension, hypothyroidism, collagenous colitis, hyperlipidemia, normocytic anemia, anxiety/depression with panic disorder, and herniated lumbar disc s/p L4/L5 fusion on 04/23/2024 who presented with several weeks of altered mental status and significantly decreased p.o. intake for the past few weeks and is admitted for severe hypercalcemia.  Hypercalcemia Initial potassium metabolic panel over 15.  Signs/symptoms include anorexia, decreased bowel output, generalized weakness, confusion, fatigue, decreased concentration, and AKI.  No significant EKG changes, bradycardia, or severe hypertension.  Most likely cause is immobilization in combination with thiazide diuretic versus parathyroid abnormality, malignancy, and possibly adrenal insufficiency.  Vitamin D  level normal, TSH normal, no prior history of hypercalcemia, no lithium use, no identifiable increased exogenous calcium  intake.  No prior PTH level.  Up-to-date on cancer screenings with colonoscopy in 2020 and planned repeat in 2027 after 3 polyps with unknown (to me) pathology were found, normal mammogram 05/2024, and prior abnormal Pap in 1983 s/p cryo procedure and normal Paps following.  Nulliparous, still has her uterus, no abnormal uterine bleeding with last menstrual period 01/2014.  Does have a family history of GI/renal cancer in her mother.  Difficult to tell if she has had definite B symptoms after significant  decline following surgery in October 2025.  Lower suspicion for adrenal insufficiency with normal blood pressure but she was on an oral steroid chronically however this has been held for about 3 months. - S/p 2 L bolus normal saline, then 200 mL an hour, target urine output 100-150 mL an hour - Repeat stat metabolic panel, if calcium  still above 14 we will give IV bisphosphonate and calcitriol - Zoledronic  acid 4 mg IV once - Calcitriol 4 units/kg twice daily for 4 doses - PTH, ionized calcium  (send out), random cortisol, UA - Consider nephrology consult and if PTH is low  Hypokalemia Likely caused by poor oral intake and thiazide diuretic use.  Potassium 2.5 on arrival.  Received 20 mill equivalents IV potassium already.  Magnesium  2.0.  We will be aggressive with  our potassium repletion with our frequent metabolic panels to monitor her calcium  and in the setting of potassium free IV fluids. - 5X IV potassium chloride  10 mEq - 40 mill equivalents oral potassium chloride  every 4 hours for 3 doses  AKI Metabolic alkalosis Creatinine increased from baseline of 0.7-0.8 to 1.39 in the setting of decreased oral intake, severe hypercalcemia, and continue thiazide diuretic use.  She also has a mild metabolic alkalosis with a bicarb of 35.  With her altered mentation and severe electrolyte abnormalities we will get a blood gas but I expect this to resolve with treatments as above. - Urinalysis - VBG - IV fluids as above - Hold NSAIDs and other nephrotoxic meds - If no improvement in AKI today we will get a renal ultrasound and discussed with nephrology  Hypertension Mildly hypertensive to normotensive here.  We will hold and discontinue her thiazide diuretic and hold her other antihypertensives which appear to be just olmesartan  but possibly also Nebivolol . - Telemetry - Page for SBP above 180  Hypothyroidism Through chart review unknown cause of her hypothyroidism but TSH here 1.94. - Continue  levothyroxine  100 mcg daily, first dose tomorrow  Anxiety/depression with panic disorder Due to the possible neuropsychiatric effects of her severe hypercalcemia and since she has not been taking her benzodiazepines at home we will not resume this at this point but from her history provided by her husband she has not had any success from SSRIs but Wellbutrin and benzodiazepines have worked.  If otherwise she has significant improvement but still significantly anxious we will consider resuming one of her diazepam 's during admission.  Diet: Normal VTE: Enoxaparin  Code: Full  Dispo: Admit patient to Inpatient with expected length of stay greater than 2 midnights.  Signed: Fairy Pool, DO Internal Medicine Resident, PGY-3 Please contact the on call pager at [phone] for any urgent or emergent needs. 10:27 AM 07/11/2024 "

## 2024-07-11 NOTE — ED Provider Notes (Signed)
 " [place] EMERGENCY DEPARTMENT AT [facility] Provider Note   CSN: [id number] Arrival date & time: 07/11/24  9546     Patient presents with: No chief complaint on file.   Jenny Yates is a 64 y.o. female.   64 y/o female with hx of HLD, HTN, hypothyroidism, collagenous colitis, spinal stenosis (s/p lumbar fusion in November 2025 by Dr. Mavis), and depression/anxiety presents to the emergency department with her husband.  Much of the history is provided by spouse.  States that patient has had a decline in her oral intake over the past several weeks.  She has had very little to eat or drink, though she continues to void approximately 2-3 times per day.  She does have issues with constipation at baseline.  The husband has been noticing increased episodes of confusion, more consistent with hallucinations.  Patient will remark about hearing someone in the next room or seeing mice crawling on the walls.  She has been unable to perform ADLs independently, which was her baseline prior to her back surgery.  Has necessitated the use of a walker when attempting to mobilize.  Had a fall yesterday where she struck her knee, but husband denies head trauma; she had no LOC.  No fevers, chest pain, SOB, abdominal pain.  Husband has noted a dry cough recently which is persistent.  Of note, patient admitted in November 2025 shortly after her back surgery following a fall. Hyponatremic on admission with nitrite positive urine; treated prophylactically. Metabolic encephalopathy felt to be related to polypharmacy, dehydration, and mild AKI. Transitioned home from rehab on 05/21/24.  The history is provided by the patient. No language interpreter was used.       Prior to Admission medications  Medication Sig Start Date End Date Taking? Authorizing Provider  acetaminophen  (TYLENOL ) 500 MG tablet Take 500 mg by mouth 2 (two) times daily as needed for mild pain (pain score 1-3) or moderate pain (pain  score 4-6).   Yes [provider]  ALPRAZolam  (XANAX ) 0.5 MG tablet Take 1 tablet (0.5 mg total) by mouth at bedtime as needed for anxiety. 05/27/24  Yes Fleeta Valeria Mayo, MD  celecoxib  (CELEBREX ) 200 MG capsule Take 1 capsule (200 mg total) by mouth daily. 05/27/24  Yes Fleeta Valeria Mayo, MD  Cholecalciferol (VITAMIN D3 PO) Take 2,000 Units by mouth at bedtime.   Yes [provider]  cyclobenzaprine  (FLEXERIL ) 5 MG tablet Take 1 tablet (5 mg total) by mouth 3 (three) times daily as needed for muscle spasms. Patient taking differently: Take 5 mg by mouth daily as needed for muscle spasms. 05/27/24  Yes Fleeta Valeria Mayo, MD  diazepam  (VALIUM ) 5 MG tablet Take 1 tablet (5 mg total) by mouth every 12 (twelve) hours as needed for anxiety. Patient taking differently: Take 5 mg by mouth daily as needed for anxiety. 06/03/24  Yes Fleeta Valeria Mayo, MD  fluticasone  (FLONASE ) 50 MCG/ACT nasal spray Place 1 spray into both nostrils daily as needed for allergies or rhinitis.   Yes [provider]  hydrochlorothiazide  (HYDRODIURIL ) 25 MG tablet Take 25 mg by mouth daily.   Yes [provider]  HYDROcodone -acetaminophen  (NORCO/VICODIN) 5-325 MG tablet Take 1 tablet by mouth daily as needed for moderate pain (pain score 4-6). 06/03/24  Yes Fleeta Valeria Mayo, MD  levothyroxine  (SYNTHROID ) 100 MCG tablet Take 1 tablet by mouth once daily 06/02/24  Yes Fleeta Valeria Mayo, MD  Magnesium  Oxide 250 MG TABS Take 250 mg by mouth  at bedtime.   Yes [provider]  Multiple Vitamin (MULTIVITAMIN WITH MINERALS) TABS tablet Take 1 tablet by mouth in the morning.   Yes [provider]  rosuvastatin  (CRESTOR ) 40 MG tablet Take 1 tablet (40 mg total) by mouth daily. Patient taking differently: Take 40 mg by mouth at bedtime. 05/27/24  Yes Fleeta Valeria Mayo, MD  budesonide  (ENTOCORT EC ) 3 MG 24 hr capsule Take 2 capsules (6 mg total) by mouth daily. Patient not taking: Reported on 07/11/2024 05/27/24    Fleeta Valeria Mayo, MD  nebivolol  (BYSTOLIC ) 10 MG tablet Take 20 mg by mouth daily. Patient not taking: Reported on 07/11/2024 06/14/24   [provider]  olmesartan  (BENICAR ) 20 MG tablet Take 1 tablet (20 mg total) by mouth in the morning and at bedtime. Patient not taking: Reported on 07/11/2024 08/24/23   Fleeta Valeria Mayo, MD    Allergies: Codeine, Epinephrine , Azithromycin, and Iodine    Review of Systems Ten systems reviewed and are negative for acute change, except as noted in the HPI.    Updated Vital Signs BP 127/82   Pulse (!) 106   Temp 98.3 F (36.8 C) (Oral)   Resp (!) 21   Wt 92.4 kg   SpO2 96%   BMI 41.12 kg/m   Physical Exam Vitals and nursing note reviewed.  Constitutional:      General: She is not in acute distress.    Appearance: She is well-developed. She is not diaphoretic.     Comments: Obese, nontoxic appearing female.  HENT:     Head: Normocephalic and atraumatic.     Mouth/Throat:     Mouth: Mucous membranes are dry.  Eyes:     General: No scleral icterus.    Conjunctiva/sclera: Conjunctivae normal.  Cardiovascular:     Rate and Rhythm: Regular rhythm. Tachycardia present.     Pulses: Normal pulses.  Pulmonary:     Effort: Pulmonary effort is normal. No respiratory distress.     Breath sounds: No stridor. No wheezing.     Comments: Respirations even and unlabored Musculoskeletal:        General: Normal range of motion.     Cervical back: Normal range of motion.     Comments: BLE pitting edema  Skin:    General: Skin is warm and dry.     Coloration: Skin is not pale.     Findings: No rash.     Comments: Erythema and scaling of dorsal skin, distal RLE; husband reports this to be baseline.  Neurological:     Mental Status: She is alert.     Comments: Alert, moving extremities spontaneously.  Psychiatric:        Behavior: Behavior normal.     (all labs ordered are listed, but only abnormal results are displayed) Labs Reviewed   COMPREHENSIVE METABOLIC PANEL WITH GFR - Abnormal; Notable for the following components:      Result Value   Sodium 134 (*)    Potassium 2.5 (*)    Chloride 90 (*)    CO2 34 (*)    Glucose, Bld 111 (*)    Creatinine, Ser 1.39 (*)    Calcium  >15.0 (*)    Total Protein 6.4 (*)    Albumin  3.3 (*)    AST 56 (*)    GFR, Estimated 42 (*)    All other components within normal limits  CBG MONITORING, ED - Abnormal; Notable for the following components:   Glucose-Capillary 117 (*)  All other components within normal limits  RESP PANEL BY RT-PCR (RSV, FLU A&B, COVID)  RVPGX2  LIPASE, BLOOD  CBC  MAGNESIUM   TSH  URINALYSIS, ROUTINE W REFLEX MICROSCOPIC  CALCIUM , IONIZED  PTH, INTACT AND CALCIUM   VITAMIN D  25 HYDROXY (VIT D DEFICIENCY, FRACTURES)    EKG: EKG Interpretation Date/Time:  Friday July 11 2024 06:51:17 EST Ventricular Rate:  104 PR Interval:  51 QRS Duration:  108 QT Interval:  350 QTC Calculation: 461 R Axis:   61  Text Interpretation: Sinus tachycardia Left atrial enlargement Low voltage, precordial leads Consider anterior infarct Confirmed by Mannie Pac [phone]) on 07/11/2024 9:39:33 AM  Radiology: DG Chest Portable 1 View Result Date: 07/11/2024 CLINICAL DATA:  Cough EXAM: PORTABLE CHEST 1 VIEW COMPARISON:  May 14, 2024 FINDINGS: Stable cardiomediastinal silhouette. Both lungs are clear. The visualized skeletal structures are unremarkable. IMPRESSION: No active disease. Electronically Signed   By: Lynwood Landy Raddle M.D.   On: 07/11/2024 07:47     .Critical Care  Performed by: Keith Sor, PA-C Authorized by: Keith Sor, PA-C   Critical care provider statement:    Critical care time (minutes):  30   Critical care time was exclusive of:  Separately billable procedures and treating other patients   Critical care was necessary to treat or prevent imminent or life-threatening deterioration of the following conditions:  Metabolic crisis   Critical  care was time spent personally by me on the following activities:  Development of treatment plan with patient or surrogate, discussions with consultants, evaluation of patient's response to treatment, examination of patient, ordering and review of laboratory studies, ordering and review of radiographic studies, ordering and performing treatments and interventions, pulse oximetry, re-evaluation of patient's condition, review of old charts and obtaining history from patient or surrogate   Care discussed with: admitting provider      Medications Ordered in the ED  Zoledronic  Acid (ZOMETA ) IVPB 4 mg (has no administration in time range)  enoxaparin  (LOVENOX ) injection 40 mg (has no administration in time range)  acetaminophen  (TYLENOL ) tablet 500 mg (500 mg Oral Given 07/11/24 0707)  sodium chloride  0.9 % bolus 1,000 mL (0 mLs Intravenous Stopped 07/11/24 0917)  potassium chloride  10 mEq in 100 mL IVPB (10 mEq Intravenous New Bag/Given 07/11/24 0829)  sodium chloride  0.9 % bolus 1,000 mL (1,000 mLs Intravenous New Bag/Given 07/11/24 0917)    Clinical Course as of 07/11/24 0937  Fri Jul 11, 2024  0818 Case discussed with internal medicine teaching service who will assess patient for admission. [KH]    Clinical Course User Index [KH] Keith Sor, PA-C                                 Medical Decision Making Amount and/or Complexity of Data Reviewed Labs: ordered. Radiology: ordered.  Risk OTC drugs. Prescription drug management. Decision regarding hospitalization.   This patient presents to the ED for concern of altered mental status, this involves an extensive number of treatment options, and is a complaint that carries with it a high risk of complications and morbidity.  The differential diagnosis includes UTI vs bacteremia vs polypharmacy vs electrolyte derangement vs myxedema coma vs ICH   Co morbidities that complicate the patient evaluation  HTN HLD Hypothyroid  Collagenous  colitis   Additional history obtained:  Additional history obtained from spouse, at bedside External records from outside source obtained and reviewed including MRI brain from  November 2025 which was negative for acute pathology   Lab Tests:  I Ordered, and personally interpreted labs.  The pertinent results include:  Calcium  >15, NA 134, K 2.5, Cl 90, Creatinine 1.39 (baseline ~0.75), AST 56   Imaging Studies ordered:  I ordered imaging studies including CXR  I independently visualized and interpreted imaging which showed no acute cardiopulmonary abnormality I agree with the radiologist interpretation   Cardiac Monitoring:  The patient was maintained on a cardiac monitor.  I personally viewed and interpreted the cardiac monitored which showed an underlying rhythm of: sinus tachycardia   Medicines ordered and prescription drug management:  I ordered medication including IV potassium for hypokalemia  Reevaluation of the patient after these medicines showed that the patient stayed the same I have reviewed the patients home medicines and have made adjustments as needed   Test Considered:  PTH   Critical Interventions:  IVF IV potassium   Consultations Obtained:  I requested consultation with Dr. Jolaine of IM teaching service and discussed lab and imaging findings as well as pertinent plan - they will assess patient in the ED for admission.   Problem List / ED Course:  As above   Reevaluation:  After the interventions noted above, I reevaluated the patient and found that they have :remained stable   Social Determinants of Health:  Ambulatory with assist device   Dispostion:  After consideration of the diagnostic results and the patients response to treatment, patient is critically ill and I feel that the patent would benefit from admission for management of acute metabolic encephalopathy, hypercalcemia, AKI. Favored secondary to dehydration, poor PO  intake. Also receiving IV potassium for hypokalemia. IM teaching service to admit.      Final diagnoses:  Acute metabolic encephalopathy  Hypercalcemia  Hypokalemia  AKI (acute kidney injury)    ED Discharge Orders     None          Keith Sor, PA-C 07/11/24 9060    Mannie Pac T, DO 07/11/24 1114  "

## 2024-07-11 NOTE — Hospital Course (Addendum)
 #Non-PTH mediated Hypercalcemia #Tremors #Decreased concentration  Hypercalcemia >15 on admission. Signs/symptoms included anorexia, decreased bowel output, generalized weakness, confusion, fatigue, tremors, decreased concentration, and AKI. Possible cause was immobilization in combination with thiazide diuretic versus parathyroid abnormality (primary vs. tertiary), malignancy, and possibly adrenal insufficiency.  Lower suspicion for adrenal insufficiency with normal blood pressure but she was on Budesonide  6 mg daily prior to her back surgery and discontinued without resuming. Husband provided history that this was a medication she took for 20 years, and had intermittently discontinued it or adjusted the dosing of it based on her collagenous colitis symptoms. AM cortisol of 5.8. BP remained reassuringly normal to elevated during hospitalization and Na wnl, so held off on ACTH  stimulation testing. Vitamin D  level normal, TSH normal, no prior history of hypercalcemia, no lithium use, no identifiable increased exogenous calcium  intake. Of note, by chart review from PCP notes, she was supposed to discontinue hydrochlorothiazide  and utilize Olmesartan  for BP control, but she was taking hydrochlorothiazide  and holding her Olmesartan  for the past few weeks. No prior PTH level.  Up-to-date on cancer screenings with colonoscopy in 2020 and planned repeat in 2027 after 3 polyps with unknown (to me) pathology were found, normal mammogram 05/2024, and prior abnormal Pap in 1983 s/p cryo procedure and normal Paps following. However, CTAP from 05/08/2024 did show 13 x 6 mm ground-glass nodule in the right lung apex with recommendation for repeat scan in 3-6 months to track. Given this new nodule, acute onset hypercalcemia and severity of hypercalcemia, felt greater suspicion for malignancy. Would not suspect hydrochlorothiazide  alone would cause this level of hypercalcemia, but possible. PTH low and ordered PTHrP which is  also still pending. Repeat 1/18 CTAP imaging did not show evidence of acute process or malignancy. Kappa/lambda light chains and protein electrophoresis labs still pending. She was given IVF NS, Zoledronic  acid, calcitonin, and potassium repletion which improved calcium  and potassium levels during admission. By day of discharge her calcium  improved to 9.1. She improved in terms of mentation, tremors, and overall pain. On 1/17, she did spike a 102.63F fever, without leukocytosis. Infectious workup was unrevealing and felt likely due to her hypercalcemia. Final blood cultures still pending, but are negative at 2 days. By day of discharge, she was overall improved. Etiology of her electrolyte derangement unclear at this time. Likely in part due to thiazide use, though unlikely this is the sole cause and would recommend continued outpatient work up. On day of discharge she did spike one more fever to 101, though mentation continued to be improved and patient not with any new symptoms. Given infectious work up unrevealing thus far, likely reactive in the setting of hypercalcemia. Repeat RSV/COVID-19/influenza swab was repeated and found to be negative. Fever resolved without Tylenol . Husband and patient felt comfortable with discharge to home and monitoring her at home for any new fevers.  - Continue outpatient workup for etiology of hypercalcemia - Discontinue hydrochlorothiazide   - f/u final blood culture results (NG at 2 days) - f/u Kappa/lamda light chains lab - f/u Protein electrophoresis lab - f/u PTHrP  - repeat BMP in 5-7 days    #Hypokalemia Likely caused by poor oral intake and thiazide diuretic use.  Potassium 2.5 on arrival. Magnesium  2.0. Given aggressive IV and oral potassium repletion and frequent metabolic panels to monitor her calcium  and potassium. On day of discharge potassium noted to be 3.0 on AM labs, given 80 mEq of KCl.  - repeat BMP in 5-7 days    #  AKI, improving  #Metabolic  alkalosis, resolved  Creatinine increased from baseline of ~ 0.7 - 0.8 to 1.39 on admission in the setting of decreased oral intake, severe hypercalcemia, and continue thiazide diuretic use.  She also had a mild metabolic alkalosis with a bicarb of 35.  With her altered mentation and severe electrolyte abnormalities obtained a blood gas but this to resolve with treatments as above. Initial UA hazy with large leukocytes, few bacteria, and small Hgb, though did not reflex to culture. Patient did spike a fever on 1/17 and obtained repeat UA which was not concerning for UTI. Treated prophylactically with Ceftriaxone  for 3 days just in case. By day of discharge, Cr continued to improve to 1.08. She was managed with IVF, and avoided nephrotoxic drugs as able. Would recommend repeat BMP at follow up to ensure resolution.  - encourage po intake - repeat BMP in 5-7 days   #Hypertension Mildly hypertensive to normotensive here. By chart review from PCP notes, she was supposed to discontinue hydrochlorothiazide  and utilize Olmesartan  for BP control, but she was taking hydrochlorothiazide  and holding her Olmesartan  for the past few weeks. Discontinued hydrochlorothiazide . During admission her BP was monitored and she was normotensive by day of discharge without having resumed Olmesartan  in setting of AKI.  - repeat BMP in 5-7 days, at that point if AKI resolved and clinically appropriate, then would recommend resuming Olmesartan    #Hypothyroidism Through chart review unknown cause of her hypothyroidism but TSH here 1.94. Continued levothyroxine  100 mcg daily during admission. - Continue levothyroxine  100 mcg daily     #Anxiety/depression with panic disorder Due to the possible neuropsychiatric effects of her severe hypercalcemia and since she has not been taking her benzodiazepines at home we will not resume this at this point but from her history provided by her husband she has not had any success from SSRIs but  Wellbutrin and benzodiazepines have worked. Resumed her home Xanax .  #s/p Lumbar L4-L5 fusion on 04/23/2024  Underwent L4-L5 lumbar disc fusion on 04/23/2024 with Dr. Mavis for herniated disc. Surgery recovery was complicated by by a mechanical fall she sustained on 05/08/2024. She was hospitalized from 11/13 - 05/14/2024 for AMS and hypotension on admission. At that admission, CT of the chest and abdomen showed a 13 x 6 mm ground glass nodule in the right lung apex recommending CT chest 6 to 12 months. CT lumbar spine showed possible nondisplaced fracture of the left L4 transverse process with hardware intact. Neurosurgery follow-up advised conservative care and patient was maintained in a lumbar corset applied in sitting position. Patient's AMS felt to be related to polypharmacy, dehydration, and mild AKI.

## 2024-07-12 DIAGNOSIS — N179 Acute kidney failure, unspecified: Secondary | ICD-10-CM

## 2024-07-12 DIAGNOSIS — F410 Panic disorder [episodic paroxysmal anxiety] without agoraphobia: Secondary | ICD-10-CM | POA: Diagnosis not present

## 2024-07-12 DIAGNOSIS — R509 Fever, unspecified: Secondary | ICD-10-CM

## 2024-07-12 DIAGNOSIS — I10 Essential (primary) hypertension: Secondary | ICD-10-CM

## 2024-07-12 DIAGNOSIS — E039 Hypothyroidism, unspecified: Secondary | ICD-10-CM | POA: Diagnosis not present

## 2024-07-12 DIAGNOSIS — E242 Drug-induced Cushing's syndrome: Secondary | ICD-10-CM | POA: Diagnosis present

## 2024-07-12 LAB — COMPREHENSIVE METABOLIC PANEL WITH GFR
ALT: 19 U/L (ref 0–44)
AST: 49 U/L — ABNORMAL HIGH (ref 15–41)
Albumin: 3 g/dL — ABNORMAL LOW (ref 3.5–5.0)
Alkaline Phosphatase: 114 U/L (ref 38–126)
Anion gap: 7 (ref 5–15)
BUN: 13 mg/dL (ref 8–23)
CO2: 30 mmol/L (ref 22–32)
Calcium: 12.4 mg/dL — ABNORMAL HIGH (ref 8.9–10.3)
Chloride: 101 mmol/L (ref 98–111)
Creatinine, Ser: 1.26 mg/dL — ABNORMAL HIGH (ref 0.44–1.00)
GFR, Estimated: 48 mL/min — ABNORMAL LOW
Glucose, Bld: 117 mg/dL — ABNORMAL HIGH (ref 70–99)
Potassium: 3.5 mmol/L (ref 3.5–5.1)
Sodium: 138 mmol/L (ref 135–145)
Total Bilirubin: 0.4 mg/dL (ref 0.0–1.2)
Total Protein: 5.6 g/dL — ABNORMAL LOW (ref 6.5–8.1)

## 2024-07-12 LAB — URINALYSIS, W/ REFLEX TO CULTURE (INFECTION SUSPECTED)
Bilirubin Urine: NEGATIVE
Glucose, UA: NEGATIVE mg/dL
Ketones, ur: NEGATIVE mg/dL
Leukocytes,Ua: NEGATIVE
Nitrite: NEGATIVE
Protein, ur: 30 mg/dL — AB
Specific Gravity, Urine: 1.008 (ref 1.005–1.030)
pH: 9 — ABNORMAL HIGH (ref 5.0–8.0)

## 2024-07-12 LAB — CBC WITH DIFFERENTIAL/PLATELET
Abs Immature Granulocytes: 0.02 K/uL (ref 0.00–0.07)
Basophils Absolute: 0.1 K/uL (ref 0.0–0.1)
Basophils Relative: 1 %
Eosinophils Absolute: 0.3 K/uL (ref 0.0–0.5)
Eosinophils Relative: 4 %
HCT: 33.7 % — ABNORMAL LOW (ref 36.0–46.0)
Hemoglobin: 11 g/dL — ABNORMAL LOW (ref 12.0–15.0)
Immature Granulocytes: 0 %
Lymphocytes Relative: 16 %
Lymphs Abs: 1 K/uL (ref 0.7–4.0)
MCH: 29.7 pg (ref 26.0–34.0)
MCHC: 32.6 g/dL (ref 30.0–36.0)
MCV: 91.1 fL (ref 80.0–100.0)
Monocytes Absolute: 0.6 K/uL (ref 0.1–1.0)
Monocytes Relative: 10 %
Neutro Abs: 4.4 K/uL (ref 1.7–7.7)
Neutrophils Relative %: 69 %
Platelets: 255 K/uL (ref 150–400)
RBC: 3.7 MIL/uL — ABNORMAL LOW (ref 3.87–5.11)
RDW: 15.4 % (ref 11.5–15.5)
WBC: 6.4 K/uL (ref 4.0–10.5)
nRBC: 0 % (ref 0.0–0.2)

## 2024-07-12 LAB — PHOSPHORUS: Phosphorus: 1.5 mg/dL — ABNORMAL LOW (ref 2.5–4.6)

## 2024-07-12 LAB — CORTISOL-AM, BLOOD: Cortisol - AM: 5.8 ug/dL — ABNORMAL LOW (ref 6.7–22.6)

## 2024-07-12 MED ORDER — SODIUM CHLORIDE 0.9 % IV SOLN
2.0000 g | INTRAVENOUS | Status: DC
  Administered 2024-07-12 – 2024-07-14 (×3): 2 g via INTRAVENOUS
  Filled 2024-07-12 (×3): qty 20

## 2024-07-12 MED ORDER — DICLOFENAC SODIUM 1 % EX GEL: 4.0000 g | Freq: Four times a day (QID) | CUTANEOUS | Status: DC | PRN

## 2024-07-12 MED ORDER — ACETAMINOPHEN 500 MG PO TABS
1000.0000 mg | ORAL_TABLET | Freq: Four times a day (QID) | ORAL | Status: DC | PRN
  Administered 2024-07-12: 1000 mg via ORAL
  Filled 2024-07-12: qty 2

## 2024-07-12 MED ORDER — SODIUM CHLORIDE 0.9 % IV SOLN: INTRAVENOUS | Status: DC

## 2024-07-12 MED ORDER — LIDOCAINE 5 % EX PTCH
1.0000 | MEDICATED_PATCH | CUTANEOUS | Status: DC
  Administered 2024-07-12: 1 via TRANSDERMAL
  Filled 2024-07-12 (×2): qty 1

## 2024-07-12 MED ORDER — SODIUM CHLORIDE 0.9 % IV BOLUS
1000.0000 mL | Freq: Once | INTRAVENOUS | Status: AC
  Administered 2024-07-12: 1000 mL via INTRAVENOUS

## 2024-07-12 MED ORDER — IRBESARTAN 150 MG PO TABS: 75.0000 mg | ORAL_TABLET | Freq: Every day | ORAL | Status: DC

## 2024-07-12 NOTE — Evaluation (Signed)
 Occupational Therapy Evaluation Patient Details Name: Jenny Yates MRN: [id number] DOB: [birth year] Today's Date: 07/12/2024   History of Present Illness   Jenny Yates is a 64 y.o. female adm 07/11/24 for hypercalcemia. Pt presented with AMS and a few weeks of severely decreased oral intake. Found to have severe electrolyte disturbances.   CBC within normal limits, negative for flu/COVID/RSV, and chest x-ray without significant abnormality. PMHx: HTN, hypothyroidism, collagenous colitis, Iatrogenic Cushing's syndrome due to chronic steroid for colitis, HLD, normocytic anemia, anxiety/depression with panic disorder, and L4-5 PLIF (04/23/24).     Clinical Impressions Pt/spouse report a functional decline in the past 2-3 weeks where pt has required increased physical assist with ADLs and mobility. Previously, pt was completing ADLs largely with Mod I, was receiving assistance for IADLs, and was performing functional mobility short distances with a youth RW with Mod I to Supervision. Pt now presents with decreased activity tolerance, decreased cognition, generalized B UE weakness, decreased B UE coordination, decreased balance, and decreased safety and independence with functional tasks. Pt VSS on RA. Pt participated well and has a supportive husband who is able to and comfortable with providing care for pt in the home. Anticipate pt to progress well toward OT goals once electrolytes are stabilized. Pt will benefit from acute skilled OT services to address deficits and increase safety and independence with functional tasks. Post acute discharge, pt will benefit from [facility] OT to maximize rehab potential paired with continued assistance of husband for safety.      If plan is discharge home, recommend the following:   Two people to help with walking and/or transfers;Two people to help with bathing/dressing/bathroom;Assistance with cooking/housework;Direct supervision/assist for medications  management;Assistance with feeding;Direct supervision/assist for financial management;Assist for transportation;Help with stairs or ramp for entrance;Supervision due to cognitive status     Functional Status Assessment   Patient has had a recent decline in their functional status and demonstrates the ability to make significant improvements in function in a reasonable and predictable amount of time.     Equipment Recommendations   BSC/3in1     Recommendations for Other Services         Precautions/Restrictions   Precautions Precautions: Fall Recall of Precautions/Restrictions: Impaired Required Braces or Orthoses: Spinal Brace Spinal Brace: Lumbar corset;Applied in sitting position;Other (comment) (for comfort) Restrictions Weight Bearing Restrictions Per Provider Order: No     Mobility Bed Mobility Overal bed mobility: Needs Assistance Bed Mobility: Rolling, Sidelying to Sit, Sit to Supine Rolling: Mod assist, +2 for physical assistance, Used rails Sidelying to sit: Mod assist, +2 for physical assistance, Used rails   Sit to supine: Mod assist, +2 for physical assistance   General bed mobility comments: Pt sat up on R side of bed with increased time. Cues for log roll technique. Instructed pt to bring LLE into knee flex and pt reached for PT with LUE. Guided pt to bed rail. Use of bed pad to facilitate sidelying. Assist to bring BLE off EOB and raise trunk. Returning to bed pt got in on L side. OT managed trunk and PT brought BLE back in. Repositioned using bed pad and +2 assist.    Transfers Overall transfer level: Needs assistance Equipment used: Rolling walker (2 wheels) Transfers: Sit to/from Stand, Bed to chair/wheelchair/BSC Sit to Stand: Min assist, +2 physical assistance     Step pivot transfers: Min assist, +2 safety/equipment     General transfer comment: Pt stood from lowest bed height. Cued proper hand placement using  RW. Powered up with minA x2.  Spouse reports she doesn't tolerate static stance for long d/t pain. Fair eccentric control with sitting. Cued pt to reach back for bed rail. Assist needed to turn RW.      Balance Overall balance assessment: Needs assistance Sitting-balance support: No upper extremity supported, Feet supported Sitting balance-Leahy Scale: Fair Sitting balance - Comments: Sat EOB with CGA. Assist to don/doff lumbar corset brace.   Standing balance support: Bilateral upper extremity supported, During functional activity, Reliant on assistive device for balance Standing balance-Leahy Scale: Poor Standing balance comment: Pt dependent on RW                           ADL either performed or assessed with clinical judgement   ADL Overall ADL's : Needs assistance/impaired Eating/Feeding: Maximal assistance;Cueing for sequencing;Sitting   Grooming: Moderate assistance;Maximal assistance;Sitting;Cueing for sequencing   Upper Body Bathing: Maximal assistance;Cueing for sequencing;Sitting   Lower Body Bathing: Total assistance;Sit to/from stand;+2 for physical assistance   Upper Body Dressing : Maximal assistance;Sitting;Cueing for sequencing   Lower Body Dressing: Total assistance;+2 for physical assistance;Sit to/from stand;Cueing for sequencing;Cueing for safety   Toilet Transfer: Minimal assistance;+2 for physical assistance;+2 for safety/equipment;Cueing for sequencing;Cueing for safety;Ambulation;BSC/3in1;Rolling walker (2 wheels) Toilet Transfer Details (indicate cue type and reason): simulated at EOB Toileting- Clothing Manipulation and Hygiene: Total assistance;+2 for physical assistance;Sit to/from stand       Functional mobility during ADLs: Minimal assistance;+2 for physical assistance;Rolling walker (2 wheels);Cueing for sequencing;Cueing for safety       Vision Baseline Vision/History: 1 Wears glasses Ability to See in Adequate Light: 0 Adequate (with glasses) Patient Visual  Report: No change from baseline Additional Comments: Vision [facility] for tasks assessed; not formally screened or evaluated     Perception         Praxis         Pertinent Vitals/Pain Pain Assessment Pain Assessment: Faces Faces Pain Scale: Hurts whole lot Pain Location: back Pain Descriptors / Indicators: Discomfort, Grimacing, Moaning Pain Intervention(s): Limited activity within patient's tolerance, Monitored during session, Repositioned     Extremity/Trunk Assessment Upper Extremity Assessment Upper Extremity Assessment: Defer to OT evaluation RUE Deficits / Details: generalized weakness; decreased coordination; decreased proprioception; mildly edematous throughout; erythema throughout RUE Sensation: decreased proprioception RUE Coordination: decreased fine motor;decreased gross motor LUE Deficits / Details: generalized weakness; decreased coordination; decreased proprioception; mildly edematous throughout; erythema throughout LUE Sensation: decreased proprioception LUE Coordination: decreased gross motor;decreased fine motor   Lower Extremity Assessment Lower Extremity Assessment: RLE deficits/detail;LLE deficits/detail RLE Deficits / Details: Decreased AROM. Decreased strength, grossly 3-/5. Erythema and edema. RLE Sensation: WNL LLE Deficits / Details: Decreased AROM. Decreased strength, grossly 3-/5. Erythema and edema. LLE Sensation: WNL   Cervical / Trunk Assessment Cervical / Trunk Assessment: Other exceptions Cervical / Trunk Exceptions: Increased Body Habitus   Communication Communication Communication: Impaired Factors Affecting Communication: Difficulty expressing self;Reduced clarity of speech (Difficulty with word finding; incoherent at times)   Cognition Arousal: Alert Behavior During Therapy: Flat affect, Anxious Cognition: Cognition impaired   Orientation impairments: Place, Time, Situation (oriented to self and husband) Awareness: Intellectual  awareness intact, Online awareness impaired Memory impairment (select all impairments): Working civil service fast streamer, Programmer, systems, Engineer, structural memory Attention impairment (select first level of impairment): Selective attention Executive functioning impairment (select all impairments): Organization, Sequencing, Reasoning, Problem solving OT - Cognition Comments: Pt reports she is confused and having trouble putting her thoughts together. Spouse  reports a cognitive decline that has been progressive since 2 weeks after her back surery and occurs more often with certain medications. She has a recent hx of AMS associated with electrolyte imbalance.                 Following commands: Impaired Following commands impaired: Follows one step commands inconsistently, Follows one step commands with increased time     Cueing  General Comments   Cueing Techniques: Verbal cues;Gestural cues  Husband present and supportive throughout. VSS on RA   Exercises     Shoulder Instructions      Home Living Family/patient expects to be discharged to:: Private residence Living Arrangements: Spouse/significant other Available Help at Discharge: Family;Available 24 hours/day Type of Home: House Home Access: Stairs to enter [employer] of Steps: 2 (2 + a large threshold step) Entrance Stairs-Rails: Right;Left;Can reach both Home Layout: Two level;Able to live on main level with bedroom/bathroom Alternate Level Stairs-Number of Steps: Rarely went upstairs   Bathroom Shower/Tub: Chief Strategy Officer: Standard Bathroom Accessibility: Yes How Accessible: Accessible via walker Home Equipment: Shower seat;Grab bars - tub/shower;Cane - single point;Other (comment);Rolling Walker (2 wheels) (bed rail added to regular bed)          Prior Functioning/Environment Prior Level of Function : Needs assist             Mobility Comments: Intermittent assistance for bed mobility  and assist with transfers over the past 2-3 weeks, prior to that Mod I to Supervision for transfers mobility with RW; about 2 wks after back sx in 2025, had a fall getting up from bed without a brace re-admitted and completed CIR ADLs Comments: needing Max assist for ADLs over the past 2-3 weeks; prior to that Mod I with all ADLs except for assist with shoes/socks    OT Problem List: Decreased strength;Decreased activity tolerance;Impaired balance (sitting and/or standing);Decreased coordination;Decreased cognition;Decreased safety awareness;Decreased knowledge of use of DME or AE;Decreased knowledge of precautions   OT Treatment/Interventions: Self-care/ADL training;Therapeutic exercise;Energy conservation;DME and/or AE instruction;Therapeutic activities;Cognitive remediation/compensation;Patient/family education;Balance training      OT Goals(Current goals can be found in the care plan section)   Acute Rehab OT Goals Patient Stated Goal: to return home OT Goal Formulation: With patient/family Time For Goal Achievement: 07/26/24 Potential to Achieve Goals: Good ADL Goals Pt Will Perform Eating: with contact guard assist;sitting Pt Will Perform Grooming: with contact guard assist;sitting Pt Will Perform Upper Body Bathing: with min assist;sitting Pt Will Perform Lower Body Dressing: with mod assist;sit to/from stand;sitting/lateral leans (with adaptive equipment as needed) Pt Will Transfer to Toilet: with contact guard assist;ambulating;regular height toilet (with least restrictive AD) Pt Will Perform Toileting - Clothing Manipulation and hygiene: with contact guard assist;sitting/lateral leans;sit to/from stand Additional ADL Goal #1: Patient will demonstrate ability to appropriately sequence familiar 3-step funcitonal or therapeutic tasks with Mod I in 3/4 opportunities to increase safety and independence with functional tasks.   OT Frequency:  Min 2X/week    Co-evaluation PT/OT/SLP  Co-Evaluation/Treatment: Yes Reason for Co-Treatment: For patient/therapist safety;To address functional/ADL transfers PT goals addressed during session: Mobility/safety with mobility;Balance;Proper use of DME OT goals addressed during session: ADL's and self-care      AM-PAC OT 6 Clicks Daily Activity     Outcome Measure Help from another person eating meals?: A Lot Help from another person taking care of personal grooming?: A Lot Help from another person toileting, which includes using toliet, bedpan, or urinal?: Total Help  from another person bathing (including washing, rinsing, drying)?: A Lot Help from another person to put on and taking off regular upper body clothing?: A Lot Help from another person to put on and taking off regular lower body clothing?: Total 6 Click Score: 10   End of Session    Activity Tolerance:   Patient left:    OT Visit Diagnosis: Unsteadiness on feet (R26.81);Other abnormalities of gait and mobility (R26.89);Muscle weakness (generalized) (M62.81);Other symptoms and signs involving cognitive function                Time: 1318-1400 OT Time Calculation (min): 42 min Charges:  OT General Charges $OT Visit: 1 Visit OT Evaluation $OT Eval Moderate Complexity: 1 Mod OT Treatments $Self Care/Home Management : 8-22 mins  Margarie Rockey HERO., OTR/L, MA Acute Rehab [phone]   Margarie FORBES Horns 07/12/2024, 6:00 PM

## 2024-07-12 NOTE — Evaluation (Signed)
 Physical Therapy Evaluation Patient Details Name: Jenny Yates MRN: [id number] DOB: 12/31/1960 Today's Date: 07/12/2024  History of Present Illness  Jenny Yates is a 64 y.o. female adm 07/11/24 for hypercalcemia. Pt presented with AMS and a few weeks of severely decreased oral intake. Found to have severe electrolyte disturbances. CBC within normal limits, negative for flu/COVID/RSV, and chest x-ray without significant abnormality. PMHx: HTN, hypothyroidism, collagenous colitis, Iatrogenic Cushing's syndrome due to chronic steroid for colitis, HLD, normocytic anemia, anxiety/depression with panic disorder, and L4-5 PLIF (04/23/24).   Clinical Impression  Pt admitted with above diagnosis. PTA, pt required assistance for functional mobility and ADLs. Pt/spouse report a functional decline in the past 2-3 weeks where she has required increased physical assist. Previously, pt was supervision-modI for mobility ambulating short distances using a youth RW. Pt lives with her husband in a two story house with 2-3 STE. She is able to reside on the main level. Her spouse provides 24/7 support. Pt currently with functional limitations due to the deficits listed below (see PT Problem List). She required modA x2 for bed mobility, minA x2 for sit<>stand using RW, and CGA-minA x2 for short distance in-room ambulation using RW. Pt is currently limited by decreased cognition, chronic back pain, BLE weakness, decreased balance, and impaired activity tolerance. Pt will benefit from acute skilled PT to increase her independence and safety with mobility to allow discharge. Anticipate pt to progress well once electrolytes are stabilized. Recommend HHPT to increase strength, improve balance, decrease fall risk, advance activity tolerance, and optimize safety within the home environment.      If plan is discharge home, recommend the following: A lot of help with walking and/or transfers;A lot of help with  bathing/dressing/bathroom;Assistance with cooking/housework;Assist for transportation;Help with stairs or ramp for entrance;Supervision due to cognitive status   Can travel by private vehicle        Equipment Recommendations Wheelchair (measurements PT);BSC/3in1  Recommendations for Other Services       Functional Status Assessment Patient has had a recent decline in their functional status and demonstrates the ability to make significant improvements in function in a reasonable and predictable amount of time.     Precautions / Restrictions Precautions Precautions: Fall Recall of Precautions/Restrictions: Impaired Required Braces or Orthoses: Spinal Brace Spinal Brace: Lumbar corset;Applied in sitting position;Other (comment) (for comfort) Restrictions Weight Bearing Restrictions Per Provider Order: No      Mobility  Bed Mobility Overal bed mobility: Needs Assistance Bed Mobility: Rolling, Sidelying to Sit, Sit to Supine Rolling: Mod assist, +2 for physical assistance, Used rails Sidelying to sit: Mod assist, +2 for physical assistance, Used rails   Sit to supine: Mod assist, +2 for physical assistance   General bed mobility comments: Pt sat up on R side of bed with increased time. Cues for log roll technique. Instructed pt to bring LLE into knee flex and pt reached for PT with LUE. Guided pt to bed rail. Use of bed pad to facilitate sidelying. Assist to bring BLE off EOB and raise trunk. Returning to bed pt got in on L side. OT managed trunk and PT brought BLE back in. Repositioned using bed pad and +2 assist.    Transfers Overall transfer level: Needs assistance Equipment used: Rolling walker (2 wheels) Transfers: Sit to/from Stand Sit to Stand: Min assist, +2 physical assistance           General transfer comment: Pt stood from lowest bed height. Cued proper hand placement using RW. Powered  up with minA x2. Spouse reports she doesn't tolerate static stance for long  d/t pain. Fair eccentric control with sitting. Cued pt to reach back for bed rail.    Ambulation/Gait Ambulation/Gait assistance: Contact guard assist, Min assist, +2 safety/equipment Gait Distance (Feet): 8 Feet Assistive device: Rolling walker (2 wheels) Gait Pattern/deviations: Step-to pattern, Decreased step length - right, Decreased step length - left, Decreased stride length, Wide base of support, Trunk flexed Gait velocity: decr     General Gait Details: Pt ambulated around the FOB with short slow steps. She demonstrated minimal foot clearence and fwd lean. Cues for sequencing, increase step length, upright posture, and closer proximity to AD. MinA to aid in navigating and manuevering RW within the tight space. Noted increased WOB, but SpO2 >92% on RA. Cued PLB technique. Gait distance limited d/t fatigue.  Stairs            Wheelchair Mobility     Tilt Bed    Modified Rankin (Stroke Patients Only)       Balance Overall balance assessment: Needs assistance Sitting-balance support: No upper extremity supported, Feet supported Sitting balance-Leahy Scale: Fair Sitting balance - Comments: Sat EOB with CGA. Assist to don/doff lumbar corset brace.   Standing balance support: Bilateral upper extremity supported, Reliant on assistive device for balance, During functional activity Standing balance-Leahy Scale: Poor Standing balance comment: Pt dependent on RW                             Pertinent Vitals/Pain Pain Assessment Pain Assessment: Faces Faces Pain Scale: Hurts whole lot Pain Location: Back Pain Descriptors / Indicators: Discomfort, Grimacing, Moaning Pain Intervention(s): Monitored during session, Limited activity within patient's tolerance, Repositioned    Home Living Family/patient expects to be discharged to:: Private residence Living Arrangements: Spouse/significant other Available Help at Discharge: Family;Available 24 hours/day Type of  Home: House Home Access: Stairs to enter Entrance Stairs-Rails: Right;Left;Can reach both Entrance Stairs-Number of Steps: 2 (2 + a large threshold step) Alternate Level Stairs-Number of Steps: Rarely went upstairs Home Layout: Two level;Able to live on main level with bedroom/bathroom Home Equipment: Shower seat;Grab bars - tub/shower;Cane - single point;Other (comment);Rolling Walker (2 wheels) (bed rail added to regular bed)      Prior Function Prior Level of Function : Needs assist             Mobility Comments: Intermittent assistance for bed mobility and assist with transfers over the past 2-3 weeks, prior to that Mod I to Supervision for transfers mobility with RW; about 2 wks after back sx in 2025, had a fall getting up from bed without a brace re-admitted and completed CIR ADLs Comments: needing Max assist for ADLs over the past 2-3 weeks; prior to that Mod I with all ADLs except for assist with shoes/socks     Extremity/Trunk Assessment   Upper Extremity Assessment Upper Extremity Assessment: Defer to OT evaluation    Lower Extremity Assessment Lower Extremity Assessment: RLE deficits/detail;LLE deficits/detail RLE Deficits / Details: Decreased AROM. Decreased strength, grossly 3-/5. Erythema and edema. RLE Sensation: WNL LLE Deficits / Details: Decreased AROM. Decreased strength, grossly 3-/5. Erythema and edema. LLE Sensation: WNL    Cervical / Trunk Assessment Cervical / Trunk Assessment: Other exceptions Cervical / Trunk Exceptions: Increased Body Habitus  Communication   Communication Communication: Impaired Factors Affecting Communication: Difficulty expressing self;Reduced clarity of speech (Difficulty with word finding; incoherent at times)  Cognition Arousal: Alert Behavior During Therapy: Flat affect, Anxious   PT - Cognitive impairments: Awareness, Memory, Attention, Initiation, Sequencing, Problem solving, Safety/Judgement                        PT - Cognition Comments: Pt reports she is confused and having trouble putting her thoughts together. Spouse reports a cognitive decline that has been progressive since 2 weeks after her back surery and occurs more often with certain medications. She has a recent hx of AMS associated with electrolyte imbalance. Following commands: Impaired Following commands impaired: Follows one step commands with increased time, Follows one step commands inconsistently     Cueing Cueing Techniques: Verbal cues, Gestural cues     General Comments General comments (skin integrity, edema, etc.): Husband present and supportive throughout. VSS on RA    Exercises     Assessment/Plan    PT Assessment Patient needs continued PT services  PT Problem List Decreased strength;Decreased activity tolerance;Decreased balance;Decreased mobility;Decreased cognition;Decreased knowledge of use of DME;Decreased safety awareness;Pain       PT Treatment Interventions DME instruction;Gait training;Stair training    PT Goals (Current goals can be found in the Care Plan section)  Acute Rehab PT Goals Patient Stated Goal: Return Home PT Goal Formulation: With patient/family Time For Goal Achievement: 07/26/24 Potential to Achieve Goals: Good    Frequency Min 2X/week     Co-evaluation   Reason for Co-Treatment: For patient/therapist safety;To address functional/ADL transfers PT goals addressed during session: Mobility/safety with mobility;Balance;Proper use of DME         AM-PAC PT 6 Clicks Mobility  Outcome Measure Help needed turning from your back to your side while in a flat bed without using bedrails?: Total Help needed moving from lying on your back to sitting on the side of a flat bed without using bedrails?: Total Help needed moving to and from a bed to a chair (including a wheelchair)?: A Lot Help needed standing up from a chair using your arms (e.g., wheelchair or bedside chair)?: A  Lot Help needed to walk in hospital room?: Total Help needed climbing 3-5 steps with a railing? : Total 6 Click Score: 8    End of Session Equipment Utilized During Treatment: Gait belt Activity Tolerance: Patient tolerated treatment well;Patient limited by pain;Patient limited by fatigue Patient left: in bed;with call bell/phone within reach;with bed alarm set;with family/visitor present Nurse Communication: Mobility status PT Visit Diagnosis: Muscle weakness (generalized) (M62.81);Difficulty in walking, not elsewhere classified (R26.2);Other abnormalities of gait and mobility (R26.89);Unsteadiness on feet (R26.81)    Time: 1318-1400 PT Time Calculation (min) (ACUTE ONLY): 42 min   Charges:   PT Evaluation $PT Eval Moderate Complexity: 1 Mod   PT General Charges $$ ACUTE PT VISIT: 1 Visit         Randall SAUNDERS, PT, DPT Acute Rehabilitation Services Office: [phone] Secure Chat Preferred  Delon CHRISTELLA Callander 07/12/2024, 5:15 PM

## 2024-07-12 NOTE — Progress Notes (Addendum)
 Pt BP 169/102; Per husband pt takes hydrochlorothiazide  25mg  daily and has been holding Benicar /olmesartan  20mg  since November.  HR 122; T 102.5. MD notified.

## 2024-07-12 NOTE — Progress Notes (Signed)
 Patient is have lower back pain 10/10 which is chronic for her. Tylenol  was given at 0817 for fever. Requesting pain medication. MD notified.

## 2024-07-12 NOTE — Progress Notes (Addendum)
 "  HD#1 SUBJECTIVE:  Patient Summary: Jenny Yates is a 64 year old female with past medical history of hypertension, hypothyroidism, collagenous colitis previously on Budesonide , hyperlipidemia, normocytic anemia, anxiety/depression with panic disorder, and herniated lumbar disc s/p L4/L5 fusion on 04/23/2024 who presented with several weeks of persistent altered mental status and a few weeks of severely decreased oral intake, found to have severe electrolyte disturbances and admitted for severe hypercalcemia and hypokalemia management, improving but with new fever (Tmax 102.53F) today.   Overnight Events:  None. Just before rounding team went to see patient, notified by RN that she was febrile to 102.5, BP 169/102, HR 122. BP on repeat 30 minutes later of 159/87.   Interim History:  This morning she hurts but is unable to fully clarify how. She admits to feeling confused and as if her thoughts are hard to put together. Her husband at bedside confirms that this state progressive since christmas when her mentation slowed, chronic diarrhea changed to constipation, a she is eating less. Her husband notes that she has progressively declined since traveling to [place] for a three week hiking trip. She's had a dry cough x 1 week and two mild periods of emesis over the past week. She denies SOB, abdominal pain. She ascribes to dysuria. Husband does endorse that ever since patient had COVID-19 two years prior, her O2 saturations are typically 88-92% and > 95% is unusual for her.   OBJECTIVE:  Vital Signs: Vitals:   07/12/24 0800 07/12/24 0832 07/12/24 1031 07/12/24 1207  BP: (!) 169/102 (!) 159/87  (!) 116/50  Pulse: (!) 123 (!) 122  (!) 113  Resp: 20   (!) 24  Temp: (!) 102.5 F (39.2 C)  98.7 F (37.1 C) 98.1 F (36.7 C)  TempSrc: Oral  Oral Oral  SpO2: 95%   96%  Weight:       Supplemental O2: Nasal Cannula SpO2: 96 % O2 Flow Rate (L/min): 4 L/min  Filed Weights   07/11/24 0930 07/12/24  0306  Weight: 92.4 kg 99.7 kg     Intake/Output Summary (Last 24 hours) at 07/12/2024 1400 Last data filed at 07/12/2024 9063 Gross per 24 hour  Intake 1033.11 ml  Output 1300 ml  Net -266.89 ml   Net IO Since Admission: -416.89 mL [07/12/24 1400]  Physical Exam:  Constitutional: obese-appearing female, diaphoretic and tremoring with difficulty voicing her thoughts;  lying in hospital bed, in no acute distress HEENT: normocephalic atraumatic, mucous membranes dry, face flushed, Moon faces  Cardiovascular: tachycardic 120s, regular rhythm, bilateral radial pulses 2+, bilateral dorsal pedal pulses 2+, brisk capillary refill bilateral feet and hands  Pulmonary/Chest: increased work of breathing on 4L [place], lungs clear to auscultation bilaterally Abdominal: soft, non-tender, non-distended MSK: normal bulk and tone. Negative Brudzinski's sign, neck without pain to flexion  Neurological: alert & oriented x 3 Skin: warm and dry Psych: mood calm, behavior normal, thought content normal, judgement normal    Patient Lines/Drains/Airways Status     Active Line/Drains/Airways     Name Placement date Placement time Site Days   Peripheral IV 07/11/24 20 G Left Antecubital 07/11/24  0615  Antecubital  1   External Urinary Catheter 07/11/24  1625  --  1   Wound 04/23/24 1654 Surgical Closed Surgical Incision Back 04/23/24  1654  Back  80   Wound 05/08/24 2145 Traumatic Knee Anterior;Left 05/08/24  2145  Knee  65   Wound 05/08/24 2145 Traumatic Elbow Posterior;Right 05/08/24  2145  Elbow  65   Wound 07/12/24 0836 Traumatic Knee Anterior;Right 07/12/24  0836  Knee  less than 1            Pertinent labs and imaging:      Latest Ref Rng & Units 07/12/2024    7:08 AM 07/11/2024   11:34 AM 07/11/2024    5:23 AM  CBC  WBC 4.0 - 10.5 K/uL 6.4   7.6   Hemoglobin 12.0 - 15.0 g/dL 88.9  9.9  87.7   Hematocrit 36.0 - 46.0 % 33.7  29.0  37.9   Platelets 150 - 400 K/uL 255   309        Latest  Ref Rng & Units 07/12/2024    7:08 AM 07/11/2024    4:02 PM 07/11/2024   11:34 AM  CMP  Glucose 70 - 99 mg/dL 882  96    BUN 8 - 23 mg/dL 13  15    Creatinine 9.55 - 1.00 mg/dL 8.73  8.64    Sodium 864 - 145 mmol/L 138  136  135   Potassium 3.5 - 5.1 mmol/L 3.5  2.9  2.3   Chloride 98 - 111 mmol/L 101  97    CO2 22 - 32 mmol/L 30  30    Calcium  8.9 - 10.3 mg/dL 87.5  85.9    Total Protein 6.5 - 8.1 g/dL 5.6  5.3    Total Bilirubin 0.0 - 1.2 mg/dL 0.4  0.4    Alkaline Phos 38 - 126 U/L 114  107    AST 15 - 41 U/L 49  44    ALT 0 - 44 U/L 19  17      No results found.  ASSESSMENT/PLAN:  Assessment: Principal Problem:   Hypercalcemia Active Problems:   Essential hypertension   Hypothyroidism   AKI (acute kidney injury)   Hyponatremia   Metabolic alkalosis   Acute metabolic encephalopathy   Hypokalemia   Mild protein-calorie malnutrition   Iatrogenic Cushing's syndrome due to chronic steroid for colitis  [name] is a 64 year old female with past medical history of hypertension, hypothyroidism, collagenous colitis previously on Budesonide , hyperlipidemia, normocytic anemia, anxiety/depression with panic disorder, and herniated lumbar disc s/p L4/L5 fusion on 04/23/2024 who presented with several weeks of persistent altered mental status and a few weeks of severely decreased oral intake, found to have severe electrolyte disturbances and admitted for severe hypercalcemia and hypokalemia management.   Plan:  #Fever #Tachycardia  Patient had been afebrile until this morning around 0800, notified by RN that patient had Tmax of 102.30F before giving Tylenol . BP 159/87 and HR 122. On exam, patient tremulous, flushed, and warm to touch. She has difficulty expressing her thoughts during interview, which husband states has been waxing and waning over the past couple of months, but is worse today compared to day prior. She denies sore throat or runny nose. She does endorse a cough which  husband states has been dry and present for ~1 week. Cough is nonproductive, and has had 2-3 mild episodes of nausea and emesis after a coughing spell. No longer experiencing nausea. No known sick contacts and no recent travel. She did take a hiking trip in [place] about 9 months ago. No recent vector bites husband is aware of. 1/16 CXR without active disease. 1/16 RSV/influenza/COVID-19 swab testing negative. Patient has chronic back pain at baseline since her 03/2024 lumbar surgery, though osteomyelitis considered on the differential though unlikely. Meningitis considered, negative Brudzinski's sign. Unclear at this  time cause for her fever, though will explore infectious work up. Tachycardia improving following NS bolus and Tylenol  administration.  - f/u blood cultures  - f/u repeat UA and cultures - Tylenol  1000 mg q6 hrs prn  #Hypercalcemia, improving  Hypercalcemia > 15 on admission --> 12.4 today. Signs/symptoms include anorexia, decreased bowel output, generalized weakness, confusion, fatigue, tremors, decreased concentration, and AKI. Possible cause is immobilization in combination with thiazide diuretic versus parathyroid abnormality (primary vs. tertiary), malignancy, and possibly adrenal insufficiency.  Vitamin D  level normal, TSH normal, no prior history of hypercalcemia, no lithium use, no identifiable increased exogenous calcium  intake.  No prior PTH level.  Up-to-date on cancer screenings with colonoscopy in 2020 and planned repeat in 2027 after 3 polyps with unknown (to me) pathology were found, normal mammogram 05/2024, and prior abnormal Pap in 1983 s/p cryo procedure and normal Paps following. However, CTAP from 05/08/2024 did show 13 x 6 mm ground-glass nodule in the right lung apex with recommendation for repeat scan in 3-6 months to track. Given this new nodule, acute onset hypercalcemia and severity of hypercalcemia, greater suspicion for malignancy. Would not suspect hydrochlorothiazide   alone would cause this level of hypercalcemia, but possible. PTH pending and have ordered PTHrP which is also pending. Lower suspicion for adrenal insufficiency with normal blood pressure but she was on Budesonide  6 mg daily prior to her back surgery and discontinued without resuming. Husband does provide history that this is a medication she took for 20 years, and has intermittently discontinued it or adjusted the dosing of it based on her collagenous colitis symptoms. AM cortisol of 5.8. BP remains reassuringly normal to elevated today and Na wnl, so will hold off on ACTH  stimulation testing.  - Discontinue thiazides  - s/p 2L NS, then 200 mL an hour, target urine output 100-150 mL an hour for 8 hours  - s/p Zoledronic  acid 4 mg IV once - SQ Calcitonin 370 units BID for 4 doses  - IVF NS @ 150 mL/hr for 24 hrs - f/u PTH - f/u PTHrP - Consider nephrology consult and if PTH is low - Consider further imaging if PTH low    #AKI, improving  #Metabolic alkalosis, resolved  Creatinine increased from baseline of 0.7- 0.8 to 1.39 in the setting of decreased oral intake, severe hypercalcemia, and continue thiazide diuretic use. Cr now improving --> 1.26 Mild metabolic alkalosis on admission now resolved 33 --> 30. She was febrile to 102.85F, so will obtain repeat UA and culture as prior did not reflex to culture.  - f/u repeat UA - IV fluids as above - Hold NSAIDs and other nephrotoxic meds - If AKI worsens, plan for renal ultrasound and discuss with nephrology  #Hypokalemia, resolved  Likely caused by poor oral intake and thiazide diuretic use.  Potassium 2.5 on arrival.  Received 20 mill equivalents IV potassium already.  Magnesium  2.0.  We will be aggressive with our potassium repletion with our frequent metabolic panels to monitor her calcium  and in the setting of potassium free IV fluids. - s/p x5 IV KCl 10 mEq - s/p oral KCl 40 mEq x3 doses  - daily BMP    #Hypertension Mildly hypertensive to  normotensive on admission. She was supposed to be taking Olmesartan  20 mg daily and stop hydrochlorothiazide  25 mg daily, but per husband her Olmesartan  was being held and she continued to take hydrochlorothiazide . Given severe hypercalcemia and hypokalemia on admission, have discontinued hydrochlorothiazide  and would not resume this. Holding home  Olmesartan  20 mg daily in setting of AKI. RN to notify if SBP > 180, but may resume Olmesartan  if patient's AKI resolves and she has persistent hypertension.  - Telemetry - Page for SBP above 180   #Hypothyroidism Through chart review unknown cause of her hypothyroidism but TSH here 1.94. - Continue home levothyroxine  100 mcg daily   #Anxiety/depression with panic disorder Due to the possible neuropsychiatric effects of her severe hypercalcemia and since she has not been taking her benzodiazepines at home we will not resume this at this point but from her history provided by her husband she has not had any success from SSRIs but Wellbutrin and benzodiazepines have worked.  If otherwise she has significant improvement but still significantly anxious we will consider resuming one of her diazepam 's during admission.  #s/p Lumbar L4-L5 fusion on 04/23/2024  Underwent L4-L5 lumbar disc fusion on 04/23/2024 with Dr. Mavis for herniated disc. Surgery recovery was complicated by by a mechanical fall she sustained on 05/08/2024. She was hospitalized from 11/13 - 05/14/2024 for AMS and hypotension on admission. At that admission, CT of the chest and abdomen showed a 13 x 6 mm ground glass nodule in the right lung apex recommending CT chest 6 to 12 months. CT lumbar spine showed possible nondisplaced fracture of the left L4 transverse process with hardware intact. Neurosurgery follow-up advised conservative care and patient was maintained in a lumbar corset applied in sitting position. Patient's AMS felt to be related to polypharmacy, dehydration, and mild AKI.   Best  Practice: Diet: Regular diet IVF: Fluids: 0.9NS, Rate: 1L bolus VTE: enoxaparin  (LOVENOX ) injection 40 mg Start: 07/11/24 0930 Code: Full  Disposition planning: Therapy Recs: Pending Family Contact: Husband, at bedside. DISPO: Anticipated discharge in 2-3 days to pending pending clinical improvement.  Signature:  Doyal Miyamoto, MD [facility]  2:00 PM, 07/12/2024  On Call pager [phone]  "

## 2024-07-13 ENCOUNTER — Inpatient Hospital Stay (HOSPITAL_COMMUNITY)

## 2024-07-13 LAB — COMPREHENSIVE METABOLIC PANEL WITH GFR
ALT: 21 U/L (ref 0–44)
AST: 58 U/L — ABNORMAL HIGH (ref 15–41)
Albumin: 2.7 g/dL — ABNORMAL LOW (ref 3.5–5.0)
Alkaline Phosphatase: 102 U/L (ref 38–126)
Anion gap: 9 (ref 5–15)
BUN: 12 mg/dL (ref 8–23)
CO2: 27 mmol/L (ref 22–32)
Calcium: 10.4 mg/dL — ABNORMAL HIGH (ref 8.9–10.3)
Chloride: 103 mmol/L (ref 98–111)
Creatinine, Ser: 1.21 mg/dL — ABNORMAL HIGH (ref 0.44–1.00)
GFR, Estimated: 50 mL/min — ABNORMAL LOW
Glucose, Bld: 101 mg/dL — ABNORMAL HIGH (ref 70–99)
Potassium: 3 mmol/L — ABNORMAL LOW (ref 3.5–5.1)
Sodium: 139 mmol/L (ref 135–145)
Total Bilirubin: 0.3 mg/dL (ref 0.0–1.2)
Total Protein: 5.4 g/dL — ABNORMAL LOW (ref 6.5–8.1)

## 2024-07-13 LAB — CBC
HCT: 31.4 % — ABNORMAL LOW (ref 36.0–46.0)
Hemoglobin: 10.2 g/dL — ABNORMAL LOW (ref 12.0–15.0)
MCH: 29.3 pg (ref 26.0–34.0)
MCHC: 32.5 g/dL (ref 30.0–36.0)
MCV: 90.2 fL (ref 80.0–100.0)
Platelets: 222 K/uL (ref 150–400)
RBC: 3.48 MIL/uL — ABNORMAL LOW (ref 3.87–5.11)
RDW: 15.5 % (ref 11.5–15.5)
WBC: 7.1 K/uL (ref 4.0–10.5)
nRBC: 0 % (ref 0.0–0.2)

## 2024-07-13 MED ORDER — SODIUM CHLORIDE 0.9 % IV BOLUS
1000.0000 mL | Freq: Once | INTRAVENOUS | Status: AC
  Administered 2024-07-13: 1000 mL via INTRAVENOUS

## 2024-07-13 MED ORDER — HYDROCODONE-ACETAMINOPHEN 5-325 MG PO TABS
1.0000 | ORAL_TABLET | Freq: Four times a day (QID) | ORAL | Status: DC | PRN
  Filled 2024-07-13: qty 1

## 2024-07-13 MED ORDER — POTASSIUM CHLORIDE 20 MEQ PO PACK
40.0000 meq | PACK | Freq: Once | ORAL | Status: AC
  Administered 2024-07-13: 40 meq via ORAL
  Filled 2024-07-13: qty 2

## 2024-07-13 MED ORDER — ALPRAZOLAM 0.5 MG PO TABS
0.5000 mg | ORAL_TABLET | Freq: Every day | ORAL | Status: DC
  Administered 2024-07-13: 0.5 mg via ORAL
  Filled 2024-07-13: qty 1

## 2024-07-13 MED ORDER — ACETAMINOPHEN 325 MG PO TABS
650.0000 mg | ORAL_TABLET | Freq: Four times a day (QID) | ORAL | Status: DC
  Administered 2024-07-13 – 2024-07-14 (×5): 650 mg via ORAL
  Filled 2024-07-13 (×6): qty 2

## 2024-07-13 MED ORDER — FLUTICASONE PROPIONATE 50 MCG/ACT NA SUSP
1.0000 | Freq: Every day | NASAL | Status: DC
  Administered 2024-07-13 – 2024-07-14 (×2): 1 via NASAL
  Filled 2024-07-13: qty 16

## 2024-07-13 MED ORDER — LIDOCAINE 5 % EX PTCH
1.0000 | MEDICATED_PATCH | CUTANEOUS | Status: DC
  Administered 2024-07-14: 1 via TRANSDERMAL
  Filled 2024-07-13: qty 1

## 2024-07-13 MED ORDER — HYDROXYZINE HCL 25 MG PO TABS
25.0000 mg | ORAL_TABLET | Freq: Three times a day (TID) | ORAL | Status: DC | PRN
  Administered 2024-07-13: 25 mg via ORAL
  Filled 2024-07-13: qty 1

## 2024-07-13 NOTE — Progress Notes (Signed)
 "  HD#2 SUBJECTIVE:  Patient Summary: Jenny Yates is a 64 year old female with past medical history of hypertension, hypothyroidism, collagenous colitis previously on Budesonide , hyperlipidemia, normocytic anemia, anxiety/depression with panic disorder, and herniated lumbar disc s/p L4/L5 fusion on 04/23/2024 who presented with several weeks of persistent altered mental status and a few weeks of severely decreased oral intake, found to have severe electrolyte disturbances and admitted for severe hypercalcemia and hypokalemia management, improving but with isolated fever (Tmax 102.73F) 1/17.    Overnight Events and Interim History: NEO. Feels better today than yesterday, mentaion improved, pain manageable but notes ongoing chronic pain in her low back. She thinks she's nearly ready to go home. She complains of a stuffy nose. She would like the option to take her home xanax  for anxiety.  OBJECTIVE:  Vital Signs: Vitals:   07/13/24 0438 07/13/24 0500 07/13/24 0824 07/13/24 1236  BP:   130/65 136/81  Pulse: (!) 106  (!) 108 100  Resp: (!) 21  20 20   Temp:    97.9 F (36.6 C)  TempSrc:    Oral  SpO2: 90%  93% 96%  Weight:  85.6 kg     Supplemental O2: Room Air  Filed Weights   07/11/24 0930 07/12/24 0306 07/13/24 0500  Weight: 92.4 kg 99.7 kg 85.6 kg    Intake/Output Summary (Last 24 hours) at 07/13/2024 1316 Last data filed at 07/13/2024 1239 Gross per 24 hour  Intake 1520.74 ml  Output 2400 ml  Net -879.26 ml   Net IO Since Admission: -816.15 mL [07/13/24 1316]  Physical Exam: Physical Exam Constitutional:      General: She is not in acute distress.    Appearance: Normal appearance. She is obese. She is not ill-appearing.  Cardiovascular:     Rate and Rhythm: Regular rhythm. Tachycardia present.  Pulmonary:     Effort: Pulmonary effort is normal.     Breath sounds: Normal breath sounds. No wheezing or rales.  Abdominal:     General: Abdomen is flat.     Palpations:  Abdomen is soft.     Tenderness: There is no abdominal tenderness.  Musculoskeletal:     Right lower leg: Edema present.     Left lower leg: Edema present.     Comments: Trace edema BL  Skin:    General: Skin is warm and dry.     Comments: Facial flushing. Mild edema in extremities.  Neurological:     Mental Status: She is alert and oriented to person, place, and time.     Comments: Mildly slow central processing but much more alert and interactive than yesterday.  Psychiatric:        Mood and Affect: Mood normal.        Behavior: Behavior normal.    Patient Lines/Drains/Airways Status     Active Line/Drains/Airways     Name Placement date Placement time Site Days   Peripheral IV 07/11/24 20 G Left Antecubital 07/11/24  0615  Antecubital  2   External Urinary Catheter 07/11/24  1625  --  2   Wound 04/23/24 1654 Surgical Closed Surgical Incision Back 04/23/24  1654  Back  81   Wound 05/08/24 2145 Traumatic Knee Anterior;Left 05/08/24  2145  Knee  66   Wound 05/08/24 2145 Traumatic Elbow Posterior;Right 05/08/24  2145  Elbow  66   Wound 07/12/24 0836 Traumatic Knee Anterior;Right 07/12/24  0836  Knee  1            ASSESSMENT/PLAN:  Assessment: Principal Problem:   Hypercalcemia Active Problems:   Essential hypertension   Hypothyroidism   AKI (acute kidney injury)   Hyponatremia   Metabolic alkalosis   Acute metabolic encephalopathy   Hypokalemia   Mild protein-calorie malnutrition   Iatrogenic Cushing's syndrome due to chronic steroid for colitis  Jenny Yates is a 64 year old female with past medical history of hypertension, hypothyroidism, collagenous colitis previously on Budesonide , hyperlipidemia, normocytic anemia, anxiety/depression with panic disorder, and herniated lumbar disc s/p L4/L5 fusion on 04/23/2024 who presented with several weeks of persistent altered mental status and a few weeks of severely decreased oral intake, found to have severe electrolyte  disturbances and admitted for severe hypercalcemia and hypokalemia management, improving but with isolated fever (Tmax 102.28F) 1/17.   Plan: #Hypercalcemia, improving  #Improving encephalopathy, confusion and global slowing Resolving, now nearly within normal limits. Alongside that, mentation is drastically improved and near normal, though she remains slightly slow to process at times. Presenting signs/symptoms included anorexia, decreased bowel output, generalized weakness, confusion, fatigue, tremors, decreased concentration, and AKI. Etiology remains unclear, suspect impact from thiazide diuretic but don't think that could cause this severe presentation. PTH returned low, inconsistent with primary hyperparathyroidism. Must consider malignant and so pursuing CT CAP and multiple myeloma evaluation. Note 20 pounds unintended weight loss recently (attributed to low appetite) and a lung nodule on CT in November. - Avoid thiazides  - end fluid infusion - s/p Zoledronic  acid 4 mg IV once - SQ Calcitonin 370 units BID for 4 doses, now done - f/u PTHrP, CT CAP, light chains  #Fever #Tachycardia  #?UTI Tachycardia remains intermittent, fever has not returned but she is receiving tylenol , there is no WBC count and clinical status is improved. Do not suspcct evolving sepsis. UTI treatment was initiated yesterday per admission UA, though repeat was less concerning, but with report of dysuria and fever, will finish treatment.  - CTX 2g q 24h - f/u blood cultures  - f/u repeat UA and cultures - Tylenol  1000 mg q6 hrs prn   #AKI, improving  #Metabolic alkalosis, resolved  Creatinine baseline is 0.7- 0.8 and improving but not resolved. - Trend Cr, encourage oral intake - Hold NSAIDs and other nephrotoxic meds - If AKI persists, plan for renal ultrasound and discuss with nephrology   #Hypokalemia, resolving Likely caused by poor oral intake and thiazide diuretic use.  Will replenish as needed.    #Hypertension Mildly hypertensive to normotensive on admission. She was supposed to be taking Olmesartan  20 mg daily and stop hydrochlorothiazide  25 mg daily, but per husband her Olmesartan  was being held and she continued to take hydrochlorothiazide . Given severe hypercalcemia and hypokalemia on admission, have discontinued hydrochlorothiazide  and would not resume this. Holding home Olmesartan  20 mg daily in setting of AKI. RN to notify if SBP > 180, but may resume Olmesartan  if patient's AKI resolves and she has persistent hypertension.  - Telemetry - Page for SBP above 180   Chronic Stable Issues  #Hypothyroidism Through chart review unknown cause of her hypothyroidism but TSH here 1.94. - Continue home levothyroxine  100 mcg daily   #Anxiety/depression with panic disorder Resume xanax  0.5 at bedtime prn, her PTH regimen, now that mentation is improving.   #s/p Lumbar L4-L5 fusion on 04/23/2024  Underwent L4-L5 lumbar disc fusion on 04/23/2024 with Dr. Mavis for herniated disc. Surgery recovery was complicated by by a mechanical fall she sustained on 05/08/2024. She was hospitalized from 11/13 - 05/14/2024 for AMS and hypotension on  admission. At that admission, CT of the chest and abdomen showed a 13 x 6 mm ground glass nodule in the right lung apex recommending CT chest 6 to 12 months. CT lumbar spine showed possible nondisplaced fracture of the left L4 transverse process with hardware intact. Neurosurgery follow-up advised conservative care and patient was maintained in a lumbar corset applied in sitting position. Patient's AMS felt to be related to polypharmacy, dehydration, and mild AKI.   Best Practice: Diet: Regular diet VTE: enoxaparin  (LOVENOX ) injection 40 mg Start: 07/11/24 0930 Code: Full   Disposition planning: Therapy Recs: HH PT/OT Family Contact: Husband, at bedside. DISPO: Anticipated discharge in 2-3 days to pending pending clinical  improvement.  Signature: Lonni Africa, D.O.  Internal Medicine Resident, PGY-2 [facility]  Pager: # [phone]. 1:16 PM, 07/13/2024 "

## 2024-07-13 NOTE — Plan of Care (Signed)

## 2024-07-14 DIAGNOSIS — F410 Panic disorder [episodic paroxysmal anxiety] without agoraphobia: Secondary | ICD-10-CM | POA: Diagnosis not present

## 2024-07-14 DIAGNOSIS — E873 Alkalosis: Secondary | ICD-10-CM | POA: Diagnosis not present

## 2024-07-14 DIAGNOSIS — E876 Hypokalemia: Secondary | ICD-10-CM | POA: Diagnosis not present

## 2024-07-14 DIAGNOSIS — I10 Essential (primary) hypertension: Secondary | ICD-10-CM | POA: Diagnosis not present

## 2024-07-14 DIAGNOSIS — N179 Acute kidney failure, unspecified: Secondary | ICD-10-CM | POA: Diagnosis not present

## 2024-07-14 DIAGNOSIS — E039 Hypothyroidism, unspecified: Secondary | ICD-10-CM | POA: Diagnosis not present

## 2024-07-14 LAB — BASIC METABOLIC PANEL WITH GFR
Anion gap: 12 (ref 5–15)
BUN: 12 mg/dL (ref 8–23)
CO2: 22 mmol/L (ref 22–32)
Calcium: 9.1 mg/dL (ref 8.9–10.3)
Chloride: 102 mmol/L (ref 98–111)
Creatinine, Ser: 1.08 mg/dL — ABNORMAL HIGH (ref 0.44–1.00)
GFR, Estimated: 57 mL/min — ABNORMAL LOW
Glucose, Bld: 79 mg/dL (ref 70–99)
Potassium: 2.9 mmol/L — ABNORMAL LOW (ref 3.5–5.1)
Sodium: 136 mmol/L (ref 135–145)

## 2024-07-14 LAB — CBC
HCT: 32 % — ABNORMAL LOW (ref 36.0–46.0)
Hemoglobin: 10.4 g/dL — ABNORMAL LOW (ref 12.0–15.0)
MCH: 29.2 pg (ref 26.0–34.0)
MCHC: 32.5 g/dL (ref 30.0–36.0)
MCV: 89.9 fL (ref 80.0–100.0)
Platelets: 217 K/uL (ref 150–400)
RBC: 3.56 MIL/uL — ABNORMAL LOW (ref 3.87–5.11)
RDW: 15.4 % (ref 11.5–15.5)
WBC: 7.3 K/uL (ref 4.0–10.5)
nRBC: 0 % (ref 0.0–0.2)

## 2024-07-14 LAB — RESP PANEL BY RT-PCR (RSV, FLU A&B, COVID)  RVPGX2
Influenza A by PCR: NEGATIVE
Influenza B by PCR: NEGATIVE
Resp Syncytial Virus by PCR: NEGATIVE
SARS Coronavirus 2 by RT PCR: NEGATIVE

## 2024-07-14 LAB — KAPPA/LAMBDA LIGHT CHAINS
Kappa free light chain: 49 mg/L — ABNORMAL HIGH (ref 3.3–19.4)
Kappa, lambda light chain ratio: 1.01 (ref 0.26–1.65)
Lambda free light chains: 48.4 mg/L — ABNORMAL HIGH (ref 5.7–26.3)

## 2024-07-14 LAB — CALCIUM, IONIZED: Calcium, Ionized, Serum: 7.6 mg/dL — ABNORMAL HIGH (ref 4.5–5.6)

## 2024-07-14 LAB — PTH, INTACT AND CALCIUM
Calcium, Total (PTH): 14.3 mg/dL (ref 8.7–10.3)
PTH: 7 pg/mL — ABNORMAL LOW (ref 15–65)

## 2024-07-14 MED ORDER — POTASSIUM CHLORIDE 20 MEQ PO PACK
40.0000 meq | PACK | Freq: Once | ORAL | Status: DC
  Filled 2024-07-14: qty 2

## 2024-07-14 MED ORDER — POTASSIUM CHLORIDE CRYS ER 20 MEQ PO TBCR
40.0000 meq | EXTENDED_RELEASE_TABLET | Freq: Once | ORAL | Status: AC
  Administered 2024-07-14: 40 meq via ORAL
  Filled 2024-07-14: qty 2

## 2024-07-14 MED ORDER — POTASSIUM CHLORIDE 20 MEQ PO PACK
40.0000 meq | PACK | Freq: Once | ORAL | Status: AC
  Administered 2024-07-14: 40 meq via ORAL
  Filled 2024-07-14: qty 2

## 2024-07-14 MED ORDER — POTASSIUM CHLORIDE CRYS ER 20 MEQ PO TBCR: 20.0000 meq | EXTENDED_RELEASE_TABLET | Freq: Every day | ORAL | 0 refills | Status: DC

## 2024-07-14 NOTE — Progress Notes (Signed)
 Jenny Yates to be D/C'd Home per MD order.  Discussed with the patient and all questions fully answered.  VSS, Skin clean, dry and intact without evidence of skin break down, no evidence of skin tears noted. IV catheter discontinued intact. Site without signs and symptoms of complications. Dressing and pressure applied.  An After Visit Summary was printed and given to the patient. Patient received prescription.  D/c education completed with patient/family including follow up instructions, medication list, d/c activities limitations if indicated, with other d/c instructions as indicated by MD - patient able to verbalize understanding, all questions fully answered.   Patient instructed to return to ED, call 911, or call MD for any changes in condition.   Patient escorted via WC, and D/C home via private auto.  Rocky HERO [name] 07/14/2024 3:01 PM

## 2024-07-14 NOTE — Discharge Summary (Signed)
 "  Name: Jenny Yates MRN: [id number] DOB: 1961/01/28 64 y.o. PCP: Pcp, No  Date of Admission: 07/11/2024  4:55 AM Date of Discharge: 07/14/2024 Attending Physician: Dr. Jone Yates  Discharge Diagnosis: 1. Principal Problem:   Hypercalcemia Active Problems:   Essential hypertension   Hypothyroidism   AKI (acute kidney injury)   Hyponatremia   Metabolic alkalosis   Acute metabolic encephalopathy   Hypokalemia   Mild protein-calorie malnutrition   Iatrogenic Cushing's syndrome due to chronic steroid for colitis    Discharge Medications: Allergies as of 07/14/2024       Reactions   Codeine Other (See Comments)   Syncope knocks me to the floor   Epinephrine  Other (See Comments)   Oral seizure-like activity   Azithromycin Rash   Iodine Swelling, Rash        Medication List     PAUSE taking these medications    budesonide  3 MG 24 hr capsule Wait to take this until your doctor or other care provider tells you to start again. Commonly known as: ENTOCORT EC  Take 2 capsules (6 mg total) by mouth daily.   diazepam  5 MG tablet Wait to take this until your doctor or other care provider tells you to start again. Commonly known as: VALIUM  Take 1 tablet (5 mg total) by mouth every 12 (twelve) hours as needed for anxiety. What changed: when to take this   nebivolol  10 MG tablet Wait to take this until your doctor or other care provider tells you to start again. Commonly known as: BYSTOLIC  Take 20 mg by mouth daily.   olmesartan  20 MG tablet Wait to take this until your doctor or other care provider tells you to start again. Commonly known as: BENICAR  Take 1 tablet (20 mg total) by mouth in the morning and at bedtime.       STOP taking these medications    acetaminophen  500 MG tablet Commonly known as: TYLENOL    hydrochlorothiazide  25 MG tablet Commonly known as: HYDRODIURIL        TAKE these medications    ALPRAZolam  0.5 MG tablet Commonly known as:  Xanax  Take 1 tablet (0.5 mg total) by mouth at bedtime as needed for anxiety.   celecoxib  200 MG capsule Commonly known as: CELEBREX  Take 1 capsule (200 mg total) by mouth daily.   cyclobenzaprine  5 MG tablet Commonly known as: FLEXERIL  Take 1 tablet (5 mg total) by mouth 3 (three) times daily as needed for muscle spasms. What changed: when to take this   fluticasone  50 MCG/ACT nasal spray Commonly known as: FLONASE  Place 1 spray into both nostrils daily as needed for allergies or rhinitis.   HYDROcodone -acetaminophen  5-325 MG tablet Commonly known as: NORCO/VICODIN Take 1 tablet by mouth daily as needed for moderate pain (pain score 4-6).   levothyroxine  100 MCG tablet Commonly known as: SYNTHROID  Take 1 tablet by mouth once daily   Magnesium  Oxide 250 MG Tabs Take 250 mg by mouth at bedtime.   multivitamin with minerals Tabs tablet Take 1 tablet by mouth in the morning.   rosuvastatin  40 MG tablet Commonly known as: Crestor  Take 1 tablet (40 mg total) by mouth daily. What changed: when to take this   VITAMIN D3 PO Take 2,000 Units by mouth at bedtime.        Disposition and follow-up:   [name] was discharged from [facility] in Stable condition.  At the hospital follow up visit please address:  1.  HTN:  Patient was supposed to be managed with Olmesartan  20 mg daily by chart review, and supposed to be discontinued from hydrochlorothiazide  25 mg daily. However, PTA she was taking hydrochlorothiazide  but not taking Olmesartan . Please ensure that patient is NOT taking hydrochlorothiazide . Olmesartan  was held during admission as we monitored BP. She was normotensive on discharge and with mild AKI though improving, would recommend repeat BMP and BP monitor, and resume Olmesartan  20 mg daily as clinically appropriate. - obtain BMP - monitor BP - resume Olmesartan  20 mg daily as clinically appropriate   Hypercalcemia work up: So far unrevealing  to cause other than thiazide use. MM work up still pending. Please continue to follow this work up outpatient.    2.  Labs / imaging needed at time of follow-up: BMP, ionized Calcium , CBC  3.  Pending labs/ test needing follow-up: PTHrP, Kappa/lambda light chains, protein electrophoresis, final blood culture results (NGTD at 2 days), COVID19/flu/RSV panel   Follow-up Appointments:  Follow-up Information     Ngetich, Jenny C, NP. Go on 07/22/2024.   Specialty: Family Medicine Why: Please go to your visit with Jenny Yates on 07/22/2024 at 10:20am. Please arrive for your appointment by 10:00am. Please be sure to bring all of your medications in their original bottles with you to this appointment. Contact information: [address] [place] [address] [phone]         Jenny Gaines, DO. Go on 07/17/2024.   Specialty: Internal Medicine Why: Go to your LAB ONLY appointment on 07/17/2024 to repeat BMP and ionized Calcium  labs until you can see your new PCP on 1/27. You will not be seeing a provider at this visit, this is for lab draw only. Contact information: [address] [place] [address] [phone]                  Hospital Course by problem list: Jenny Yates is a 64 year old female with past medical history of hypertension, hypothyroidism, collagenous colitis previously on Budesonide , hyperlipidemia, normocytic anemia, anxiety/depression with panic disorder, and herniated lumbar disc s/p L4/L5 fusion on 04/23/2024 who presented with several weeks of persistent altered mental status and a few weeks of severely decreased oral intake, found to have severe electrolyte disturbances and admitted for severe hypercalcemia and hypokalemia management  now being discharged on hospital day 3 with the following pertinent hospital course:  #Non-PTH mediated Hypercalcemia #Tremors #Decreased concentration  Hypercalcemia >15 on admission. Signs/symptoms included  anorexia, decreased bowel output, generalized weakness, confusion, fatigue, tremors, decreased concentration, and AKI. Possible cause was immobilization in combination with thiazide diuretic versus parathyroid abnormality (primary vs. tertiary), malignancy, and possibly adrenal insufficiency.  Lower suspicion for adrenal insufficiency with normal blood pressure but she was on Budesonide  6 mg daily prior to her back surgery and discontinued without resuming. Husband provided history that this was a medication she took for 20 years, and had intermittently discontinued it or adjusted the dosing of it based on her collagenous colitis symptoms. AM cortisol of 5.8. BP remained reassuringly normal to elevated during hospitalization and Na wnl, so held off on ACTH  stimulation testing. Vitamin D  level normal, TSH normal, no prior history of hypercalcemia, no lithium use, no identifiable increased exogenous calcium  intake. Of note, by chart review from PCP notes, she was supposed to discontinue hydrochlorothiazide  and utilize Olmesartan  for BP control, but she was taking hydrochlorothiazide  and holding her Olmesartan  for the past few weeks. No prior PTH level.  Up-to-date on cancer screenings with  colonoscopy in 2020 and planned repeat in 2027 after 3 polyps with unknown (to me) pathology were found, normal mammogram 05/2024, and prior abnormal Pap in 1983 s/p cryo procedure and normal Paps following. However, CTAP from 05/08/2024 did show 13 x 6 mm ground-glass nodule in the right lung apex with recommendation for repeat scan in 3-6 months to track. Given this new nodule, acute onset hypercalcemia and severity of hypercalcemia, felt greater suspicion for malignancy. Would not suspect hydrochlorothiazide  alone would cause this level of hypercalcemia, but possible. PTH low and ordered PTHrP which is also still pending. Repeat 1/18 CTAP imaging did not show evidence of acute process or malignancy. Kappa/lambda light chains and  protein electrophoresis labs still pending. She was given IVF NS, Zoledronic  acid, calcitonin, and potassium repletion which improved calcium  and potassium levels during admission. By day of discharge her calcium  improved to 9.1. She improved in terms of mentation, tremors, and overall pain. On 1/17, she did spike a 102.30F fever, without leukocytosis. Infectious workup was unrevealing and felt likely due to her hypercalcemia. Final blood cultures still pending, but are negative at 2 days. By day of discharge, she was overall improved. Etiology of her electrolyte derangement unclear at this time. Likely in part due to thiazide use, though unlikely this is the sole cause and would recommend continued outpatient work up. On day of discharge she did spike one more fever to 101, though mentation continued to be improved and patient not with any new symptoms. Given infectious work up unrevealing thus far, likely reactive in the setting of hypercalcemia. Repeat RSV/COVID-19/influenza swab was repeated. Husband and patient felt comfortable with discharge to home and monitoring her at home with the fever.  - Continue outpatient workup for etiology of hypercalcemia - Discontinue hydrochlorothiazide   - f/u final blood culture results (NG at 2 days) - f/u Kappa/lamda light chains lab - f/u Protein electrophoresis lab - f/u PTHrP  - repeat BMP in 5-7 days    #Hypokalemia Likely caused by poor oral intake and thiazide diuretic use.  Potassium 2.5 on arrival. Magnesium  2.0. Given aggressive IV and oral potassium repletion and frequent metabolic panels to monitor her calcium  and potassium. On day of discharge potassium noted to be 3.0 on AM labs, given 80 mEq of KCl.  - repeat BMP in 5-7 days    #AKI, improving  #Metabolic alkalosis, resolved  Creatinine increased from baseline of ~ 0.7 - 0.8 to 1.39 on admission in the setting of decreased oral intake, severe hypercalcemia, and continue thiazide diuretic use.  She  also had a mild metabolic alkalosis with a bicarb of 35.  With her altered mentation and severe electrolyte abnormalities obtained a blood gas but this to resolve with treatments as above. Initial UA hazy with large leukocytes, few bacteria, and small Hgb, though did not reflex to culture. Patient did spike a fever on 1/17 and obtained repeat UA which was not concerning for UTI. Treated prophylactically with Ceftriaxone  for 3 days just in case. By day of discharge, Cr continued to improve to 1.08. She was managed with IVF, and avoided nephrotoxic drugs as able. Would recommend repeat BMP at follow up to ensure resolution.  - encourage po intake - repeat BMP in 5-7 days   #Hypertension Mildly hypertensive to normotensive here. By chart review from PCP notes, she was supposed to discontinue hydrochlorothiazide  and utilize Olmesartan  for BP control, but she was taking hydrochlorothiazide  and holding her Olmesartan  for the past few weeks. Discontinued hydrochlorothiazide . During admission  her BP was monitored and she was normotensive by day of discharge without having resumed Olmesartan  in setting of AKI.  - repeat BMP in 5-7 days, at that point if AKI resolved and clinically appropriate, then would recommend resuming Olmesartan    #Hypothyroidism Through chart review unknown cause of her hypothyroidism but TSH here 1.94. Continued levothyroxine  100 mcg daily during admission. - Continue levothyroxine  100 mcg daily     #Anxiety/depression with panic disorder Due to the possible neuropsychiatric effects of her severe hypercalcemia and since she has not been taking her benzodiazepines at home we will not resume this at this point but from her history provided by her husband she has not had any success from SSRIs but Wellbutrin and benzodiazepines have worked. Resumed her home Xanax .  #s/p Lumbar L4-L5 fusion on 04/23/2024  Underwent L4-L5 lumbar disc fusion on 04/23/2024 with Dr. Mavis for herniated disc.  Surgery recovery was complicated by by a mechanical fall she sustained on 05/08/2024. She was hospitalized from 11/13 - 05/14/2024 for AMS and hypotension on admission. At that admission, CT of the chest and abdomen showed a 13 x 6 mm ground glass nodule in the right lung apex recommending CT chest 6 to 12 months. CT lumbar spine showed possible nondisplaced fracture of the left L4 transverse process with hardware intact. Neurosurgery follow-up advised conservative care and patient was maintained in a lumbar corset applied in sitting position. Patient's AMS felt to be related to polypharmacy, dehydration, and mild AKI.     Subjective Overall she feels well and more like her normal self. She has ongoing chronic pain but it is manageable. She does not report confusion. She is pleased that her CT report was negative. She feels ready to discharge home with HHPT.   Discharge Exam:   BP 127/77 (BP Location: Right Wrist)   Pulse 91   Temp 98.7 F (37.1 Yates) (Oral)   Resp (!) 22   Ht 4' 10 (1.473 m)   Wt 86.2 kg   SpO2 97%   BMI 39.72 kg/m   Physical Exam:   Constitutional: obese-appearing female sitting in hospital bed, in no acute distress HEENT: normocephalic atraumatic, mucous membranes moist, facial flushing present  Cardiovascular: regular rate and rhythm, bilateral radial pulses 2+, bilateral dorsal pedal pulses 2+, brisk capillary refill bilateral feet and hands; edema present bilateral LE  Pulmonary/Chest: normal work of breathing on room air, lungs clear to auscultation bilaterally Abdominal: soft, non-tender, non-distended MSK: normal bulk and tone. Neurological: alert & oriented x 3, improved mentation and conversing appropriately  Skin: warm and dry, venous stasis changes bilateral LE  Psych: mood calm, behavior normal, thought content normal, judgement normal    Pertinent Labs, Studies, and Procedures:     Latest Ref Rng & Units 07/14/2024    3:12 AM 07/13/2024    2:54 AM  07/12/2024    7:08 AM  CBC  WBC 4.0 - 10.5 K/uL 7.3  7.1  6.4   Hemoglobin 12.0 - 15.0 g/dL 89.5  89.7  88.9   Hematocrit 36.0 - 46.0 % 32.0  31.4  33.7   Platelets 150 - 400 K/uL 217  222  255        Latest Ref Rng & Units 07/14/2024    3:12 AM 07/13/2024    2:54 AM 07/12/2024    7:08 AM  CMP  Glucose 70 - 99 mg/dL 79  898  882   BUN 8 - 23 mg/dL 12  12  13  Creatinine 0.44 - 1.00 mg/dL 8.91  8.78  8.73   Sodium 135 - 145 mmol/L 136  139  138   Potassium 3.5 - 5.1 mmol/L 2.9  3.0  3.5   Chloride 98 - 111 mmol/L 102  103  101   CO2 22 - 32 mmol/L 22  27  30    Calcium  8.9 - 10.3 mg/dL 9.1  89.5  87.5   Total Protein 6.5 - 8.1 g/dL  5.4  5.6   Total Bilirubin 0.0 - 1.2 mg/dL  0.3  0.4   Alkaline Phos 38 - 126 U/L  102  114   AST 15 - 41 U/L  58  49   ALT 0 - 44 U/L  21  19     DG Chest Portable 1 View Result Date: 07/11/2024 CLINICAL DATA:  Cough EXAM: PORTABLE CHEST 1 VIEW COMPARISON:  May 14, 2024 FINDINGS: Stable cardiomediastinal silhouette. Both lungs are clear. The visualized skeletal structures are unremarkable. IMPRESSION: No active disease. Electronically Signed   By: Lynwood Landy Raddle M.D.   On: 07/11/2024 07:47     Discharge Instructions: Discharge Instructions     Call MD for:  difficulty breathing, headache or visual disturbances   Complete by: As directed    Call MD for:  extreme fatigue   Complete by: As directed    Call MD for:  persistant dizziness or light-headedness   Complete by: As directed    Call MD for:  persistant nausea and vomiting   Complete by: As directed    Call MD for:  redness, tenderness, or signs of infection (pain, swelling, redness, odor or green/yellow discharge around incision site)   Complete by: As directed    Call MD for:  severe uncontrolled pain   Complete by: As directed    Increase activity slowly   Complete by: As directed    No wound care   Complete by: As directed          Discharge Instructions      To Ms.  Jenny Yates,  You were admitted to [facility] on 07/11/2024 for evaluation and treatment of: confusion, altered mental status and confusion   The evaluation suggested that you had severe electrolyte abnormalities including elevated calcium  and low potassium levels. You were treated with IV fluids, Zoledronic  acid, calcitonin, potassium supplementation, and close monitoring. The cause of your elevated calcium  is unclear at this time. It was likely in-part to the hydrochlorothiazide  blood pressure medication you were taking. We discontinued the hydrochlorothiazide  when you were admitted and advise that you DO NOT continue to take this. Your CT chest, abdomen, pelvis did not show evidence of acute pathology or malignancy, it did incidentally show a fatty liver. Your Olmesartan  was held during admission to monitor your blood pressure without medication, and because of your kidney injury. By day of discharge, your kidney injury is improving and only mildly elevated from your baseline. You do have labs that are pending final result, and recommend that you follow up with your new PCP within 1 week of discharge from the hospital to recheck your electrolytes and final results. We have scheduled you for a lab only visit to recheck your electrolyte levels and kidney function on 07/17/2024 before you see your PCP. This is a lab only visit (listed below).   You were discharged from the hospital on 07/14/24. I recommend the following after leaving the hospital:   Medications Adjusted During Hospital Admission:  1)  DO NOT TAKE hydrochlorothiazide    2) DO NOT TAKE Olmesartan  until instructed by your PCP. Please monitor your BP at home in the next week.    Please make sure you follow up:   Follow-up Information     Ngetich, Jenny C, NP. Go on 07/22/2024.   Specialty: Family Medicine Why: Please go to your visit with Jenny Yates on 07/22/2024 at 10:20am. Please arrive for your appointment by  10:00am. Please be sure to bring all of your medications in their original bottles with you to this appointment. Contact information: [address] [place] [address] [phone]         Jenny Gaines, DO. Go on 07/17/2024.   Specialty: Internal Medicine Why: Go to your LAB ONLY appointment on 07/17/2024 to repeat BMP and ionized Calcium  labs until you can see your new PCP on 1/27. You will not be seeing a provider at this visit, this is for lab draw only. Contact information: [address], [address] [place] [address] [phone]                   For questions about your care plan, until you are able to see your primary doctor: Call [phone]. Dial 0 for the operator. Ask for the internal medicine resident on call.  Thank you for allowing us  to be part of your care.   Doyal Miyamoto, MD 07/14/2024, 11:22 AM         Signed: Doyal Miyamoto, MD 07/14/2024, 1:07 PM    "

## 2024-07-14 NOTE — TOC Transition Note (Signed)
 Transition of Care (TOC) - Discharge Note Rayfield Gobble RN, BSN Inpatient Care Management Unit 4E- RN Case Manager See Treatment Team for direct phone #   Patient Details  Name: Jenny Yates MRN: [id number] Date of Birth: [birth year]  Transition of Care [facility]) CM/SW Contact:  Gobble Rayfield Hurst, RN Phone Number: 07/14/2024, 2:08 PM   Clinical Narrative:    Pt stable for transition home today, Order placed for HHPT.   CM in to speak with pt and spouse regarding transition needs. Discussed DME- spouse/pt do not feel she needs BSC as bathroom is close, also declined wheelchair at this time voicing they don't feel she needs wheelchair- confirm they have needed DME at home.  Pt states she has been going to outpt therapy now wants to see if [facility] can be arranged.  List provided for [facility] choice Per CMS guidelines from phonefinancing.pl website with star ratings (copy placed in shadow chart)- pt/spouse voiced that they do not have a preference for [facility] and are agreeable to having referral sent out in the Hub to see who might accept.   Address, phone # and PCP f/u confirmed- pt/spouse voiced that pt's former PCP left the practice and she is to see new PCP next week with [facility] on Jan. 27.   Explained to pt and spouse that [facility] may be delayed until she sees new PCP as they need provider for orders. Pt/spouse voiced understanding.   Referral for [facility] sent out via Hub- [name] has accepted- and CM confirmed with liaison. They will follow up once pt goes to her new PCP next week.   Spouse asking about d/c meds- deferred this to bedside RN to check with attending.   IP CM interventions have been completed no further needs noted.     Final next level of care: Home w Home Health Services Barriers to Discharge: Barriers Resolved   Patient Goals and CMS Choice Patient states their goals for this hospitalization and ongoing recovery are:: return home CMS Medicare.gov Compare Post Acute  Care list provided to:: Patient Choice offered to / list presented to : Patient, Spouse      Discharge Placement               Home w/ [facility]        Discharge Plan and Services Additional resources added to the After Visit Summary for     Discharge Planning Services: CM Consult Post Acute Care Choice: Home Health, Durable Medical Equipment          DME Arranged: N/A DME Agency: NA       HH Arranged: PT HH Agency: [facility] Date [facility] Agency Contacted: 07/14/24 Time HH Agency Contacted: 1408 Representative spoke with at [facility] Agency: Hub/Cory  Social Drivers of Health (SDOH) Interventions SDOH Screenings   Food Insecurity: No Food Insecurity (07/11/2024)  Housing: Low Risk (07/11/2024)  Transportation Needs: No Transportation Needs (07/11/2024)  Utilities: Not At Risk (07/11/2024)  Depression (PHQ2-9): Low Risk (08/24/2023)  Tobacco Use: Medium Risk (07/11/2024)     Readmission Risk Interventions    07/14/2024    2:08 PM  Readmission Risk Prevention Plan  Transportation Screening Complete  PCP or Specialist Appt within 5-7 Days Complete  Home Care Screening Complete  Medication Review (RN CM) Complete

## 2024-07-14 NOTE — Plan of Care (Signed)

## 2024-07-14 NOTE — Discharge Instructions (Addendum)
 To Ms. Jenny Yates,  You were admitted to [facility] on 07/11/2024 for evaluation and treatment of: confusion, altered mental status and confusion   The evaluation suggested that you had severe electrolyte abnormalities including elevated calcium  and low potassium levels. You were treated with IV fluids, Zoledronic  acid, calcitonin, potassium supplementation, and close monitoring. The cause of your elevated calcium  is unclear at this time. It was likely in-part to the hydrochlorothiazide  blood pressure medication you were taking. We discontinued the hydrochlorothiazide  when you were admitted and advise that you DO NOT continue to take this. Your CT chest, abdomen, pelvis did not show evidence of acute pathology or malignancy, it did incidentally show a fatty liver. Your Olmesartan  was held during admission to monitor your blood pressure without medication, and because of your kidney injury. By day of discharge, your kidney injury is improving and only mildly elevated from your baseline. You do have labs that are pending final result, and recommend that you follow up with your new PCP within 1 week of discharge from the hospital to recheck your electrolytes and final results. We have scheduled you for a lab only visit to recheck your electrolyte levels and kidney function on 07/17/2024 before you see your PCP. This is a lab only visit (listed below).   You were discharged from the hospital on 07/14/24. I recommend the following after leaving the hospital:   Medications Adjusted During Hospital Admission:  1) DO NOT TAKE hydrochlorothiazide    2) DO NOT TAKE Olmesartan  until instructed by your PCP. Please monitor your BP at home in the next week.    Please make sure you follow up:   Follow-up Information     Ngetich, Dinah C, NP. Go on 07/22/2024.   Specialty: Family Medicine Why: Please go to your visit with Dr. Leonarda on 07/22/2024 at 10:20am. Please arrive for your appointment by  10:00am. Please be sure to bring all of your medications in their original bottles with you to this appointment. Contact information: [address] [place] [address] [phone]         Tobie Gaines, DO. Go on 07/17/2024.   Specialty: Internal Medicine Why: Go to your LAB ONLY appointment on 07/17/2024 to repeat BMP and ionized Calcium  labs until you can see your new PCP on 1/27. You will not be seeing a provider at this visit, this is for lab draw only. Contact information: [address] [place] [address] [phone]                   For questions about your care plan, until you are able to see your primary doctor: Call [phone]. Dial 0 for the operator. Ask for the internal medicine resident on call.  Thank you for allowing us  to be part of your care.   Doyal Miyamoto, MD 07/14/2024, 11:22 AM

## 2024-07-15 LAB — PROTEIN ELECTROPHORESIS, SERUM
A/G Ratio: 0.8 (ref 0.7–1.7)
Albumin ELP: 2.1 g/dL — ABNORMAL LOW (ref 2.9–4.4)
Alpha-1-Globulin: 0.3 g/dL (ref 0.0–0.4)
Alpha-2-Globulin: 0.7 g/dL (ref 0.4–1.0)
Beta Globulin: 0.7 g/dL (ref 0.7–1.3)
Gamma Globulin: 0.9 g/dL (ref 0.4–1.8)
Globulin, Total: 2.6 g/dL (ref 2.2–3.9)
Total Protein ELP: 4.7 g/dL — ABNORMAL LOW (ref 6.0–8.5)

## 2024-07-17 ENCOUNTER — Other Ambulatory Visit: Payer: Self-pay

## 2024-07-17 ENCOUNTER — Other Ambulatory Visit: Payer: Self-pay | Admitting: Family Medicine

## 2024-07-17 LAB — CULTURE, BLOOD (ROUTINE X 2)
Culture: NO GROWTH
Culture: NO GROWTH

## 2024-07-17 LAB — BASIC METABOLIC PANEL WITH GFR
Anion gap: 17 — ABNORMAL HIGH (ref 5–15)
BUN: 18 mg/dL (ref 8–23)
CO2: 19 mmol/L — ABNORMAL LOW (ref 22–32)
Calcium: 9.2 mg/dL (ref 8.9–10.3)
Chloride: 100 mmol/L (ref 98–111)
Creatinine, Ser: 2.26 mg/dL — ABNORMAL HIGH (ref 0.44–1.00)
GFR, Estimated: 24 mL/min — ABNORMAL LOW
Glucose, Bld: 101 mg/dL — ABNORMAL HIGH (ref 70–99)
Potassium: 3.4 mmol/L — ABNORMAL LOW (ref 3.5–5.1)
Sodium: 136 mmol/L (ref 135–145)

## 2024-07-17 NOTE — Progress Notes (Signed)
 I called patient about her follow up lab.  Calcium  remains normal at 9.2 however her Serum Creatinine has jumped in 3 days to 2.26.  I inquired about her medications and she put me on with her husband [name] who handles her meds.  [name] confirmed she has stopped hydrochlorothiazide  and olmesartan .  He has had trouble getting her to drink fluids but is working on it.  He will reinforce it more but she is not confused like she was before.  We reviewed that if she continues to not take in fluids or becomes more confused she should go to the hosptial.  She has follow up next week with her new PCP.  In the interium I offered that he may call the [facility]/ and or come in next week for a recheck.

## 2024-07-18 ENCOUNTER — Ambulatory Visit: Admitting: Family Medicine

## 2024-07-18 LAB — CALCIUM, IONIZED: Calcium, Ionized, Serum: 4.9 mg/dL (ref 4.5–5.6)

## 2024-07-20 LAB — PTH-RELATED PEPTIDE: PTH-related peptide: <2 pmol/L

## 2024-07-22 ENCOUNTER — Other Ambulatory Visit (HOSPITAL_COMMUNITY): Payer: Self-pay

## 2024-07-22 ENCOUNTER — Ambulatory Visit: Admitting: Family

## 2024-07-22 ENCOUNTER — Encounter: Payer: Self-pay | Admitting: Family

## 2024-07-22 VITALS — BP 132/82 | HR 102 | Temp 98.8°F | Ht <= 58 in | Wt 193.6 lb

## 2024-07-22 DIAGNOSIS — S81801A Unspecified open wound, right lower leg, initial encounter: Secondary | ICD-10-CM | POA: Diagnosis not present

## 2024-07-22 DIAGNOSIS — E7800 Pure hypercholesterolemia, unspecified: Secondary | ICD-10-CM | POA: Diagnosis not present

## 2024-07-22 DIAGNOSIS — I10 Essential (primary) hypertension: Secondary | ICD-10-CM | POA: Diagnosis not present

## 2024-07-22 DIAGNOSIS — Z1159 Encounter for screening for other viral diseases: Secondary | ICD-10-CM

## 2024-07-22 DIAGNOSIS — R600 Localized edema: Secondary | ICD-10-CM | POA: Diagnosis not present

## 2024-07-22 DIAGNOSIS — R296 Repeated falls: Secondary | ICD-10-CM | POA: Diagnosis not present

## 2024-07-22 DIAGNOSIS — E039 Hypothyroidism, unspecified: Secondary | ICD-10-CM | POA: Diagnosis not present

## 2024-07-22 DIAGNOSIS — M150 Primary generalized (osteo)arthritis: Secondary | ICD-10-CM | POA: Diagnosis not present

## 2024-07-22 DIAGNOSIS — G894 Chronic pain syndrome: Secondary | ICD-10-CM | POA: Diagnosis not present

## 2024-07-22 DIAGNOSIS — R2681 Unsteadiness on feet: Secondary | ICD-10-CM

## 2024-07-22 DIAGNOSIS — D8989 Other specified disorders involving the immune mechanism, not elsewhere classified: Secondary | ICD-10-CM

## 2024-07-22 DIAGNOSIS — F411 Generalized anxiety disorder: Secondary | ICD-10-CM

## 2024-07-22 DIAGNOSIS — N1832 Chronic kidney disease, stage 3b: Secondary | ICD-10-CM

## 2024-07-22 DIAGNOSIS — Z7689 Persons encountering health services in other specified circumstances: Secondary | ICD-10-CM | POA: Diagnosis not present

## 2024-07-22 MED ORDER — DOXYCYCLINE HYCLATE 100 MG PO TABS: 100.0000 mg | ORAL_TABLET | Freq: Two times a day (BID) | ORAL | 0 refills | Status: AC

## 2024-07-22 MED ORDER — TORSEMIDE 20 MG PO TABS: ORAL_TABLET | ORAL | 3 refills | Status: AC

## 2024-07-22 MED ORDER — HYDROGEL GEL
1.0000 | 3 refills | Status: AC
  Filled 2024-07-22: qty 100, fill #0

## 2024-07-22 MED ORDER — HYDROGEL GEL: 1.0000 | 3 refills | Status: DC

## 2024-07-22 MED ORDER — POTASSIUM CHLORIDE CRYS ER 20 MEQ PO TBCR: 20.0000 meq | EXTENDED_RELEASE_TABLET | Freq: Every day | ORAL | 3 refills | Status: AC

## 2024-07-23 ENCOUNTER — Other Ambulatory Visit (HOSPITAL_COMMUNITY): Payer: Self-pay

## 2024-07-23 LAB — COMPREHENSIVE METABOLIC PANEL WITH GFR
AG Ratio: 1.1 (calc) (ref 1.0–2.5)
ALT: 19 U/L (ref 6–29)
AST: 40 U/L — ABNORMAL HIGH (ref 10–35)
Albumin: 2.9 g/dL — ABNORMAL LOW (ref 3.6–5.1)
Alkaline phosphatase (APISO): 161 U/L — ABNORMAL HIGH (ref 37–153)
BUN/Creatinine Ratio: 8 (calc) (ref 6–22)
BUN: 28 mg/dL — ABNORMAL HIGH (ref 7–25)
CO2: 21 mmol/L (ref 20–32)
Calcium: 9 mg/dL (ref 8.6–10.4)
Chloride: 104 mmol/L (ref 98–110)
Creat: 3.48 mg/dL — ABNORMAL HIGH (ref 0.50–1.05)
Globulin: 2.7 g/dL (ref 1.9–3.7)
Glucose, Bld: 85 mg/dL (ref 65–99)
Potassium: 4.5 mmol/L (ref 3.5–5.3)
Sodium: 137 mmol/L (ref 135–146)
Total Bilirubin: 0.4 mg/dL (ref 0.2–1.2)
Total Protein: 5.6 g/dL — ABNORMAL LOW (ref 6.1–8.1)
eGFR: 14 mL/min/{1.73_m2} — ABNORMAL LOW

## 2024-07-23 LAB — CBC WITH DIFFERENTIAL/PLATELET
Absolute Lymphocytes: 1784 {cells}/uL (ref 850–3900)
Absolute Monocytes: 928 {cells}/uL (ref 200–950)
Basophils Absolute: 56 {cells}/uL (ref 0–200)
Basophils Relative: 0.7 %
Eosinophils Absolute: 328 {cells}/uL (ref 15–500)
Eosinophils Relative: 4.1 %
HCT: 34.8 % — ABNORMAL LOW (ref 35.9–46.0)
Hemoglobin: 10.9 g/dL — ABNORMAL LOW (ref 11.7–15.5)
MCH: 28.7 pg (ref 27.0–33.0)
MCHC: 31.3 g/dL — ABNORMAL LOW (ref 31.6–35.4)
MCV: 91.6 fL (ref 81.4–101.7)
MPV: 9.9 fL (ref 7.5–12.5)
Monocytes Relative: 11.6 %
Neutro Abs: 4904 {cells}/uL (ref 1500–7800)
Neutrophils Relative %: 61.3 %
Platelets: 471 10*3/uL — ABNORMAL HIGH (ref 140–400)
RBC: 3.8 Million/uL (ref 3.80–5.10)
RDW: 14.2 % (ref 11.0–15.0)
Total Lymphocyte: 22.3 %
WBC: 8 10*3/uL (ref 3.8–10.8)

## 2024-07-23 LAB — LIPID PANEL
Cholesterol: 83 mg/dL
HDL: 22 mg/dL — ABNORMAL LOW
LDL Cholesterol (Calc): 35 mg/dL
Non-HDL Cholesterol (Calc): 61 mg/dL
Total CHOL/HDL Ratio: 3.8 (calc)
Triglycerides: 190 mg/dL — ABNORMAL HIGH

## 2024-07-23 LAB — HEPATITIS C ANTIBODY: Hepatitis C Ab: NONREACTIVE

## 2024-07-24 NOTE — Progress Notes (Signed)
 "  Provider: Kadra Kohan FNP-C   Evangelyn Crouse C, NP  Patient Care Team: Rosalba Totty, Roxan BROCKS, NP as PCP - General (Family Medicine) [facility] Paci, Karina M, MD (Dermatology)  Extended Emergency Contact Information Primary Emergency Contact: Eggert,LEE Address: [address]          [place], [place] [address] [place] Mobile Phone: [phone] Relation: Spouse  Code Status: Full code Goals of care: Advanced Directive information    07/22/2024   10:20 AM  Advanced Directives  Does Patient Have a Medical Advance Directive? No  Would patient like information on creating a medical advance directive? No - Patient declined     Chief Complaint  Patient presents with   Establish Care    New patient appointment.    HPI:  Pt is a 64 y.o. female seen today to establish care for medical management of chronic diseases.  Has medical history of essential hypertension, hypothyroidism, collagenous colitis, hyperlipidemia, normocytic anemia, major depression and anxiety with panic attack disorder, herniated lumbar disks status post L4/L5 fusion on 04/23/2024.  She is here with her husband who is a retired engineer, civil (consulting).  She provides HPI information. She is status post hospitalization from June 26, 2014 2026 to July 14, 2024 was discharged to rehab facility.  She was recently discharged from rehab 4 days ago.  She presented to the hospital with several weeks of persistent altered mental status and a few weeks of severely Decreased oral intake.  After lumbar surgery in October she had complication from prolonged anesthesia event over 3 hours she had significant decline in functional status and has not returned to her baseline.  She requires assistance with activities of daily living.  But prior to this she was independent with functional.  In the ED she hypertensive and tachycardia at 133/108 and heart rate 100 - 110 and was also tachypneic but afebrile and oxygen  saturations were stable on room air.  Her lab work showed severe hypokalemia with calcium  level above 15 also hypokalemic kalemia potassium of 2.5 and mild metabolic alkalosis with a bicarb of 34, acute kidney injury creatinine 1.39 up from baseline of 0.7-0.8.  Also had mild elevated AST which was 56 with normal alkaline phosphatase.  Her lipase, ALT and bilirubin were normal.  Her CBC was within normal range.  She was negative for flu COVID-19 and RSV.  Her chest x-ray showed no acute abnormalities.  EKG showed sinus tachycardia with normal QT interval.  Signs and symptoms were thought due to immobilization in combination with thiazide diuretic versus parathyroid abnormality malignancy and possible adrenal insufficiency.  However vitamin D  and TSH was normal.  Low suspicion of adrenal insufficiency since she had normal blood pressures she was on chronically oral steroid.  Her steroid was held for 3 months 2 L IV bolus of normal saline followed by 200 mL an hour IV fluids was given zoledronic  acid 4 mg IV and calcipotriol 4 units/kg twice daily for 4 doses was also given and nephrology consult was recommended if PTH is low.  Her hypokalemia was thought due to poor oral intake and thiazide diuretic use she received 20 mEq IV potassium and magnesium  was 2.0 potassium was repleted.  Her blood pressure normalized and Thiazide diuretic was discontinued and her other antihypertensive olmesartan  and Nebivolol  was also held.  TSH was 1.94 her levothyroxine  at 100 mcg daily was continued.  Benzodiazepine were held since she was not taking it at home.  Husband reported  no success with SSRIs but Wellbutrin) so doxepin have worked it otherwise shows significant improvement but still significant anxious diazepam  was resumed. Husband states patient takes alprazolam  0.5 mg for anxiety as needed and Valium  5 mg every 12 hours for muscle spasm.  Request medication refill. State blood pressure at home has been running within  normal range brought blood pressure log readings ranging 118/70 to 122/78 with normal heart rate. Kappa free light chain 49.0, lambda free light chain 48.4 and kappa lambda light chain ratio was normal.  Outpatient follow-up was recommended but husband declines.  Since Kappa,Lambda light chain ratio was normal 1.00 would like lab monitored in 1 year.  He declines referral to oncology for further evaluation.   Past Medical History:  Diagnosis Date   Anemia    Anxiety    Collagenous colitis    Depression    Heart murmur    Hyperlipidemia    Hypertension    Hypothyroidism    Insomnia    Lumbar herniated disc    Panic disorder    Past Surgical History:  Procedure Laterality Date   BACK SURGERY     2007   HERNIA REPAIR     TUBAL LIGATION      Allergies[1]  Allergies as of 07/22/2024       Reactions   Codeine Other (See Comments)   Syncope knocks me to the floor   Epinephrine  Other (See Comments)   Oral seizure-like activity   Azithromycin Rash   Iodine Swelling, Rash        Medication List        Accurate as of July 22, 2024 11:59 PM. If you have any questions, ask your nurse or doctor.          PAUSE taking these medications    budesonide  3 MG 24 hr capsule Wait to take this until your doctor or other care provider tells you to start again. Commonly known as: ENTOCORT EC  Take 2 capsules (6 mg total) by mouth daily.   diazepam  5 MG tablet Wait to take this until your doctor or other care provider tells you to start again. Commonly known as: VALIUM  Take 1 tablet (5 mg total) by mouth every 12 (twelve) hours as needed for anxiety. What changed: when to take this       STOP taking these medications    nebivolol  10 MG tablet Wait to take this until your doctor or other care provider tells you to start again. Commonly known as: BYSTOLIC  Stopped by: Roxan Plough, NP   olmesartan  20 MG tablet Wait to take this until your doctor or other care  provider tells you to start again. Commonly known as: BENICAR  Stopped by: Roxan Plough, NP       TAKE these medications    ALPRAZolam  0.5 MG tablet Commonly known as: Xanax  Take 1 tablet (0.5 mg total) by mouth at bedtime as needed for anxiety.   celecoxib  200 MG capsule Commonly known as: CELEBREX  Take 1 capsule (200 mg total) by mouth daily.   cyclobenzaprine  5 MG tablet Commonly known as: FLEXERIL  Take 1 tablet (5 mg total) by mouth 3 (three) times daily as needed for muscle spasms. What changed: when to take this   doxycycline  100 MG tablet Commonly known as: VIBRA -TABS Take 1 tablet (100 mg total) by mouth 2 (two) times daily for 7 days. Started by: Kailash Hinze, NP   fluticasone  50 MCG/ACT nasal spray Commonly known as: FLONASE  Place 1 spray into both  nostrils daily as needed for allergies or rhinitis.   HYDROcodone -acetaminophen  5-325 MG tablet Commonly known as: NORCO/VICODIN Take 1 tablet by mouth daily as needed for moderate pain (pain score 4-6).   Hydrogel Gel Apply topically as directed every 3 (three) days. Started by: Generic Mychart   levothyroxine  100 MCG tablet Commonly known as: SYNTHROID  Take 1 tablet by mouth once daily   Magnesium  Oxide 250 MG Tabs Take 250 mg by mouth at bedtime.   multivitamin with minerals Tabs tablet Take 1 tablet by mouth in the morning.   potassium chloride  SA 20 MEQ tablet Commonly known as: KLOR-CON  M Take 1 tablet (20 mEq total) by mouth daily.   rosuvastatin  40 MG tablet Commonly known as: Crestor  Take 1 tablet (40 mg total) by mouth daily. What changed: when to take this   torsemide  20 MG tablet Commonly known as: DEMADEX  Take 2 tablets by mouth daily x 3 days then one by mouth daily as needed Started by: Analiza Cowger, NP   VITAMIN D3 PO Take 2,000 Units by mouth at bedtime.        Review of Systems  Constitutional:  Negative for appetite change, chills, fatigue, fever and unexpected weight  change.  HENT:  Negative for congestion, dental problem, ear discharge, ear pain, facial swelling, hearing loss, nosebleeds, postnasal drip, rhinorrhea, sinus pressure, sinus pain, sneezing, sore throat, tinnitus and trouble swallowing.   Eyes:  Negative for pain, discharge, redness, itching and visual disturbance.  Respiratory:  Negative for cough, chest tightness, shortness of breath and wheezing.   Cardiovascular:  Positive for leg swelling. Negative for chest pain and palpitations.  Gastrointestinal:  Negative for abdominal distention, abdominal pain, blood in stool, constipation, diarrhea, nausea and vomiting.  Endocrine: Negative for cold intolerance, heat intolerance, polydipsia, polyphagia and polyuria.  Genitourinary:  Negative for difficulty urinating, dysuria, flank pain, frequency and urgency.  Musculoskeletal:  Positive for arthralgias, back pain and gait problem. Negative for joint swelling, myalgias, neck pain and neck stiffness.       Back brace in place   Skin:  Negative for color change, pallor, rash and wound.  Neurological:  Negative for dizziness, syncope, speech difficulty, weakness, light-headedness, numbness and headaches.  Hematological:  Does not bruise/bleed easily.  Psychiatric/Behavioral:  Negative for agitation, behavioral problems, confusion, hallucinations, self-injury, sleep disturbance and suicidal ideas. The patient is nervous/anxious.     Immunization History  Administered Date(s) Administered   Hepatitis A 07/04/1999, 07/13/2000   Hepatitis B 08/01/2002, 09/24/2002, 02/25/2003   Influenza, Seasonal, Injecte, Preservative Fre 05/05/2016   Influenza-Unspecified 03/26/2018, 02/23/2022, 03/28/2023, 04/04/2024   MMR 07/04/1999   Pneumococcal-Unspecified 05/16/2023   Polio, Unspecified 07/04/1999, 08/01/1999   RSV,unspecified 02/23/2022   Td (Adult),5 Lf Tetanus Toxid, Preservative Free 06/15/2011, 10/20/2021, 04/21/2022   Tdap 06/15/2011, 10/20/2021    Unspecified SARS-COV-2 Vaccination 09/12/2019, 10/03/2019, 04/12/2020, 01/11/2021, 05/18/2021, 05/03/2022, 03/28/2023, 04/04/2024   Varicella 07/13/2011   Yellow Fever 07/04/1999, 06/15/2011   Zoster Recombinant(Shingrix) 08/02/2021, 09/26/2021   Pertinent  Health Maintenance Due  Topic Date Due   Mammogram  05/30/2026   Colonoscopy  06/05/2029   Influenza Vaccine  Completed      08/24/2023   10:46 AM 01/22/2024    4:33 PM 07/22/2024   10:19 AM  Fall Risk  Falls in the past year? 1 0 1  Was there an injury with Fall? 1  0  1  Fall Risk Category Calculator 2 0 3  Patient at Risk for Falls Due to   No  Fall Risks  Fall risk Follow up   Falls evaluation completed     Data saved with a previous flowsheet row definition   Functional Status Survey:    Vitals:   07/22/24 1025  BP: 132/82  Pulse: (!) 102  Temp: 98.8 F (37.1 C)  SpO2: 93%  Weight: 193 lb 9.6 oz (87.8 kg)  Height: 4' 10 (1.473 m)   Body mass index is 40.46 kg/m. Physical Exam Vitals reviewed.  Constitutional:      General: She is not in acute distress.    Appearance: Normal appearance. She is obese. She is not ill-appearing or diaphoretic.  HENT:     Head: Normocephalic.     Right Ear: Tympanic membrane, ear canal and external ear normal. There is no impacted cerumen.     Left Ear: Tympanic membrane, ear canal and external ear normal. There is no impacted cerumen.     Nose: Nose normal. No congestion or rhinorrhea.     Mouth/Throat:     Mouth: Mucous membranes are moist.     Pharynx: Oropharynx is clear. No oropharyngeal exudate or posterior oropharyngeal erythema.  Eyes:     General: No scleral icterus.       Right eye: No discharge.        Left eye: No discharge.     Extraocular Movements: Extraocular movements intact.     Conjunctiva/sclera: Conjunctivae normal.     Pupils: Pupils are equal, round, and reactive to light.  Neck:     Vascular: No carotid bruit.  Cardiovascular:     Rate and Rhythm:  Normal rate and regular rhythm.     Pulses: Normal pulses.     Heart sounds: Normal heart sounds. No murmur heard.    No friction rub. No gallop.  Pulmonary:     Effort: Pulmonary effort is normal. No respiratory distress.     Breath sounds: Normal breath sounds. No wheezing, rhonchi or rales.  Chest:     Chest wall: No tenderness.  Abdominal:     General: Bowel sounds are normal. There is no distension.     Palpations: Abdomen is soft. There is no mass.     Tenderness: There is no abdominal tenderness. There is no right CVA tenderness, left CVA tenderness, guarding or rebound.  Musculoskeletal:        General: No swelling or tenderness. Normal range of motion.     Cervical back: Normal range of motion. No rigidity or tenderness.     Right lower leg: Edema present.     Left lower leg: Edema present.  Lymphadenopathy:     Cervical: No cervical adenopathy.  Skin:    General: Skin is warm and dry.     Coloration: Skin is not pale.     Findings: Erythema present. No bruising, lesion or rash.     Comments: Multiple wounds on right shin area weeping with diffuse erythema surround skin tissue tender to touch.   Neurological:     Mental Status: She is alert and oriented to person, place, and time.     Cranial Nerves: No cranial nerve deficit.     Sensory: No sensory deficit.     Motor: No weakness.     Coordination: Coordination normal.     Gait: Gait abnormal.  Psychiatric:        Mood and Affect: Mood normal.        Speech: Speech normal.        Behavior: Behavior normal.  Thought Content: Thought content normal.        Judgment: Judgment normal.     Labs reviewed: Recent Labs    05/09/24 1457 05/10/24 0311 05/11/24 0811 07/11/24 0851 07/11/24 1032 07/12/24 0708 07/13/24 0254 07/14/24 0312 07/17/24 1115 07/22/24 1204  NA  --  131*   < >  --    < > 138   < > 136 136 137  K  --  3.8   < >  --    < > 3.5   < > 2.9* 3.4* 4.5  CL  --  95*   < >  --    < > 101   < >  102 100 104  CO2  --  24   < >  --    < > 30   < > 22 19* 21  GLUCOSE  --  113*   < >  --    < > 117*   < > 79 101* 85  BUN  --  12   < >  --    < > 13   < > 12 18 28*  CREATININE  --  0.82   < >  --    < > 1.26*   < > 1.08* 2.26* 3.48*  CALCIUM   --  9.6   < > 14.3*   < > 12.4*   < > 9.1 9.2 9.0  MG 1.4* 2.5*  --  2.0  --   --   --   --   --   --   PHOS  --   --   --   --   --  1.5*  --   --   --   --    < > = values in this interval not displayed.   Recent Labs    07/11/24 1602 07/12/24 0708 07/13/24 0254 07/22/24 1204  AST 44* 49* 58* 40*  ALT 17 19 21 19   ALKPHOS 107 114 102  --   BILITOT 0.4 0.4 0.3 0.4  PROT 5.3* 5.6* 5.4* 5.6*  ALBUMIN  2.7* 3.0* 2.7*  --    Recent Labs    05/15/24 0442 05/16/24 0612 07/12/24 0708 07/13/24 0254 07/14/24 0312 07/22/24 1204  WBC 18.7*   < > 6.4 7.1 7.3 8.0  NEUTROABS 14.9*  --  4.4  --   --  4,904  HGB 10.2*   < > 11.0* 10.2* 10.4* 10.9*  HCT 30.9*   < > 33.7* 31.4* 32.0* 34.8*  MCV 95.7   < > 91.1 90.2 89.9 91.6  PLT 242   < > 255 222 217 471*   < > = values in this interval not displayed.   Lab Results  Component Value Date   TSH 1.940 07/11/2024   Lab Results  Component Value Date   HGBA1C CANCELED 05/27/2024   Lab Results  Component Value Date   CHOL 83 07/22/2024   HDL 22 (L) 07/22/2024   LDLCALC 35 07/22/2024   TRIG 190 (H) 07/22/2024   CHOLHDL 3.8 07/22/2024    Significant Diagnostic Results in last 30 days:  CT CHEST ABDOMEN PELVIS WO CONTRAST Result Date: 07/13/2024 CLINICAL DATA:  Nodule in the right lung apex. Concern for occult malignancy. EXAM: CT CHEST, ABDOMEN AND PELVIS WITHOUT CONTRAST TECHNIQUE: Multidetector CT imaging of the chest, abdomen and pelvis was performed following the standard protocol without IV contrast. RADIATION DOSE REDUCTION: This exam was performed according to the departmental dose-optimization program  which includes automated exposure control, adjustment of the mA and/or kV according  to patient size and/or use of iterative reconstruction technique. COMPARISON:  Chest radiograph dated 07/11/2024 and CT dated 05/08/2024. FINDINGS: Evaluation of this exam is limited in the absence of intravenous contrast. CT CHEST FINDINGS Cardiovascular: There is no cardiomegaly or pericardial effusion. Mild atherosclerotic calcification of the thoracic aorta. No aneurysmal dilatation. The central pulmonary arteries are grossly unremarkable. Mediastinum/Nodes: No hilar or mediastinal adenopathy. The esophagus is grossly unremarkable. No mediastinal fluid collection. Lungs/Pleura: Trace right and possibly left pleural effusions. There is minimal bibasilar subpleural dependent atelectasis. Bilateral lower lung field reticular densities likely atelectasis/scarring. The previously described right apical ground-glass nodule is not identified on today's exam. No consolidative changes. No pneumothorax. The central airways are patent. Musculoskeletal: No acute osseous pathology. CT ABDOMEN PELVIS FINDINGS No intra-abdominal free air or free fluid. Hepatobiliary: Fatty liver. No biliary dilatation. Cholecystectomy. Pancreas: Unremarkable. No pancreatic ductal dilatation or surrounding inflammatory changes. Spleen: Normal in size without focal abnormality. Adrenals/Urinary Tract: The adrenal glands are unremarkable. The kidneys, visualized ureters, and urinary bladder appear unremarkable. Stomach/Bowel: There is no bowel obstruction or active inflammation. The appendix is not visualized with certainty. No inflammatory changes identified in the right lower quadrant. Vascular/Lymphatic: Mild aortoiliac atherosclerotic disease. The IVC is unremarkable. No portal venous gas. There is no adenopathy. Reproductive: The uterus is grossly unremarkable. No suspicious adnexal masses. Other: None Musculoskeletal: Lower lumbar posterior fusion. No acute osseous pathology. IMPRESSION: 1. No acute intrathoracic, abdominal, or pelvic  pathology. No evidence of malignancy. 2. Trace right and possibly left pleural effusions. 3. Fatty liver. 4.  Aortic Atherosclerosis (ICD10-I70.0). Electronically Signed   By: Vanetta Chou M.D.   On: 07/13/2024 17:12   DG Chest Portable 1 View Result Date: 07/11/2024 CLINICAL DATA:  Cough EXAM: PORTABLE CHEST 1 VIEW COMPARISON:  May 14, 2024 FINDINGS: Stable cardiomediastinal silhouette. Both lungs are clear. The visualized skeletal structures are unremarkable. IMPRESSION: No active disease. Electronically Signed   By: Lynwood Landy Raddle M.D.   On: 07/11/2024 07:47    Assessment/Plan 1. Essential hypertension (Primary) B/p well controlled at goal at home  - current off blood pressure medication discontinued during recent hospitalization  - continue to monitor B/p at home and notify provider if > 140/90  - CBC with Differential/Platelet - CMP  2. Hypercholesterolemia - dietary modification advised.Exercise limited due to her current immobility condition post hospitalization  - continue on Rosuvastatin   - Lipid panel  3. Hypothyroidism, Acquired  Lab Results  Component Value Date   TSH 1.940 07/11/2024  - continue on Levothyroxine   - continue to monitor TSH level   4. Primary osteoarthritis involving multiple joints Continue on current pain regimen  - Ambulatory referral to Home Health PT   5. GAD (generalized anxiety disorder) Continue on Alprazolam   - Non-narcotic use contract signed   6. Unsteady gait On wheelchair during visit and has walker at home  - Ambulatory referral to Home Health PT for ROM,exercise,gait stability and muscle strengthening   7. Frequent falls Fall and safety precaution advised  - Ambulatory referral to Home Health for PT   8. Encounter for hepatitis C screening test for low risk patient Low risk  - Hepatitis C antibody  9. Wound of right lower extremity, initial encounter Afebrile  Multiple wounds on right shin area weeping with diffuse  erythema surround skin tissue tender to touch. Will refer to [facility] for wound care management  - start on Doxycycline .side effects  discussed  - Ambulatory referral to Home Health - doxycycline  (VIBRA -TABS) 100 MG tablet; Take 1 tablet (100 mg total) by mouth 2 (two) times daily for 7 days.  Dispense: 14 tablet; Refill: 0  10. Encounter to establish care Up date on health screening excepts due for PNA and cervical screening.will need to obtain previous records then update.   11. Chronic pain syndrome Continue on current pain regimen - Ambulatory referral to Home Health  12. Bilateral lower extremity edema Start on Torsemide  as below then PRN due to declining CKD 2.46  - Ambulatory referral to Home Health - torsemide  (DEMADEX ) 20 MG tablet; Take 2 tablets by mouth daily x 3 days then one by mouth daily as needed  Dispense: 30 tablet; Refill: 3 - potassium chloride  SA (KLOR-CON  M) 20 MEQ tablet; Take 1 tablet (20 mEq total) by mouth daily.  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   14. Stage 3b chronic kidney disease (HCC) CR.2.46  Will recheck CMP - Ambulatory referral to Nephrology  Family/ staff Communication: Reviewed plan of care with patient and Husband verbalized understanding   Labs/tests ordered:  - CBC with Differential/Platelet - CMP with eGFR([facility]) - Lipid panel - Hep C Antibody  Next Appointment : Return in about 6 months (around 01/19/2025) for medical mangement of chronic issues. 2weeks for lower extremity edema and BMP.   Spent 50 minutes of Face to face and non-face to face with patient  >50% time spent counseling; reviewing medical record; tests; labs; documentation and developing future plan of care.   Roxan BROCKS Jonathan Corpus, NP      [1]  Allergies Allergen Reactions   Codeine Other (See Comments)    Syncope knocks me to the floor   Epinephrine  Other (See Comments)     Oral seizure-like activity   Azithromycin Rash   Iodine Swelling and Rash   "

## 2024-07-31 ENCOUNTER — Telehealth: Payer: Self-pay

## 2024-07-31 NOTE — Telephone Encounter (Signed)
 Copied from CRM [id number]. Topic: General - Other >> Jul 30, 2024  4:09 PM Graeme ORN wrote: Reason for CRM: Received a call from [facility] about patient - leave a message for provider that start of care for PT is delayed until Friday. Thank You

## 2024-08-06 ENCOUNTER — Ambulatory Visit: Admitting: Family

## 2024-11-12 ENCOUNTER — Ambulatory Visit: Admitting: Dermatology

## 2025-01-19 ENCOUNTER — Ambulatory Visit: Admitting: Family
